# Patient Record
Sex: Female | Born: 1965
Health system: Southern US, Community
[De-identification: ages and names within clinical notes are randomized; demographics above are authoritative.]

## PROBLEM LIST (undated history)

## (undated) DIAGNOSIS — M797 Fibromyalgia: Secondary | ICD-10-CM

## (undated) DIAGNOSIS — Z8639 Personal history of other endocrine, nutritional and metabolic disease: Secondary | ICD-10-CM

## (undated) DIAGNOSIS — L309 Dermatitis, unspecified: Secondary | ICD-10-CM

## (undated) DIAGNOSIS — R0600 Dyspnea, unspecified: Secondary | ICD-10-CM

## (undated) DIAGNOSIS — R739 Hyperglycemia, unspecified: Secondary | ICD-10-CM

## (undated) DIAGNOSIS — N979 Female infertility, unspecified: Secondary | ICD-10-CM

## (undated) DIAGNOSIS — E079 Disorder of thyroid, unspecified: Secondary | ICD-10-CM

## (undated) DIAGNOSIS — F419 Anxiety disorder, unspecified: Secondary | ICD-10-CM

## (undated) DIAGNOSIS — D649 Anemia, unspecified: Secondary | ICD-10-CM

## (undated) DIAGNOSIS — K219 Gastro-esophageal reflux disease without esophagitis: Secondary | ICD-10-CM

## (undated) HISTORY — DX: Disorder of thyroid, unspecified: E07.9

## (undated) HISTORY — DX: Anxiety disorder, unspecified: F41.9

## (undated) HISTORY — DX: Hyperglycemia, unspecified: R73.9

## (undated) HISTORY — DX: Dyspnea, unspecified: R06.00

## (undated) HISTORY — DX: Female infertility, unspecified: N97.9

## (undated) HISTORY — DX: Dermatitis, unspecified: L30.9

## (undated) HISTORY — DX: Personal history of other endocrine, nutritional and metabolic disease: Z86.39

## (undated) HISTORY — DX: Gastro-esophageal reflux disease without esophagitis: K21.9

## (undated) HISTORY — DX: Fibromyalgia: M79.7

## (undated) HISTORY — DX: Anemia, unspecified: D64.9

---

## 1997-01-05 HISTORY — PX: PELVIC LAPAROSCOPY: SHX162

## 1997-10-28 ENCOUNTER — Emergency Department (HOSPITAL_COMMUNITY): Admission: EM | Admit: 1997-10-28 | Discharge: 1997-10-28 | Payer: Self-pay | Admitting: Family Medicine

## 1997-11-27 ENCOUNTER — Emergency Department (HOSPITAL_COMMUNITY): Admission: EM | Admit: 1997-11-27 | Discharge: 1997-11-27 | Payer: Self-pay | Admitting: Emergency Medicine

## 1997-11-27 ENCOUNTER — Encounter: Payer: Self-pay | Admitting: Emergency Medicine

## 1998-01-31 ENCOUNTER — Emergency Department (HOSPITAL_COMMUNITY): Admission: EM | Admit: 1998-01-31 | Discharge: 1998-01-31 | Payer: Self-pay | Admitting: Emergency Medicine

## 1998-03-18 ENCOUNTER — Ambulatory Visit (HOSPITAL_COMMUNITY): Admission: RE | Admit: 1998-03-18 | Discharge: 1998-03-18 | Payer: Self-pay | Admitting: Family Medicine

## 2000-02-15 ENCOUNTER — Emergency Department (HOSPITAL_COMMUNITY): Admission: EM | Admit: 2000-02-15 | Discharge: 2000-02-15 | Payer: Self-pay | Admitting: Emergency Medicine

## 2000-02-16 ENCOUNTER — Other Ambulatory Visit: Admission: RE | Admit: 2000-02-16 | Discharge: 2000-02-16 | Payer: Self-pay | Admitting: Obstetrics & Gynecology

## 2000-03-16 ENCOUNTER — Ambulatory Visit (HOSPITAL_COMMUNITY): Admission: RE | Admit: 2000-03-16 | Discharge: 2000-03-16 | Payer: Self-pay | Admitting: Interventional Radiology

## 2000-04-08 ENCOUNTER — Ambulatory Visit (HOSPITAL_COMMUNITY): Admission: RE | Admit: 2000-04-08 | Discharge: 2000-04-08 | Payer: Self-pay | Admitting: Obstetrics & Gynecology

## 2000-04-08 ENCOUNTER — Encounter: Payer: Self-pay | Admitting: Obstetrics & Gynecology

## 2001-01-05 ENCOUNTER — Emergency Department (HOSPITAL_COMMUNITY): Admission: EM | Admit: 2001-01-05 | Discharge: 2001-01-05 | Payer: Self-pay | Admitting: *Deleted

## 2001-01-05 ENCOUNTER — Encounter: Payer: Self-pay | Admitting: Emergency Medicine

## 2001-04-11 ENCOUNTER — Ambulatory Visit: Admission: RE | Admit: 2001-04-11 | Discharge: 2001-04-11 | Payer: Self-pay | Admitting: Pulmonary Disease

## 2001-07-20 ENCOUNTER — Encounter: Payer: Self-pay | Admitting: Emergency Medicine

## 2001-07-20 ENCOUNTER — Emergency Department (HOSPITAL_COMMUNITY): Admission: EM | Admit: 2001-07-20 | Discharge: 2001-07-21 | Payer: Self-pay | Admitting: Emergency Medicine

## 2002-01-05 HISTORY — PX: ABDOMINAL HYSTERECTOMY: SHX81

## 2002-10-02 ENCOUNTER — Other Ambulatory Visit: Admission: RE | Admit: 2002-10-02 | Discharge: 2002-10-02 | Payer: Self-pay | Admitting: Internal Medicine

## 2003-03-19 ENCOUNTER — Other Ambulatory Visit: Admission: RE | Admit: 2003-03-19 | Discharge: 2003-03-19 | Payer: Self-pay | Admitting: Obstetrics & Gynecology

## 2003-09-06 ENCOUNTER — Encounter: Admission: RE | Admit: 2003-09-06 | Discharge: 2003-09-06 | Payer: Self-pay | Admitting: Obstetrics & Gynecology

## 2004-01-17 ENCOUNTER — Ambulatory Visit: Payer: Self-pay | Admitting: Internal Medicine

## 2004-02-06 ENCOUNTER — Ambulatory Visit: Payer: Self-pay | Admitting: Critical Care Medicine

## 2004-02-18 ENCOUNTER — Ambulatory Visit: Payer: Self-pay | Admitting: Critical Care Medicine

## 2004-02-27 ENCOUNTER — Ambulatory Visit: Payer: Self-pay | Admitting: Internal Medicine

## 2004-03-10 ENCOUNTER — Ambulatory Visit: Payer: Self-pay | Admitting: Critical Care Medicine

## 2004-06-04 ENCOUNTER — Ambulatory Visit: Payer: Self-pay | Admitting: Internal Medicine

## 2004-08-18 ENCOUNTER — Ambulatory Visit (HOSPITAL_COMMUNITY): Admission: RE | Admit: 2004-08-18 | Discharge: 2004-08-18 | Payer: Self-pay | Admitting: Gastroenterology

## 2004-10-14 ENCOUNTER — Ambulatory Visit: Payer: Self-pay | Admitting: Internal Medicine

## 2004-10-23 ENCOUNTER — Ambulatory Visit: Payer: Self-pay | Admitting: Internal Medicine

## 2004-10-23 ENCOUNTER — Encounter: Admission: RE | Admit: 2004-10-23 | Discharge: 2004-10-23 | Payer: Self-pay | Admitting: Internal Medicine

## 2004-10-27 ENCOUNTER — Ambulatory Visit: Payer: Self-pay | Admitting: Internal Medicine

## 2004-11-03 ENCOUNTER — Ambulatory Visit: Payer: Self-pay | Admitting: Internal Medicine

## 2004-11-04 ENCOUNTER — Encounter: Admission: RE | Admit: 2004-11-04 | Discharge: 2004-11-04 | Payer: Self-pay | Admitting: Internal Medicine

## 2004-11-10 ENCOUNTER — Ambulatory Visit: Payer: Self-pay | Admitting: Internal Medicine

## 2004-12-01 ENCOUNTER — Ambulatory Visit: Payer: Self-pay | Admitting: Critical Care Medicine

## 2005-01-12 ENCOUNTER — Ambulatory Visit: Payer: Self-pay | Admitting: Internal Medicine

## 2005-01-13 ENCOUNTER — Ambulatory Visit: Payer: Self-pay | Admitting: Internal Medicine

## 2005-05-15 ENCOUNTER — Emergency Department (HOSPITAL_COMMUNITY): Admission: EM | Admit: 2005-05-15 | Discharge: 2005-05-15 | Payer: Self-pay | Admitting: Emergency Medicine

## 2005-05-15 ENCOUNTER — Ambulatory Visit: Payer: Self-pay | Admitting: Critical Care Medicine

## 2005-05-16 ENCOUNTER — Ambulatory Visit: Payer: Self-pay | Admitting: Internal Medicine

## 2005-07-25 ENCOUNTER — Ambulatory Visit: Payer: Self-pay | Admitting: Family Medicine

## 2005-08-11 ENCOUNTER — Ambulatory Visit: Payer: Self-pay | Admitting: Internal Medicine

## 2005-09-21 ENCOUNTER — Ambulatory Visit: Payer: Self-pay | Admitting: Internal Medicine

## 2005-10-16 ENCOUNTER — Ambulatory Visit: Payer: Self-pay | Admitting: Internal Medicine

## 2005-10-19 ENCOUNTER — Ambulatory Visit: Payer: Self-pay | Admitting: Licensed Clinical Social Worker

## 2005-10-28 ENCOUNTER — Ambulatory Visit: Payer: Self-pay | Admitting: Licensed Clinical Social Worker

## 2005-11-02 ENCOUNTER — Ambulatory Visit: Payer: Self-pay | Admitting: Licensed Clinical Social Worker

## 2005-11-16 ENCOUNTER — Ambulatory Visit: Payer: Self-pay | Admitting: Licensed Clinical Social Worker

## 2005-11-23 ENCOUNTER — Ambulatory Visit: Payer: Self-pay | Admitting: Licensed Clinical Social Worker

## 2005-11-25 ENCOUNTER — Ambulatory Visit: Payer: Self-pay | Admitting: Family Medicine

## 2005-12-01 ENCOUNTER — Ambulatory Visit: Payer: Self-pay | Admitting: Licensed Clinical Social Worker

## 2005-12-09 ENCOUNTER — Ambulatory Visit: Payer: Self-pay | Admitting: Licensed Clinical Social Worker

## 2005-12-16 ENCOUNTER — Ambulatory Visit: Payer: Self-pay | Admitting: Licensed Clinical Social Worker

## 2005-12-23 ENCOUNTER — Ambulatory Visit: Payer: Self-pay | Admitting: Licensed Clinical Social Worker

## 2005-12-31 ENCOUNTER — Ambulatory Visit: Payer: Self-pay | Admitting: Licensed Clinical Social Worker

## 2006-01-11 ENCOUNTER — Ambulatory Visit: Payer: Self-pay | Admitting: Licensed Clinical Social Worker

## 2006-01-25 ENCOUNTER — Ambulatory Visit: Payer: Self-pay | Admitting: Licensed Clinical Social Worker

## 2006-01-28 ENCOUNTER — Ambulatory Visit: Payer: Self-pay | Admitting: Critical Care Medicine

## 2006-02-02 ENCOUNTER — Ambulatory Visit: Payer: Self-pay | Admitting: Internal Medicine

## 2006-02-03 ENCOUNTER — Encounter: Payer: Self-pay | Admitting: Internal Medicine

## 2006-02-04 ENCOUNTER — Encounter: Payer: Self-pay | Admitting: Critical Care Medicine

## 2006-02-04 ENCOUNTER — Ambulatory Visit: Payer: Self-pay | Admitting: Cardiology

## 2006-02-05 ENCOUNTER — Ambulatory Visit: Payer: Self-pay | Admitting: Internal Medicine

## 2006-02-17 ENCOUNTER — Encounter: Payer: Self-pay | Admitting: Internal Medicine

## 2006-03-02 ENCOUNTER — Encounter: Payer: Self-pay | Admitting: Internal Medicine

## 2006-05-05 ENCOUNTER — Ambulatory Visit: Payer: Self-pay | Admitting: Internal Medicine

## 2006-10-21 ENCOUNTER — Ambulatory Visit: Payer: Self-pay | Admitting: Internal Medicine

## 2006-10-21 DIAGNOSIS — Z860100 Personal history of colon polyps, unspecified: Secondary | ICD-10-CM | POA: Insufficient documentation

## 2006-10-21 DIAGNOSIS — R0989 Other specified symptoms and signs involving the circulatory and respiratory systems: Secondary | ICD-10-CM | POA: Insufficient documentation

## 2006-10-21 DIAGNOSIS — R0609 Other forms of dyspnea: Secondary | ICD-10-CM | POA: Insufficient documentation

## 2006-11-20 ENCOUNTER — Ambulatory Visit: Payer: Self-pay | Admitting: Family Medicine

## 2006-11-20 LAB — CONVERTED CEMR LAB
Glucose, Urine, Semiquant: NEGATIVE
Nitrite: NEGATIVE
Urobilinogen, UA: 1
pH: 6.5

## 2007-02-25 ENCOUNTER — Telehealth: Payer: Self-pay | Admitting: Internal Medicine

## 2007-03-03 ENCOUNTER — Encounter: Payer: Self-pay | Admitting: Internal Medicine

## 2007-04-06 ENCOUNTER — Encounter: Payer: Self-pay | Admitting: Internal Medicine

## 2007-06-09 ENCOUNTER — Ambulatory Visit: Payer: Self-pay | Admitting: Internal Medicine

## 2007-06-09 LAB — CONVERTED CEMR LAB
Glucose, Urine, Semiquant: NEGATIVE
Protein, U semiquant: NEGATIVE
WBC Urine, dipstick: NEGATIVE

## 2007-09-20 ENCOUNTER — Ambulatory Visit: Payer: Self-pay | Admitting: Critical Care Medicine

## 2007-09-20 DIAGNOSIS — J8410 Pulmonary fibrosis, unspecified: Secondary | ICD-10-CM | POA: Insufficient documentation

## 2007-09-22 ENCOUNTER — Encounter: Payer: Self-pay | Admitting: Critical Care Medicine

## 2007-09-22 ENCOUNTER — Ambulatory Visit: Payer: Self-pay | Admitting: Cardiology

## 2007-09-23 ENCOUNTER — Encounter: Payer: Self-pay | Admitting: Critical Care Medicine

## 2007-09-27 ENCOUNTER — Ambulatory Visit: Payer: Self-pay | Admitting: Critical Care Medicine

## 2007-09-28 ENCOUNTER — Encounter: Payer: Self-pay | Admitting: Critical Care Medicine

## 2007-09-28 ENCOUNTER — Ambulatory Visit: Payer: Self-pay | Admitting: Internal Medicine

## 2007-09-29 ENCOUNTER — Telehealth: Payer: Self-pay | Admitting: Internal Medicine

## 2007-09-29 ENCOUNTER — Telehealth (INDEPENDENT_AMBULATORY_CARE_PROVIDER_SITE_OTHER): Payer: Self-pay | Admitting: *Deleted

## 2007-09-29 ENCOUNTER — Encounter: Admission: RE | Admit: 2007-09-29 | Discharge: 2007-09-29 | Payer: Self-pay | Admitting: Internal Medicine

## 2007-09-30 ENCOUNTER — Encounter: Payer: Self-pay | Admitting: Internal Medicine

## 2007-10-04 LAB — CONVERTED CEMR LAB
Basophils Absolute: 0 10*3/uL (ref 0.0–0.1)
Basophils Relative: 0.7 % (ref 0.0–3.0)
Eosinophils Absolute: 0.5 10*3/uL (ref 0.0–0.7)
MCHC: 34.4 g/dL (ref 30.0–36.0)
Monocytes Absolute: 0.5 10*3/uL (ref 0.1–1.0)
Monocytes Relative: 9 % (ref 3.0–12.0)
RDW: 12.3 % (ref 11.5–14.6)
TSH: 0.64 microintl units/mL (ref 0.35–5.50)
WBC: 5.9 10*3/uL (ref 4.5–10.5)

## 2007-10-18 ENCOUNTER — Ambulatory Visit: Payer: Self-pay | Admitting: Family Medicine

## 2007-10-18 LAB — CONVERTED CEMR LAB
Bilirubin Urine: NEGATIVE
Glucose, Urine, Semiquant: NEGATIVE
Ketones, urine, test strip: NEGATIVE
Nitrite: NEGATIVE
Urobilinogen, UA: 0.2
WBC Urine, dipstick: NEGATIVE
pH: 7

## 2007-10-19 ENCOUNTER — Telehealth: Payer: Self-pay | Admitting: Family Medicine

## 2007-10-19 ENCOUNTER — Ambulatory Visit: Payer: Self-pay | Admitting: Family Medicine

## 2007-10-20 ENCOUNTER — Encounter: Admission: RE | Admit: 2007-10-20 | Discharge: 2007-12-05 | Payer: Self-pay | Admitting: Internal Medicine

## 2007-10-20 ENCOUNTER — Encounter: Payer: Self-pay | Admitting: Internal Medicine

## 2007-10-20 LAB — CONVERTED CEMR LAB
Glucose, 1 Hour GTT: 149 mg/dL (ref 120–170)
Glucose, 2 hour: 98 mg/dL (ref 70–139)
Glucose, Fasting: 100 mg/dL — ABNORMAL HIGH (ref 70–99)
Glucose, GTT - 3 Hour: 108 mg/dL

## 2007-11-09 ENCOUNTER — Ambulatory Visit: Payer: Self-pay | Admitting: Internal Medicine

## 2007-11-09 LAB — CONVERTED CEMR LAB
Glucose, Urine, Semiquant: NEGATIVE
Nitrite: NEGATIVE
Specific Gravity, Urine: <1.005
Urobilinogen, UA: 0.2
pH: 5

## 2007-11-22 ENCOUNTER — Encounter: Payer: Self-pay | Admitting: Internal Medicine

## 2007-11-25 ENCOUNTER — Ambulatory Visit: Payer: Self-pay | Admitting: Family Medicine

## 2007-11-25 ENCOUNTER — Telehealth: Payer: Self-pay | Admitting: Family Medicine

## 2007-11-25 LAB — CONVERTED CEMR LAB
Bilirubin Urine: NEGATIVE
Ketones, urine, test strip: NEGATIVE
Specific Gravity, Urine: 1.01

## 2008-01-02 ENCOUNTER — Telehealth: Payer: Self-pay | Admitting: Internal Medicine

## 2008-01-07 ENCOUNTER — Ambulatory Visit: Payer: Self-pay | Admitting: Internal Medicine

## 2008-01-07 LAB — CONVERTED CEMR LAB
Nitrite: NEGATIVE
Protein, U semiquant: NEGATIVE
pH: 5

## 2008-01-08 ENCOUNTER — Emergency Department (HOSPITAL_COMMUNITY): Admission: EM | Admit: 2008-01-08 | Discharge: 2008-01-08 | Payer: Self-pay | Admitting: Emergency Medicine

## 2008-01-11 ENCOUNTER — Ambulatory Visit: Payer: Self-pay | Admitting: Internal Medicine

## 2008-01-11 DIAGNOSIS — K219 Gastro-esophageal reflux disease without esophagitis: Secondary | ICD-10-CM

## 2008-01-11 HISTORY — DX: Gastro-esophageal reflux disease without esophagitis: K21.9

## 2008-01-12 ENCOUNTER — Telehealth: Payer: Self-pay | Admitting: Internal Medicine

## 2008-01-18 ENCOUNTER — Ambulatory Visit: Payer: Self-pay | Admitting: Internal Medicine

## 2008-01-18 DIAGNOSIS — M797 Fibromyalgia: Secondary | ICD-10-CM

## 2008-01-18 HISTORY — DX: Fibromyalgia: M79.7

## 2008-01-18 LAB — CONVERTED CEMR LAB
Albumin: 4.4 g/dL (ref 3.5–5.2)
Alkaline Phosphatase: 49 units/L (ref 39–117)
BUN: 7 mg/dL (ref 6–23)
CO2: 31 meq/L (ref 19–32)
Eosinophils Absolute: 0.4 10*3/uL (ref 0.0–0.7)
Eosinophils Relative: 7.2 % — ABNORMAL HIGH (ref 0.0–5.0)
GFR calc Af Amer: 102 mL/min
Glucose, Bld: 94 mg/dL (ref 70–99)
HCT: 38.6 % (ref 36.0–46.0)
Hemoglobin: 13 g/dL (ref 12.0–15.0)
Lymphocytes Relative: 25.4 % (ref 12.0–46.0)
MCHC: 33.7 g/dL (ref 30.0–36.0)
Monocytes Relative: 9.6 % (ref 3.0–12.0)
Neutro Abs: 3 10*3/uL (ref 1.4–7.7)
Platelets: 177 10*3/uL (ref 150–400)
Potassium: 4 meq/L (ref 3.5–5.1)
RBC: 4.4 M/uL (ref 3.87–5.11)
Rhuematoid fact SerPl-aCnc: <20 intl units/mL — ABNORMAL LOW (ref 0.0–20.0)
Sodium: 139 meq/L (ref 135–145)
Total Protein: 7.9 g/dL (ref 6.0–8.3)
WBC: 5.2 10*3/uL (ref 4.5–10.5)

## 2008-01-20 ENCOUNTER — Encounter: Payer: Self-pay | Admitting: Internal Medicine

## 2008-01-23 ENCOUNTER — Telehealth: Payer: Self-pay | Admitting: Internal Medicine

## 2008-01-24 ENCOUNTER — Ambulatory Visit: Payer: Self-pay | Admitting: Internal Medicine

## 2008-01-30 ENCOUNTER — Telehealth: Payer: Self-pay | Admitting: Internal Medicine

## 2008-04-20 ENCOUNTER — Ambulatory Visit: Payer: Self-pay | Admitting: Internal Medicine

## 2008-04-20 LAB — CONVERTED CEMR LAB
BUN: 8 mg/dL (ref 6–23)
Calcium: 9.9 mg/dL (ref 8.4–10.5)
Folate: 11.3 ng/mL
GFR calc non Af Amer: 70.44 mL/min (ref >60)
Glucose, Bld: 112 mg/dL — ABNORMAL HIGH (ref 70–99)
Vitamin B-12: 1324 pg/mL — ABNORMAL HIGH (ref 211–911)

## 2008-04-24 ENCOUNTER — Ambulatory Visit: Payer: Self-pay | Admitting: Internal Medicine

## 2008-04-26 ENCOUNTER — Telehealth: Payer: Self-pay | Admitting: Internal Medicine

## 2008-06-01 ENCOUNTER — Telehealth: Payer: Self-pay | Admitting: Internal Medicine

## 2008-06-06 ENCOUNTER — Telehealth: Payer: Self-pay | Admitting: Internal Medicine

## 2010-01-26 ENCOUNTER — Encounter: Payer: Self-pay | Admitting: Rheumatology

## 2010-03-06 LAB — HM MAMMOGRAPHY: HM Mammogram: NEGATIVE

## 2010-04-21 LAB — DIFFERENTIAL
Basophils Relative: 1 % (ref 0–1)
Monocytes Absolute: 0.5 10*3/uL (ref 0.1–1.0)
Monocytes Relative: 6 % (ref 3–12)
Neutro Abs: 5.2 10*3/uL (ref 1.7–7.7)

## 2010-04-21 LAB — CBC
HCT: 40.1 % (ref 36.0–46.0)
Hemoglobin: 13.6 g/dL (ref 12.0–15.0)
MCHC: 33.8 g/dL (ref 30.0–36.0)
MCV: 86.5 fL (ref 78.0–100.0)
Platelets: 172 10*3/uL (ref 150–400)
RDW: 13.5 % (ref 11.5–15.5)

## 2010-04-21 LAB — POCT CARDIAC MARKERS
CKMB, poc: <1 ng/mL — ABNORMAL LOW (ref 1.0–8.0)
Myoglobin, poc: 47.5 ng/mL (ref 12–200)
Troponin i, poc: <0.05 ng/mL (ref 0.00–0.09)

## 2010-04-21 LAB — BASIC METABOLIC PANEL
BUN: 12 mg/dL (ref 6–23)
CO2: 25 mEq/L (ref 19–32)
GFR calc Af Amer: >60 mL/min (ref >60)
Glucose, Bld: 99 mg/dL (ref 70–99)
Sodium: 138 mEq/L (ref 135–145)

## 2010-04-21 LAB — D-DIMER, QUANTITATIVE: D-Dimer, Quant: <0.22 ug/mL-FEU (ref 0.00–0.48)

## 2010-05-23 NOTE — Letter (Signed)
February 05, 2006    [facility]  [address]  [place], [place] [address]   RE:  Donna Blankenship, Donna Blankenship  MRN:  [id number]  /  DOB:  [birth year]   Dear [name]:   I was unable to reach you by phone, therefore, this letter is being sent  to notify of your CT scan chest result.  This revealed minimal changes  with scarring in the lower part of your lungs, but has not changed since  2006.  At this point, this information coupled with the pulmonary  function study suggest that there is not significant progression in your  condition, and no medications are recommended or changes in medicines  are recommended.  If you have further questions, please do not hesitate  to contact me.    Sincerely,      Charlcie Cradle. Delford Field, MD, [facility]  Electronically Signed    PEW/MedQ  DD: 02/05/2006  DT: 02/05/2006  Job #: [id number]

## 2010-05-23 NOTE — Op Note (Signed)
Donna Blankenship, Donna Blankenship                ACCOUNT NO.:  [id number]   MEDICAL RECORD NO.:  [id number]          PATIENT TYPE:  AMB   LOCATION:  ENDO                         FACILITY:  [facility]   PHYSICIAN:  Anselmo Rod, M.D.  DATE OF BIRTH:  Feb 27, 1965   DATE OF PROCEDURE:  08/18/2004  DATE OF DISCHARGE:                                 OPERATIVE REPORT   PROCEDURE PERFORMED:  Screening colonoscopy.   ENDOSCOPIST:  Anselmo Rod, M.D.   INSTRUMENT USED:  Olympus video colonoscope.   INDICATIONS FOR PROCEDURE:  A 45 year old African-American female with  history of chronic constipation undergoing colonoscopy.  The patient has a  history adenomatous polyps removed in the past.  Rule out recurrent polyps.   PREPROCEDURE PREPARATION:  Informed consent was procured from the patient.  The patient was fasted for eight hours prior to procedure and prepped with a  bottle of magnesium citrate and gallon of GoLYTELY the night prior to  procedure. Risks and benefits of the procedure including a 10% risk rate of  cancer were discussed with the patient as well.   PREPROCEDURE PHYSICAL:  VITAL SIGNS:  The patient has stable vital signs.  NECK:  Supple.  CHEST:  Clear to auscultation.  CARDIAC:  S1, S2 regular.  ABDOMEN:  Soft with normal bowel sounds.   DESCRIPTION OF PROCEDURE:  The patient was placed in the left lateral  decubitus position and sedated with 80 mg of Demerol and 8 mg of Versed in  slow incremental doses.  Once the patient was adequately sedated and  maintained on low-flow oxygen and continuous cardiac monitoring, the Olympus  video colonoscope was advanced from the rectum to the cecum.  The  appendicular orifice and ileocecal valve were visualized and photographed.  There was a large amount of residual stool in the colon.  Multiple washes  were done.  No masses, polyps, ulcerations, or diverticula were seen.  Small  internal hemorrhoids were appreciated on  retroflexion of the  rectum.  The  patient tolerated the procedure without complication.   IMPRESSION:  1.  Normal colonoscopy of the cecum except for diffuse melanosis coli      throughout the colon.  2.  Small nonbleeding internal hemorrhoids.  3.  No masses, polyps, or diverticula seen.   RECOMMENDATIONS:  1.  Repeat colonoscopy recommended in the next years unless the patient      develops any abnormal symptoms in      the interim.  2.  Outpatient followup as need arises in the future.  3.  Continue high fiber diet with liberal fluid intake.      Anselmo Rod, M.D.  Electronically Signed     JNM/MEDQ  D:  08/18/2004  T:  08/18/2004  Job:  [id number]   cc:   Neta Mends. Fabian Sharp, M.D. [facility]  [address] [place]  [place] [address]

## 2010-05-23 NOTE — Assessment & Plan Note (Signed)
[facility]                             PULMONARY OFFICE NOTE   NAME:Donna Blankenship, Donna Blankenship                       MRN:          [id number]  DATE:01/28/2006                            DOB:          1965-08-25    HISTORY OF PRESENT ILLNESS:  Ms. Hunnell returns today in followup. She is  a 45 year old African-American female with history of systemic lupus  erythematosus. She has not had systemic pulmonary involvement in the  past. She currently has no complaints of wheezing, cough, or shortness  of breath at this time. She is getting over an upper respiratory tract  infection at this time. She maintains Lexapro 10 mg daily, thyroid  supplementation 5 mg daily, and multivitamin daily.   PHYSICAL EXAMINATION:  VITAL SIGNS:  Temperature 98, blood pressure  118/80, pulse 75, saturation was 985 on room air.  CHEST:  Clear without evidence of wheeze, rale, or rhonchi.  CARDIAC:  Regular rate and rhythm without S3. Normal S1 and S2.  ABDOMEN:  Soft, nontender.   DIAGNOSTIC STUDIES:  Pulmonary functions were obtained and reviewed and  showed normal spirometry. Diffusion capacity, though, was reduced at 63%  predicted. Total lung capacity was normal at 84% predicted. When  compared to values obtained in 2006, there is a reduction in diffusion  capacity from 18.2 down to 14.6 ml per mm of mmHg per minute.   IMPRESSION:  Mild reduction in diffusion capacity but essentially normal  examination. Evaluate for interstitial change secondary to systemic  lupus erythematosus.   PLAN:  Obtain CT scan of the chest, high resolution on February 01, 2006.  Once the results of this are available, further recommendations will  follow.     Charlcie Cradle Delford Field, MD, [facility]  Electronically Signed    PEW/MedQ  DD: 01/28/2006  DT: 01/28/2006  Job #: [id number]   cc:   Neta Mends. Fabian Sharp, MD

## 2011-07-07 ENCOUNTER — Ambulatory Visit (INDEPENDENT_AMBULATORY_CARE_PROVIDER_SITE_OTHER): Payer: BC Managed Care – PPO | Admitting: Family

## 2011-07-07 ENCOUNTER — Encounter: Payer: Self-pay | Admitting: Family

## 2011-07-07 VITALS — BP 120/80 | Ht 66.0 in | Wt 159.9 lb

## 2011-07-07 DIAGNOSIS — G4700 Insomnia, unspecified: Secondary | ICD-10-CM

## 2011-07-07 DIAGNOSIS — Z0000 Encounter for general adult medical examination without abnormal findings: Secondary | ICD-10-CM

## 2011-07-07 DIAGNOSIS — Z8639 Personal history of other endocrine, nutritional and metabolic disease: Secondary | ICD-10-CM

## 2011-07-07 DIAGNOSIS — E559 Vitamin D deficiency, unspecified: Secondary | ICD-10-CM

## 2011-07-07 DIAGNOSIS — Z862 Personal history of diseases of the blood and blood-forming organs and certain disorders involving the immune mechanism: Secondary | ICD-10-CM

## 2011-07-07 NOTE — Progress Notes (Signed)
Subjective:    Patient ID: Donna Blankenship, female    DOB: 09/18/1965, 46 y.o.   MRN: [id number]  HPI  This is a routine physical examination for this healthy  female. Reviewed all health maintenance protocols including mammography colonoscopy bone density and reviewed appropriate screening labs. Her immunization history was reviewed as well as her current medications and allergies refills of her chronic medications were given and the plan for yearly health maintenance was discussed all orders and referrals were made as appropriate.  Review of Systems  Constitutional: Positive for unexpected weight change (Reports 20 lbs weight gain with menopause).  HENT: Negative.   Eyes: Negative.   Respiratory: Negative for cough, shortness of breath and wheezing.   Cardiovascular: Negative for chest pain, palpitations and leg swelling.  Gastrointestinal: Negative.   Genitourinary: Positive for frequency (For the past few years, worked up by urologist in [place] who recommended Vesicare, pt decided she did not want it managed with medication at this time). Negative for hematuria, flank pain, decreased urine volume, difficulty urinating and pelvic pain.  Musculoskeletal: Negative.   Skin: Negative.   Neurological: Negative.   Hematological: Negative.   Psychiatric/Behavioral: Positive for disturbed wake/sleep cycle (Difficulty falling asleep, many life changes right now including losing her job and having to care for her aging parents).   No past medical history on file.  History   Social History  . Marital Status: Married    Spouse Name: N/A    Number of Children: N/A  . Years of Education: N/A   Occupational History  . Not on file.   Social History Main Topics  . Smoking status: Never Smoker   . Smokeless tobacco: Not on file  . Alcohol Use: Yes     rarely  . Drug Use: No  . Sexually Active: Not on file   Other Topics Concern  . Not on file   Social History Narrative  . No  narrative on file    No past surgical history on file.  Family History  Problem Relation Age of Onset  . Diabetes Mother   . Diabetes Father   . Stroke Father   . Anuerysm Father   . Sudden death Brother   . Heart attack Brother   . Diabetes Paternal Grandmother     Allergies  Allergen Reactions  . Sulfamethoxazole     REACTION: unspecified    Current Outpatient Prescriptions on File Prior to Visit  Medication Sig Dispense Refill  . ESZOPICLONE 3 MG tablet Take 3 mg by mouth at bedtime.         BP 120/80  Ht 5\' 6"  (1.676 m)  Wt 159 lb 14.4 oz (72.53 kg)  BMI 25.81 kg/m2chart    Objective:   Physical Exam  Constitutional: She is oriented to person, place, and time. She appears well-developed and well-nourished.  HENT:  Head: Normocephalic.  Right Ear: External ear normal.  Left Ear: External ear normal.  Nose: Nose normal.  Mouth/Throat: Oropharynx is clear and moist. No oropharyngeal exudate.  Eyes: Conjunctivae and EOM are normal. Pupils are equal, round, and reactive to light. Right eye exhibits no discharge. Left eye exhibits no discharge. No scleral icterus.  Neck: Normal range of motion. Neck supple. No JVD present. No thyromegaly present.  Cardiovascular: Normal rate, regular rhythm, normal heart sounds and intact distal pulses.  Exam reveals no gallop and no friction rub.   No murmur heard. Pulmonary/Chest: Effort normal and breath sounds normal. No respiratory distress. She  has no wheezes. She has no rales. She exhibits no tenderness.  Abdominal: Soft. Bowel sounds are normal. She exhibits no distension and no mass. There is no tenderness. There is no rebound and no guarding.  Genitourinary:       Deferred at this time, vaginal exam performed by gynecologist in March 2013  Musculoskeletal: Normal range of motion. She exhibits no edema and no tenderness.  Lymphadenopathy:    She has no cervical adenopathy.  Neurological: She is alert and oriented to person,  place, and time. She has normal reflexes. No cranial nerve deficit. Coordination normal.  Skin: Skin is warm and dry. No rash noted. No erythema. No pallor.  Psychiatric: She has a normal mood and affect. Her behavior is normal. Thought content normal.    EKG: EKG WNL except non-specific ST waves       Assessment & Plan:  Assessment: Completed Physical Exam, History of hyperglycemia, Vitamin D deficiency, Insomnia  Plan: Labs sent to include CBC, BMP, lipid panel, TSH, Vitamin D, Calcium, and HbA1c. Will notify patient pending results. Given her extensive cardiac family history will refer to cardiology for further work up of inverted T waves noted on EKG. Continue Lunesta prn for insomnia. Will follow up as needed.

## 2011-07-07 NOTE — Patient Instructions (Signed)
Hyperglycemia  Hyperglycemia occurs when the glucose (sugar) in your blood is too high. Hyperglycemia can happen for many reasons, but it most often happens to people who do not know they have diabetes or are not managing their diabetes properly.   CAUSES   Whether you have diabetes or not, there are other causes of hyperglycemia. Hyperglycemia can occur when you have diabetes, but it can also occur in other situations that you might not be as aware of, such as:  Diabetes  · If you have diabetes and are having problems controlling your blood glucose, hyperglycemia could occur because of some of the following reasons:  · Not following your meal plan.  · Not taking your diabetes medications or not taking it properly.  · Exercising less or doing less activity than you normally do.  · Being sick.  Pre-diabetes  · This cannot be ignored. Before people develop Type 2 diabetes, they almost always have "pre-diabetes." This is when your blood glucose levels are higher than normal, but not yet high enough to be diagnosed as diabetes. Research has shown that some long-term damage to the body, especially the heart and circulatory system, may already be occurring during pre-diabetes. If you take action to manage your blood glucose when you have pre-diabetes, you may delay or prevent Type 2 diabetes from developing.  Stress  · If you have diabetes, you may be "diet" controlled or on oral medications or insulin to control your diabetes. However, you may find that your blood glucose is higher than usual in the hospital whether you have diabetes or not. This is often referred to as "stress hyperglycemia." Stress can elevate your blood glucose. This happens because of hormones put out by the body during times of stress. If stress has been the cause of your high blood glucose, it can be followed regularly by your caregiver. That way he/she can make sure your hyperglycemia does not continue to get worse or progress to  diabetes.  Steroids  · Steroids are medications that act on the infection fighting system (immune system) to block inflammation or infection. One side effect can be a rise in blood glucose. Most people can produce enough extra insulin to allow for this rise, but for those who cannot, steroids make blood glucose levels go even higher. It is not unusual for steroid treatments to "uncover" diabetes that is developing. It is not always possible to determine if the hyperglycemia will go away after the steroids are stopped. A special blood test called an A1c is sometimes done to determine if your blood glucose was elevated before the steroids were started.  SYMPTOMS  · Thirsty.  · Frequent urination.  · Dry mouth.  · Blurred vision.  · Tired or fatigue.  · Weakness.  · Sleepy.  · Tingling in feet or leg.  DIAGNOSIS   Diagnosis is made by monitoring blood glucose in one or all of the following ways:  · A1c test. This is a chemical found in your blood.  · Fingerstick blood glucose monitoring.  · Laboratory results.  TREATMENT   First, knowing the cause of the hyperglycemia is important before the hyperglycemia can be treated. Treatment may include, but is not be limited to:  · Education.  · Change or adjustment in medications.  · Change or adjustment in meal plan.  · Treatment for an illness, infection, etc.  · More frequent blood glucose monitoring.  · Change in exercise plan.  · Decreasing or stopping steroids.  ·   Lifestyle changes.  HOME CARE INSTRUCTIONS   · Test your blood glucose as directed.  · Exercise regularly. Your caregiver will give you instructions about exercise. Pre-diabetes or diabetes which comes on with stress is helped by exercising.  · Eat wholesome, balanced meals. Eat often and at regular, fixed times. Your caregiver or nutritionist will give you a meal plan to guide your sugar intake.  · Being at an ideal weight is important. If needed, losing as little as 10 to 15 pounds may help improve blood  glucose levels.  SEEK MEDICAL CARE IF:   · You have questions about medicine, activity, or diet.  · You continue to have symptoms (problems such as increased thirst, urination, or weight gain).  SEEK IMMEDIATE MEDICAL CARE IF:   · You are vomiting or have diarrhea.  · Your breath smells fruity.  · You are breathing faster or slower.  · You are very sleepy or incoherent.  · You have numbness, tingling, or pain in your feet or hands.  · You have chest pain.  · Your symptoms get worse even though you have been following your caregiver's orders.  · If you have any other questions or concerns.  Document Released: 06/17/2000 Document Revised: 12/11/2010 Document Reviewed: 08/13/2008  [employer]® Patient Information ©2012 [employer].

## 2011-07-13 ENCOUNTER — Telehealth: Payer: Self-pay | Admitting: Family

## 2011-07-13 NOTE — Telephone Encounter (Signed)
Left message for pt to call back  °

## 2011-07-13 NOTE — Telephone Encounter (Signed)
Pt requested that labs be sent to [facility]. Pryor Curia is calling to see if they are available to be faxed to office

## 2011-07-13 NOTE — Telephone Encounter (Signed)
Patient did not have labs drawn with [facility]. Please schedule lab only appointment

## 2011-07-15 NOTE — Telephone Encounter (Signed)
Letter mailed to notify pt of attempts to contact her

## 2011-07-15 NOTE — Telephone Encounter (Signed)
Per Donna Blankenship, spoke with [facility] and was notified that their courier never picked blood work up. Donna Blankenship found the samples still in the lab box, therefore, it was concluded that Donna Blankenship should go to a [facility] location to have labs drawn because this is not the first time that [facility] couriers have not come to pick up labs.    Left another message for Donna Blankenship to call back

## 2011-07-16 ENCOUNTER — Ambulatory Visit: Payer: BC Managed Care – PPO | Admitting: Family

## 2011-08-12 ENCOUNTER — Ambulatory Visit: Payer: Self-pay | Admitting: Internal Medicine

## 2011-09-15 ENCOUNTER — Other Ambulatory Visit: Payer: Self-pay | Admitting: Family

## 2011-09-16 LAB — CBC WITH DIFFERENTIAL/PLATELET
Basophils Absolute: 0.1 10*3/uL (ref 0.0–0.1)
Basophils Relative: 1 % (ref 0–1)
MCHC: 32.7 g/dL (ref 30.0–36.0)
Monocytes Absolute: 0.4 10*3/uL (ref 0.1–1.0)
Neutro Abs: 2.7 10*3/uL (ref 1.7–7.7)
Neutrophils Relative %: 52 % (ref 43–77)
Platelets: 188 10*3/uL (ref 150–400)
RDW: 14.6 % (ref 11.5–15.5)

## 2011-09-16 LAB — BASIC METABOLIC PANEL
BUN: 14 mg/dL (ref 6–23)
Potassium: 4.4 mEq/L (ref 3.5–5.3)
Sodium: 139 mEq/L (ref 135–145)

## 2011-09-16 LAB — LIPID PANEL
Cholesterol: 193 mg/dL (ref 0–200)
HDL: 68 mg/dL (ref >39)
Triglycerides: 39 mg/dL (ref <150)
VLDL: 8 mg/dL (ref 0–40)

## 2011-09-25 ENCOUNTER — Encounter: Payer: Self-pay | Admitting: Family

## 2011-09-25 ENCOUNTER — Ambulatory Visit (INDEPENDENT_AMBULATORY_CARE_PROVIDER_SITE_OTHER): Payer: BC Managed Care – PPO | Admitting: Family

## 2011-09-25 VITALS — BP 118/74 | HR 76 | Temp 98.0°F | Wt 156.0 lb

## 2011-09-25 DIAGNOSIS — R7309 Other abnormal glucose: Secondary | ICD-10-CM

## 2011-09-25 DIAGNOSIS — R739 Hyperglycemia, unspecified: Secondary | ICD-10-CM

## 2011-09-25 LAB — HEMOGLOBIN A1C: Hgb A1c MFr Bld: 5.4 % (ref 4.6–6.5)

## 2011-09-25 MED ORDER — GLUCOSE BLOOD VI STRP: ORAL_STRIP | Status: DC

## 2011-09-25 NOTE — Progress Notes (Signed)
  Subjective:    Patient ID: Donna Blankenship, female    DOB: 1965/07/22, 46 y.o.   MRN: [id number]  HPI 46 year old [place] American female is in to discuss labs. She has a question about her eosinophils being elevated on her CBC. She is also requesting to have a hemoglobin A1c drawn. She checks her blood sugars at home and they're typically between 70-100. She's never been diagnosed with diabetes and is not currently taken any medications.   Review of Systems  Constitutional: Negative.   Respiratory: Negative.   Cardiovascular: Negative.   Musculoskeletal: Negative.   Skin: Negative.   Psychiatric/Behavioral: Negative.    No past medical history on file.  History   Social History  . Marital Status: Married    Spouse Name: N/A    Number of Children: N/A  . Years of Education: N/A   Occupational History  . Not on file.   Social History Main Topics  . Smoking status: Never Smoker   . Smokeless tobacco: Not on file  . Alcohol Use: Yes     rarely  . Drug Use: No  . Sexually Active: Not on file   Other Topics Concern  . Not on file   Social History Narrative  . No narrative on file    No past surgical history on file.  Family History  Problem Relation Age of Onset  . Diabetes Mother   . Diabetes Father   . Stroke Father   . Anuerysm Father   . Sudden death Brother   . Heart attack Brother   . Diabetes Paternal Grandmother     Allergies  Allergen Reactions  . Sulfamethoxazole     REACTION: unspecified    Current Outpatient Prescriptions on File Prior to Visit  Medication Sig Dispense Refill  . ESZOPICLONE 3 MG tablet Take 3 mg by mouth at bedtime.       Letta Pate DELICA LANCETS MISC       . terconazole (TERAZOL 3) 0.8 % vaginal cream         BP 118/74  Pulse 76  Temp 98 F (36.7 C) (Oral)  Wt 156 lb (70.761 kg)  SpO2 97%chart    Objective:   Physical Exam  Constitutional: She is oriented to person, place, and time. She appears well-developed and  well-nourished.  Neck: Normal range of motion. Neck supple.  Cardiovascular: Normal rate, regular rhythm and normal heart sounds.   Pulmonary/Chest: Effort normal and breath sounds normal.  Neurological: She is alert and oriented to person, place, and time.  Skin: Skin is warm and dry.  Psychiatric: She has a normal mood and affect.          Assessment & Plan:  Assessment: Hyperglycemia  Plan: At patient request, A1c sent. Will notify patient pending results. Recheck as needed and as scheduled.

## 2011-09-25 NOTE — Patient Instructions (Addendum)
Menopause Menopause is the normal time of life when menstrual periods stop completely. Menopause is complete when you have missed 12 consecutive menstrual periods. It usually occurs between the ages of 48 to 55, with an average age of 51. Very rarely does a woman develop menopause before 44 years old. At menopause, your ovaries stop producing the female hormones, estrogen and progesterone. This can cause undesirable symptoms and also affect your health. Sometimes the symptoms may occur 4 to 5 years before the menopause begins. There is no relationship between menopause and:  Oral contraceptives.   Number of children you had.   Race.   The age your menstrual periods started (menarche).  Heavy smokers and very thin women may develop menopause earlier in life. CAUSES  The ovaries stop producing the female hormones estrogen and progesterone.   Other causes include:   Surgery to remove both ovaries.   The ovaries stop functioning for no known reason.   Tumors of the pituitary gland in the brain.   Medical disease that affects the ovaries and hormone production.   Radiation treatment to the abdomen or pelvis.   Chemotherapy that affects the ovaries.  SYMPTOMS   Hot flashes.   Night sweats.   Decrease in sex drive.   Vaginal dryness and thinning of the vagina causing painful intercourse.   Dryness of the skin and developing wrinkles.   Headaches.   Tiredness.   Irritability.   Memory problems.   Weight gain.   Bladder infections.   Hair growth of the face and chest.   Infertility.  More serious symptoms include:  Loss of bone (osteoporosis) causing breaks (fractures).   Depression.   Hardening and narrowing of the arteries (atherosclerosis) causing heart attacks and strokes.  DIAGNOSIS   When the menstrual periods have stopped for 12 straight months.   Physical exam.   Hormone studies of the blood.  TREATMENT  There are many treatment choices and nearly  as many questions about them. The decisions to treat or not to treat menopausal changes is an individual choice made with your caregiver. Your caregiver can discuss the treatments with you. Together, you can decide which treatment will work best for you. Your treatment choices may include:   Hormone therapy (estorgen and progesterone).   Non-hormonal medications.   Treating the individual symptoms with medication (for example antidepressants for depression).   Herbal medications that may help specific symptoms.   Counseling by a psychiatrist or psychologist.   Group therapy.   Lifestyle changes including:   Eating healthy.   Regular exercise.   Limiting caffeine and alcohol.   Stress management and meditation.   No treatment.  HOME CARE INSTRUCTIONS   Take the medication your caregiver gives you as directed.   Get plenty of sleep and rest.   Exercise regularly.   Eat a diet that contains calcium (good for the bones) and soy products (acts like estrogen hormone).   Avoid alcoholic beverages.   Do not smoke.   If you have hot flashes, dress in layers.   Take supplements, calcium and vitamin D to strengthen bones.   You can use over-the-counter lubricants or moisturizers for vaginal dryness.   Group therapy is sometimes very helpful.   Acupuncture may be helpful in some cases.  SEEK MEDICAL CARE IF:   You are not sure you are in menopause.   You are having menopausal symptoms and need advice and treatment.   You are still having menstrual periods after age 55.     You have pain with intercourse.   Menopause is complete (no menstrual period for 12 months) and you develop vaginal bleeding.   You need a referral to a specialist (gynecologist, psychiatrist or psychologist) for treatment.  SEEK IMMEDIATE MEDICAL CARE IF:   You have severe depression.   You have excessive vaginal bleeding.   You fell and think you have a broken bone.   You have pain when you  urinate.   You develop leg or chest pain.   You have a fast pounding heart beat (palpitations).   You have severe headaches.   You develop vision problems.   You feel a lump in your breast.   You have abdominal pain or severe indigestion.  Document Released: 03/14/2003 Document Revised: 12/11/2010 Document Reviewed: 10/20/2007 [facility] Patient Information 2012 [place], [place].  Diabetes Meal Planning Guide The diabetes meal planning guide is a tool to help you plan your meals and snacks. It is important for people with diabetes to manage their blood glucose (sugar) levels. Choosing the right foods and the right amounts throughout your day will help control your blood glucose. Eating right can even help you improve your blood pressure and reach or maintain a healthy weight. CARBOHYDRATE COUNTING MADE EASY When you eat carbohydrates, they turn to sugar. This raises your blood glucose level. Counting carbohydrates can help you control this level so you feel better. When you plan your meals by counting carbohydrates, you can have more flexibility in what you eat and balance your medicine with your food intake. Carbohydrate counting simply means adding up the total amount of carbohydrate grams in your meals and snacks. Try to eat about the same amount at each meal. Foods with carbohydrates are listed below. Each portion below is 1 carbohydrate serving or 15 grams of carbohydrates. Ask your dietician how many grams of carbohydrates you should eat at each meal or snack. Grains and Starches  1 slice bread.    English muffin or hotdog/hamburger bun.    cup cold cereal (unsweetened).   ? cup cooked pasta or rice.    cup starchy vegetables (corn, potatoes, peas, beans, winter squash).   1 tortilla (6 inches).    bagel.   1 waffle or pancake (size of a CD).    cup cooked cereal.   4 to 6 small crackers.  *Whole grain is recommended. Fruit  1 cup fresh unsweetened berries, melon,  papaya, pineapple.   1 small fresh fruit.    banana or mango.    cup fruit juice (4 oz unsweetened).    cup canned fruit in natural juice or water.   2 tbs dried fruit.   12 to 15 grapes or cherries.  Milk and Yogurt  1 cup fat-free or 1% milk.   1 cup soy milk.   6 oz light yogurt with sugar-free sweetener.   6 oz low-fat soy yogurt.   6 oz plain yogurt.  Vegetables  1 cup raw or  cup cooked is counted as 0 carbohydrates or a "free" food.   If you eat 3 or more servings at 1 meal, count them as 1 carbohydrate serving.  Other Carbohydrates   oz chips or pretzels.    cup ice cream or frozen yogurt.    cup sherbet or sorbet.   2 inch square cake, no frosting.   1 tbs honey, sugar, jam, jelly, or syrup.   2 small cookies.   3 squares of graham crackers.   3 cups popcorn.   6  crackers.   1 cup broth-based soup.   Count 1 cup casserole or other mixed foods as 2 carbohydrate servings.   Foods with less than 20 calories in a serving may be counted as 0 carbohydrates or a "free" food.  You may want to purchase a book or computer software that lists the carbohydrate gram counts of different foods. In addition, the nutrition facts panel on the labels of the foods you eat are a good source of this information. The label will tell you how big the serving size is and the total number of carbohydrate grams you will be eating per serving. Divide this number by 15 to obtain the number of carbohydrate servings in a portion. Remember, 1 carbohydrate serving equals 15 grams of carbohydrate. SERVING SIZES Measuring foods and serving sizes helps you make sure you are getting the right amount of food. The list below tells how big or small some common serving sizes are.  1 oz.........4 stacked dice.   3 oz........Marland KitchenDeck of cards.   1 tsp.......Marland KitchenTip of little finger.   1 tbs......Marland KitchenMarland KitchenThumb.   2 tbs.......Marland KitchenGolf ball.    cup......Marland KitchenHalf of a fist.   1 cup.......Marland KitchenA  fist.  SAMPLE DIABETES MEAL PLAN Below is a sample meal plan that includes foods from the grain and starches, dairy, vegetable, fruit, and meat groups. A dietician can individualize a meal plan to fit your calorie needs and tell you the number of servings needed from each food group. However, controlling the total amount of carbohydrates in your meal or snack is more important than making sure you include all of the food groups at every meal. You may interchange carbohydrate containing foods (dairy, starches, and fruits). The meal plan below is an example of a 2000 calorie diet using carbohydrate counting. This meal plan has 17 carbohydrate servings. Breakfast  1 cup oatmeal (2 carb servings).    cup light yogurt (1 carb serving).   1 cup blueberries (1 carb serving).    cup almonds.  Snack  1 large apple (2 carb servings).   1 low-fat string cheese stick.  Lunch  Chicken breast salad.   1 cup spinach.    cup chopped tomatoes.   2 oz chicken breast, sliced.   2 tbs low-fat [place] dressing.   12 whole-Char crackers (2 carb servings).   12 to 15 grapes (1 carb serving).   1 cup low-fat milk (1 carb serving).  Snack  1 cup carrots.    cup hummus (1 carb serving).  Dinner  3 oz broiled salmon.   1 cup brown rice (3 carb servings).  Snack  1  cups steamed broccoli (1 carb serving) drizzled with 1 tsp olive oil and lemon juice.   1 cup light pudding (2 carb servings).  DIABETES MEAL PLANNING WORKSHEET Your dietician can use this worksheet to help you decide how many servings of foods and what types of foods are right for you.  BREAKFAST Food Group and Servings / Carb Servings Grain/Starches __________________________________ Dairy __________________________________________ Vegetable ______________________________________ Fruit ___________________________________________ Meat __________________________________________ Fat  ____________________________________________ LUNCH Food Group and Servings / Carb Servings Grain/Starches ___________________________________ Dairy ___________________________________________ Fruit ____________________________________________ Meat ___________________________________________ Fat _____________________________________________ Laural Golden Food Group and Servings / Carb Servings Grain/Starches ___________________________________ Dairy ___________________________________________ Fruit ____________________________________________ Meat ___________________________________________ Fat _____________________________________________ SNACKS Food Group and Servings / Carb Servings Grain/Starches ___________________________________ Dairy ___________________________________________ Vegetable _______________________________________ Fruit ____________________________________________ Meat ___________________________________________ Fat _____________________________________________ DAILY TOTALS Starches _________________________ Vegetable ________________________ Fruit ____________________________ Dairy ____________________________ Meat ____________________________ Fat ______________________________ Document Released: 09/18/2004 Document Revised:  12/11/2010 Document Reviewed: 07/30/2008 [facility] Patient Information 2012 [place], [place].

## 2011-10-09 ENCOUNTER — Encounter: Payer: Self-pay | Admitting: Family

## 2011-10-09 ENCOUNTER — Ambulatory Visit (INDEPENDENT_AMBULATORY_CARE_PROVIDER_SITE_OTHER): Payer: BC Managed Care – PPO | Admitting: Family

## 2011-10-09 VITALS — BP 118/80 | HR 91 | Temp 98.3°F | Wt 156.0 lb

## 2011-10-09 DIAGNOSIS — M25539 Pain in unspecified wrist: Secondary | ICD-10-CM

## 2011-10-09 DIAGNOSIS — M779 Enthesopathy, unspecified: Secondary | ICD-10-CM

## 2011-10-09 MED ORDER — MELOXICAM 15 MG PO TABS: 15.0000 mg | ORAL_TABLET | Freq: Every day | ORAL | Status: DC

## 2011-10-09 NOTE — Patient Instructions (Signed)
Tendinitis Tendinitis is swelling and inflammation of the tendons. Tendons are band-like tissues that connect muscle to bone. Tendinitis commonly occurs in the:   Shoulders (rotator cuff).  Heels (Achilles tendon).  Elbows (triceps tendon). CAUSES Tendinitis is usually caused by overusing the tendon, muscles, and joints involved. When the tissue surrounding a tendon (synovium) becomes inflamed, it is called tenosynovitis. Tendinitis commonly develops in people whose jobs require repetitive motions. SYMPTOMS  Pain.  Tenderness.  Mild swelling. DIAGNOSIS Tendinitis is usually diagnosed by physical exam. Your caregiver may also order X-rays or other imaging tests. TREATMENT Your caregiver may recommend certain medicines or exercises for your treatment. HOME CARE INSTRUCTIONS   Use a sling or splint for as long as directed by your caregiver until the pain decreases.  Put ice on the injured area.  Put ice in a plastic bag.  Place a towel between your skin and the bag.  Leave the ice on for 15 to 20 minutes, 3 to 4 times a day.  Avoid using the limb while the tendon is painful. Perform gentle range of motion exercises only as directed by your caregiver. Stop exercises if pain or discomfort increase, unless directed otherwise by your caregiver.  Only take over-the-counter or prescription medicines for pain, discomfort, or fever as directed by your caregiver. SEEK MEDICAL CARE IF:   Your pain and swelling increase.  You develop new, unexplained symptoms, especially increased numbness in the hands. MAKE SURE YOU:   Understand these instructions.  Will watch your condition.  Will get help right away if you are not doing well or get worse. Document Released: 12/20/1999 Document Revised: 03/16/2011 Document Reviewed: 03/10/2010 [employer] Patient Information 2013 [employer].  

## 2011-10-12 ENCOUNTER — Encounter: Payer: Self-pay | Admitting: Family

## 2011-10-12 ENCOUNTER — Ambulatory Visit (INDEPENDENT_AMBULATORY_CARE_PROVIDER_SITE_OTHER)
Admission: RE | Admit: 2011-10-12 | Discharge: 2011-10-12 | Disposition: A | Discharge status: Home / Self Care | Payer: BC Managed Care – PPO | Source: Ambulatory Visit | Attending: Family | Admitting: Family

## 2011-10-12 DIAGNOSIS — M25539 Pain in unspecified wrist: Secondary | ICD-10-CM

## 2011-10-12 NOTE — Progress Notes (Signed)
Subjective:    Patient ID: Donna Blankenship, female    DOB: Mar 14, 1965, 46 y.o.   MRN: [id number]  HPI 46 year old African American female, nonsmoker is in with complaints of left wrist pain off and on x6 months. She has been using a wrist splint and exercises chart helped reduce the pain in her wrist. Reports seeing the cyst in the area of discomfort. Denies any injury past or present. Rates her pain a 4/10, that's worse with movement. Denies any numbness or tingling. Denies any redness, rash or want to touch. She is not interested in any pharmacological methods of treating this issue. She has a history of tendinitis.   Review of Systems  Constitutional: Negative.   Respiratory: Negative.   Cardiovascular: Negative.   Gastrointestinal: Negative.   Musculoskeletal: Positive for arthralgias.       Pain to the left wrist.  Skin: Negative.   Neurological: Negative.   Hematological: Negative.   Psychiatric/Behavioral: Negative.    No past medical history on file.  History   Social History  . Marital Status: Married    Spouse Name: N/A    Number of Children: N/A  . Years of Education: N/A   Occupational History  . Not on file.   Social History Main Topics  . Smoking status: Never Smoker   . Smokeless tobacco: Not on file  . Alcohol Use: Yes     rarely  . Drug Use: No  . Sexually Active: Not on file   Other Topics Concern  . Not on file   Social History Narrative  . No narrative on file    No past surgical history on file.  Family History  Problem Relation Age of Onset  . Diabetes Mother   . Diabetes Father   . Stroke Father   . Anuerysm Father   . Sudden death Brother   . Heart attack Brother   . Diabetes Paternal Grandmother     Allergies  Allergen Reactions  . Sulfamethoxazole     REACTION: unspecified    Current Outpatient Prescriptions on File Prior to Visit  Medication Sig Dispense Refill  . ESZOPICLONE 3 MG tablet Take 3 mg by mouth at bedtime.        Marland Kitchen glucose blood (ONE TOUCH ULTRA TEST) test strip CHECK BLOOD GLUCOSE ONCE A DAY AS NEEDED  50 each  6  . ONETOUCH DELICA LANCETS MISC       . terconazole (TERAZOL 3) 0.8 % vaginal cream         BP 118/80  Pulse 91  Temp 98.3 F (36.8 C) (Oral)  Wt 156 lb (70.761 kg)  SpO2 98%chart    Objective:   Physical Exam  Constitutional: She is oriented to person, place, and time. She appears well-developed and well-nourished.  Cardiovascular: Normal rate, regular rhythm and normal heart sounds.   Pulmonary/Chest: Breath sounds normal.  Musculoskeletal: Normal range of motion. She exhibits tenderness. She exhibits no edema.       Left wrist tenderness to the medial aspect. Small cyst noted.  Neurological: She is alert and oriented to person, place, and time. She displays normal reflexes. She exhibits normal muscle tone. Coordination normal.  Skin: Skin is warm and dry. No rash noted.  Psychiatric: She has a normal mood and affect.          Assessment & Plan:  Assessment: Left wrist pain, tendinitis  Plan: We'll x-ray her wrist to be sure she doesn't have any underlying etiology. I suggested  anti-inflammatory medications ice, and wrist splint. Patient declines anti-inflammatory. Printed a prescription for Mobic 15 mg once daily just if she changes her mind. We'll follow the patient of the results of her x-ray, consider physical therapy. Recheck a schedule, appearing.

## 2011-10-16 ENCOUNTER — Telehealth: Payer: Self-pay | Admitting: Family

## 2011-10-16 DIAGNOSIS — M25539 Pain in unspecified wrist: Secondary | ICD-10-CM

## 2011-10-16 NOTE — Telephone Encounter (Signed)
Left message for pt to call back on Monday

## 2011-10-16 NOTE — Telephone Encounter (Signed)
Patient called stating that she would like to speak with the nurse concerning xray results. Please assist.

## 2011-10-20 NOTE — Telephone Encounter (Signed)
Caller: [name]/Patient; Patient Name: Donna Blankenship; PCP: [facility]); Best Callback Phone Number: [phone].  Pt calling for referral for Physical Therapy for her left wrist.  No triage.  PLEASE FOLLOW UP WITH PT IN REGARD OR REFERRAL FOR PHYICAL THERAPY.  Thank you!

## 2011-10-20 NOTE — Telephone Encounter (Signed)
Left message on # provided in note for pt to call back

## 2011-10-20 NOTE — Telephone Encounter (Signed)
Pt requesting referral for physical therapy. Ok to refer??

## 2011-11-02 ENCOUNTER — Ambulatory Visit: Payer: BC Managed Care – PPO | Attending: Family

## 2011-11-02 DIAGNOSIS — IMO0001 Reserved for inherently not codable concepts without codable children: Secondary | ICD-10-CM | POA: Insufficient documentation

## 2011-11-02 DIAGNOSIS — R5381 Other malaise: Secondary | ICD-10-CM | POA: Insufficient documentation

## 2011-11-09 ENCOUNTER — Ambulatory Visit: Payer: BC Managed Care – PPO | Attending: Family

## 2011-11-09 DIAGNOSIS — M25539 Pain in unspecified wrist: Secondary | ICD-10-CM | POA: Insufficient documentation

## 2011-11-09 DIAGNOSIS — R5381 Other malaise: Secondary | ICD-10-CM | POA: Insufficient documentation

## 2011-11-09 DIAGNOSIS — IMO0001 Reserved for inherently not codable concepts without codable children: Secondary | ICD-10-CM | POA: Insufficient documentation

## 2011-11-11 ENCOUNTER — Ambulatory Visit: Payer: BC Managed Care – PPO

## 2011-11-23 ENCOUNTER — Encounter: Payer: BC Managed Care – PPO | Admitting: Physical Therapy

## 2011-11-25 ENCOUNTER — Encounter: Payer: BC Managed Care – PPO | Admitting: Physical Therapy

## 2011-12-22 ENCOUNTER — Telehealth: Payer: Self-pay | Admitting: Family

## 2011-12-22 NOTE — Telephone Encounter (Signed)
Needs office visit to discuss. She has not been seen for chronic sinusitis or nasal blockage

## 2011-12-22 NOTE — Telephone Encounter (Signed)
Patient called stating that she would like to be referred to an ENT for chronic sinuses and blocked nasal cavities. Please assist..

## 2011-12-22 NOTE — Telephone Encounter (Signed)
Left message on patient vm.

## 2012-01-06 LAB — HM PAP SMEAR: HM Pap smear: NEGATIVE

## 2012-01-11 ENCOUNTER — Encounter: Payer: Self-pay | Admitting: Family

## 2012-01-11 ENCOUNTER — Ambulatory Visit (INDEPENDENT_AMBULATORY_CARE_PROVIDER_SITE_OTHER): Payer: BC Managed Care – PPO | Admitting: Family

## 2012-01-11 VITALS — BP 122/80 | HR 94 | Wt 153.0 lb

## 2012-01-11 DIAGNOSIS — R739 Hyperglycemia, unspecified: Secondary | ICD-10-CM

## 2012-01-11 DIAGNOSIS — E039 Hypothyroidism, unspecified: Secondary | ICD-10-CM

## 2012-01-11 DIAGNOSIS — R7309 Other abnormal glucose: Secondary | ICD-10-CM

## 2012-01-11 DIAGNOSIS — J309 Allergic rhinitis, unspecified: Secondary | ICD-10-CM

## 2012-01-11 NOTE — Progress Notes (Signed)
Subjective:    Patient ID: Donna Blankenship, female    DOB: 01/07/1965, 47 y.o.   MRN: [id number]  HPI  47 year old [place] American female, nonsmoker is in for recheck of hyperglycemia, history of hypothyroidism. She's currently doing well. I currently taken any medication for her thyroid and has an about 4 years. She has not routinely been checking her blood sugars at home. Last A1c was 5.4.  Has a history of allergic rhinitis currently has congestion. Has Xyzal home that helps when she takes it. Patient tries to stay away from other medication and is more into herbal supplements.  Review of Systems  Constitutional: Negative.   HENT: Positive for congestion, rhinorrhea and sneezing.   Respiratory: Negative.   Cardiovascular: Negative.   Gastrointestinal: Negative.   Genitourinary: Negative.   Musculoskeletal: Negative.   Skin: Negative.   Neurological: Negative.   Hematological: Negative.   Psychiatric/Behavioral: Negative.    No past medical history on file.  History   Social History  . Marital Status: Married    Spouse Name: N/A    Number of Children: N/A  . Years of Education: N/A   Occupational History  . Not on file.   Social History Main Topics  . Smoking status: Never Smoker   . Smokeless tobacco: Not on file  . Alcohol Use: Yes     Comment: rarely  . Drug Use: No  . Sexually Active: Not on file   Other Topics Concern  . Not on file   Social History Narrative  . No narrative on file    No past surgical history on file.  Family History  Problem Relation Age of Onset  . Diabetes Mother   . Diabetes Father   . Stroke Father   . Anuerysm Father   . Sudden death Brother   . Heart attack Brother   . Diabetes Paternal Grandmother     Allergies  Allergen Reactions  . Sulfamethoxazole     REACTION: unspecified    Current Outpatient Prescriptions on File Prior to Visit  Medication Sig Dispense Refill  . ESZOPICLONE 3 MG tablet Take 3 mg by mouth at  bedtime.       Marland Kitchen glucose blood (ONE TOUCH ULTRA TEST) test strip CHECK BLOOD GLUCOSE ONCE A DAY AS NEEDED  50 each  6  . meloxicam (MOBIC) 15 MG tablet Take 1 tablet (15 mg total) by mouth daily.  30 tablet  0  . ONETOUCH DELICA LANCETS MISC       . terconazole (TERAZOL 3) 0.8 % vaginal cream       . levocetirizine (XYZAL) 5 MG tablet         BP 122/80  Pulse 94  Wt 153 lb (69.4 kg)  SpO2 94%chart    Objective:   Physical Exam  Constitutional: She is oriented to person, place, and time. She appears well-developed and well-nourished.  HENT:  Right Ear: External ear normal.  Left Ear: External ear normal.  Nose: Nose normal.  Mouth/Throat: Oropharynx is clear and moist.       Nasal turbinates red and swollen.  Neck: Normal range of motion. Neck supple.  Cardiovascular: Normal rate, regular rhythm and normal heart sounds.   Pulmonary/Chest: Effort normal and breath sounds normal.  Neurological: She is alert and oriented to person, place, and time.  Skin: Skin is warm.  Psychiatric: She has a normal mood and affect.          Assessment & Plan:  Assessment: Hyperglycemia,  hypothyroidism, Allergic rhinits  Plan: Continue current medications. [facility] to include BMP, TSH, A1c will notify patient pending results. Encouraged healthy diet and exercise. Call the office if symptoms worsen or persist. Recheck as scheduled and as needed. I will

## 2012-01-18 ENCOUNTER — Telehealth: Payer: Self-pay | Admitting: Family

## 2012-01-18 NOTE — Telephone Encounter (Signed)
She did not get her labs done

## 2012-02-09 ENCOUNTER — Telehealth: Payer: Self-pay | Admitting: Family

## 2012-02-09 NOTE — Telephone Encounter (Signed)
Pt would like results of last labs done

## 2012-02-09 NOTE — Telephone Encounter (Signed)
Pt would prefer you call her back w/ results also. Pt will have phone w/ her.

## 2012-02-09 NOTE — Telephone Encounter (Signed)
Labs should be scanned in her chart because I believe she had them done at an outside agency. Please locate and send her a copy. Thank you!

## 2012-02-09 NOTE — Telephone Encounter (Signed)
Called patient regarding results and no answer;left message on cell phone vm

## 2012-02-09 NOTE — Telephone Encounter (Signed)
Called patient and no answer. Left msg;will mail copy and have scanned

## 2012-02-16 ENCOUNTER — Encounter: Payer: Self-pay | Admitting: Family

## 2012-02-16 ENCOUNTER — Ambulatory Visit (INDEPENDENT_AMBULATORY_CARE_PROVIDER_SITE_OTHER): Payer: BC Managed Care – PPO | Admitting: Family

## 2012-02-16 VITALS — BP 120/70 | HR 85 | Wt 156.0 lb

## 2012-02-16 DIAGNOSIS — S93409A Sprain of unspecified ligament of unspecified ankle, initial encounter: Secondary | ICD-10-CM

## 2012-02-16 DIAGNOSIS — S96912A Strain of unspecified muscle and tendon at ankle and foot level, left foot, initial encounter: Secondary | ICD-10-CM

## 2012-02-16 DIAGNOSIS — G5600 Carpal tunnel syndrome, unspecified upper limb: Secondary | ICD-10-CM

## 2012-02-16 MED ORDER — MELOXICAM 15 MG PO TABS: 15.0000 mg | ORAL_TABLET | Freq: Every day | ORAL | Status: DC

## 2012-02-16 NOTE — Progress Notes (Signed)
Subjective:    Patient ID: Donna Blankenship, female    DOB: [birth year], 47 y.o.   MRN: [id number]  HPI 47 year old African American female, nonsmoker is in today with complaints of numbness and tingling in her fingertips is particularly worse at night. The frequency of numbness to have an about 2-3 times per week. Previously, she would only have the sensation every few months. She denies any repetitive motion. She's able to shake the numbness and tingling down and returns to normal.  Patient has complaints of left ankle stiffness. Denies any pain. Denies any injury. Has not taken anything for relief. Continues to have concerns of diabetic neuropathy despite her A1c being 5.7 and blood sugar being normal.   Review of Systems  Constitutional: Negative.   HENT: Positive for hearing loss. Negative for congestion, sore throat, rhinorrhea, sneezing, postnasal drip and ear discharge.   Cardiovascular: Negative.   Gastrointestinal: Negative.   Genitourinary: Negative.   Musculoskeletal: Negative.        Left ankle stiffness  Neurological: Positive for numbness.       Numbness and tingling to the second third and fourth finger on both hands.  Hematological: Negative.   Psychiatric/Behavioral: Negative.    History reviewed. No pertinent past medical history.  History   Social History  . Marital Status: Married    Spouse Name: N/A    Number of Children: N/A  . Years of Education: N/A   Occupational History  . Not on file.   Social History Main Topics  . Smoking status: Never Smoker   . Smokeless tobacco: Not on file  . Alcohol Use: Yes     Comment: rarely  . Drug Use: No  . Sexually Active: Not on file   Other Topics Concern  . Not on file   Social History Narrative  . No narrative on file    History reviewed. No pertinent past surgical history.  Family History  Problem Relation Age of Onset  . Diabetes Mother   . Diabetes Father   . Stroke Father   . Anuerysm Father   .  Sudden death Brother   . Heart attack Brother   . Diabetes Paternal Grandmother     Allergies  Allergen Reactions  . Sulfamethoxazole     REACTION: unspecified    Current Outpatient Prescriptions on File Prior to Visit  Medication Sig Dispense Refill  . ESZOPICLONE 3 MG tablet Take 3 mg by mouth at bedtime.       Marland Kitchen glucose blood (ONE TOUCH ULTRA TEST) test strip CHECK BLOOD GLUCOSE ONCE A DAY AS NEEDED  50 each  6  . levocetirizine (XYZAL) 5 MG tablet       . ONETOUCH DELICA LANCETS MISC       . terconazole (TERAZOL 3) 0.8 % vaginal cream        No current facility-administered medications on file prior to visit.    BP 120/70  Pulse 85  Wt 156 lb (70.761 kg)  BMI 25.19 kg/m2  SpO2 96%chart    Objective:   Physical Exam  Constitutional: She is oriented to person, place, and time. She appears well-developed and well-nourished.  HENT:  Left ear impacted with cerumen  Neck: Normal range of motion. Neck supple. No thyromegaly present.  Cardiovascular: Normal rate, regular rhythm and normal heart sounds.   Pulmonary/Chest: Effort normal and breath sounds normal.  Abdominal: Soft. Bowel sounds are normal.  Musculoskeletal: Normal range of motion.  Neurological: She is alert and oriented  to person, place, and time. She has normal reflexes.  Negative Phalen. Negative Tinel  Skin: Skin is warm.  Psychiatric: She has a normal mood and affect.      Informed consent was obtained and peroxide gel was inserted into the ears bilaterally using the lavage kit the ears were lavaged until clean.Inspection with a cerumen spoon removed residual wax. Patient tolerated the procedure well.    Assessment & Plan:  Assessment: 1. Carpal tunnel syndrome 2. Left ankle stiffness 3. Cerumen impaction  Plan: Mobic 15 mg one tablet a day with food. Patient to call the office if symptoms worsen or persist. Recheck a schedule, and as needed.

## 2012-02-16 NOTE — Patient Instructions (Signed)
Carpal Tunnel Release Carpal tunnel release is done to relieve the pressure on the nerves and tendons on the bottom side of your wrist.  LET YOUR CAREGIVER KNOW ABOUT:   Allergies to food or medicine.  Medicines taken, including vitamins, herbs, eyedrops, over-the-counter medicines, and creams.  Use of steroids (by mouth or creams).  Previous problems with anesthetics or numbing medicines.  History of bleeding problems or blood clots.  Previous surgery.  Other health problems, including diabetes and kidney problems.  Possibility of pregnancy, if this applies. RISKS AND COMPLICATIONS  Some problems that may happen after this procedure include:  Infection.  Damage to the nerves, arteries or tendons could occur. This would be very uncommon.  Bleeding. BEFORE THE PROCEDURE   This surgery may be done while you are asleep (general anesthetic) or may be done under a block where only your forearm and the surgical area is numb.  If the surgery is done under a block, the numbness will gradually wear off within several hours after surgery. HOME CARE INSTRUCTIONS   Have a responsible person with you for 24 hours.  Do not drive a car or use public transportation for 24 hours.  Only take over-the-counter or prescription medicines for pain, discomfort, or fever as directed by your caregiver. Take them as directed.  You may put ice on the palm side of the affected wrist.  Put ice in a plastic bag.  Place a towel between your skin and the bag.  Leave the ice on for 20 to 30 minutes, 4 times per day.  If you were given a splint to keep your wrist from bending, use it as directed. It is important to wear the splint at night or as directed. Use the splint for as long as you have pain or numbness in your hand, arm, or wrist. This may take 1 to 2 months.  Keep your hand raised (elevated) above the level of your heart as much as possible. This keeps swelling down and helps with  discomfort.  Change bandages (dressings) as directed.  Keep the wound clean and dry. SEEK MEDICAL CARE IF:   You develop pain not relieved with medications.  You develop numbness of your hand.  You develop bleeding from your surgical site.  You have an oral temperature above 102 F (38.9 C).  You develop redness or swelling of the surgical site.  You develop new, unexplained problems. SEEK IMMEDIATE MEDICAL CARE IF:   You develop a rash.  You have difficulty breathing.  You develop any reaction or side effects to medications given. Document Released: 03/14/2003 Document Revised: 03/16/2011 Document Reviewed: 10/28/2006 [employer] Patient Information 2013 [employer].  

## 2012-05-05 ENCOUNTER — Encounter: Payer: Self-pay | Admitting: Gynecology

## 2012-05-09 ENCOUNTER — Encounter: Payer: Self-pay | Admitting: Obstetrics and Gynecology

## 2012-05-09 ENCOUNTER — Ambulatory Visit (INDEPENDENT_AMBULATORY_CARE_PROVIDER_SITE_OTHER): Payer: BC Managed Care – PPO | Admitting: Obstetrics and Gynecology

## 2012-05-09 VITALS — BP 102/60 | Ht 66.0 in | Wt 159.0 lb

## 2012-05-09 DIAGNOSIS — N9089 Other specified noninflammatory disorders of vulva and perineum: Secondary | ICD-10-CM

## 2012-05-09 DIAGNOSIS — Z01419 Encounter for gynecological examination (general) (routine) without abnormal findings: Secondary | ICD-10-CM

## 2012-05-09 DIAGNOSIS — Z0000 Encounter for general adult medical examination without abnormal findings: Secondary | ICD-10-CM

## 2012-05-09 LAB — POCT URINALYSIS DIPSTICK
Glucose, UA: NEGATIVE
Nitrite, UA: NEGATIVE
Urobilinogen, UA: NEGATIVE

## 2012-05-09 NOTE — Progress Notes (Signed)
47 y.o.  Married  [place] female   G0P0 here for annual exam.  Made appt when she was having urinary frequency and burning, but those sx/s have resolved and she just wants an AnEx.  She does have a h/o recurrent UTI's, but she's not sure if what she had recently was one or not.    Patient's last menstrual period was 01/05/2002.          Sexually active: yes  The current method of family planning is status post hysterectomy and post menopausal status.    Exercising: fitness center 5 days a week, cardio, weight training, zumba, kick boxing Last mammogram 03/06/2010 neg  Encourage to go annually Smoking:never Alcohol: occ glass of red wine Last colonoscopy:04/2011 normal repeat in 5 years Last Bone Density:  never Last tetanus shot: declined "I don't believe in that" Last cholesterol check: 12/2011 slightly elevated total 214 Last Pap 2013 neg, has never had a abnormal pap Hgb:  pcp            Urine: neg    Health Maintenance  Topic Date Due  . Pap Smear  06/04/1985  . Tetanus/tdap  06/05/1986  . Influenza Vaccine  09/05/2012    Family History  Problem Relation Age of Onset  . Diabetes Mother   . Diabetes Father   . Stroke Father   . Anuerysm Father   . Sudden death Brother   . Heart attack Brother   . Diabetes Paternal Grandmother   . Breast cancer Maternal Grandmother     Patient Active Problem List   Diagnosis Date Noted  . ACUTE LYMPHADENITIS 04/24/2008  . HYPERCALCEMIA 01/24/2008  . UNSPECIFIED ARTHROPATHY MULTIPLE SITES 01/18/2008  . FIBROMYALGIA 01/18/2008  . GERD 01/11/2008  . ACUTE CYSTITIS 11/25/2007  . UNSPECIFIED VAGINITIS AND VULVOVAGINITIS 11/09/2007  . Other Malaise and Fatigue 10/18/2007  . UNSPECIFIED VITAMIN D DEFICIENCY 09/28/2007  . HOT FLASHES 09/28/2007  . WEIGHT GAIN 09/28/2007  . PULMONARY FIBROSIS, POSTINFLAMMATORY 09/20/2007  . UTI 11/20/2006  . ALLERGIC RHINITIS 10/21/2006  . RECTAL BLEEDING 10/21/2006  . DYSPNEA/SHORTNESS OF BREATH  10/21/2006  . COLONIC POLYPS, HX OF 10/21/2006  . HYPOTHYROIDISM 07/29/2006  . DEPRESSION 07/29/2006    Past Medical History  Diagnosis Date  . Anemia     past H/o anemia  . Anxiety   . Diabetes mellitus without complication     borderline  . Fibroid 2004    abd hyst  . Thyroid disease     pasdt h/o hypothyroidism  . Urinary incontinence   . Chronic UTI (urinary tract infection)   . Infertility, female     Past Surgical History  Procedure Laterality Date  . Abdominal hysterectomy  2004    retained ovaries  . Pelvic laparoscopy  1999    Allergies: Latex and Sulfamethoxazole  Current Outpatient Prescriptions  Medication Sig Dispense Refill  . glucose blood (ONE TOUCH ULTRA TEST) test strip CHECK BLOOD GLUCOSE ONCE A DAY AS NEEDED  50 each  6  . ONETOUCH DELICA LANCETS MISC       . Pseudoephedrine HCl (SUDAFED PO) Take by mouth daily.       No current facility-administered medications for this visit.    ROS: Pertinent items are noted in HPI.  Social Hx:  Married, no children. She and husband had infertility issues;  His sperm count was low, and she had fibroids.  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  She recently moved her  36 yo mother and brother (who has mental illness) here to [facility] and is caring for them.    Exam:    BP 102/60  Ht 5\' 6"  (1.676 m)  Wt 159 lb (72.122 kg)  BMI 25.68 kg/m2  LMP 01/05/2002   Wt Readings from Last 3 Encounters:  05/09/12 159 lb (72.122 kg)  02/16/12 156 lb (70.761 kg)  01/11/12 153 lb (69.4 kg)     Ht Readings from Last 3 Encounters:  05/09/12 5\' 6"  (1.676 m)  07/07/11 5\' 6"  (1.676 m)  04/24/08 5\' 6"  (1.676 m)    General appearance: alert, cooperative and appears stated age Head: Normocephalic, without obvious abnormality, atraumatic Neck: no adenopathy, supple, symmetrical, trachea midline and thyroid not enlarged, symmetric, no tenderness/mass/nodules Lungs: clear to auscultation  bilaterally Breasts: Inspection negative, No nipple retraction or dimpling, No nipple discharge or bleeding, No axillary or supraclavicular adenopathy, Normal to palpation without dominant masses Heart: regular rate and rhythm Abdomen: soft, non-tender; bowel sounds normal; no masses,  no organomegaly Extremities: extremities normal, atraumatic, no cyanosis or edema Skin: Skin color, texture, turgor normal. No rashes or lesions Lymph nodes: Cervical, supraclavicular, and axillary nodes normal. No abnormal inguinal nodes palpated Neurologic: Grossly normal   Pelvic: External genitalia:  3 mm skin tag on left lab minus at mid section of labium, right on the fold, bothersome to patient and she requests removal                Urethra:  normal appearing urethra with no masses, tenderness or lesions              Bartholins and Skenes: normal                 Vagina: normal appearing vagina with normal color and discharge, no lesions              Cervix: normal appearance              Pap taken: no        Bimanual Exam:   No pelvic nodularity or masses                                      Adnexa: normal adnexa in size, nontender and no masses                                      Rectovaginal: Confirms                                      Anus:  normal sphincter tone, no lesions; hemorrhoidal skin tag at rectum  A: normal gyn exam     S/p supracervical hysterectomy for fibroids, ovaries retained     H/o "borderline diabetes" - A1C nl 4 mos ago, tx'd with diet and exercise     H/o hypothyroidism, off meds now for several years.  PCP checks thyroid q yr.       H/o recurrent UTI     P: mammogram counseled on breast self exam, mammography screening, adequate intake of calcium and vitamin D, diet and exercise return annually or prn  Procedure note:  Betadine to labium on left.  Local with 1% xylocaine.  Skin tag excised and discarded.  Bleeding controlled with silver nitrate.  Pt tolerated well.   Post procedure instructions given.     An After Visit Summary was printed and given to the patient.

## 2012-05-09 NOTE — Patient Instructions (Addendum)

## 2012-05-14 ENCOUNTER — Ambulatory Visit (INDEPENDENT_AMBULATORY_CARE_PROVIDER_SITE_OTHER): Payer: BC Managed Care – PPO | Admitting: Family Medicine

## 2012-05-14 VITALS — BP 116/70 | HR 62 | Temp 97.9°F | Ht 66.0 in | Wt 155.0 lb

## 2012-05-14 DIAGNOSIS — R300 Dysuria: Secondary | ICD-10-CM

## 2012-05-14 DIAGNOSIS — R109 Unspecified abdominal pain: Secondary | ICD-10-CM

## 2012-05-14 LAB — POCT URINALYSIS DIPSTICK
Glucose, UA: NEGATIVE
Spec Grav, UA: 1.01
Urobilinogen, UA: 0.2

## 2012-05-14 MED ORDER — NITROFURANTOIN MONOHYD MACRO 100 MG PO CAPS: 100.0000 mg | ORAL_CAPSULE | Freq: Two times a day (BID) | ORAL | Status: DC

## 2012-05-14 NOTE — Patient Instructions (Addendum)
Urinary Tract Infection Urinary tract infections (UTIs) can develop anywhere along your urinary tract. Your urinary tract is your body's drainage system for removing wastes and extra water. Your urinary tract includes two kidneys, two ureters, a bladder, and a urethra. Your kidneys are a pair of bean-shaped organs. Each kidney is about the size of your fist. They are located below your ribs, one on each side of your spine. CAUSES Infections are caused by microbes, which are microscopic organisms, including fungi, viruses, and bacteria. These organisms are so small that they can only be seen through a microscope. Bacteria are the microbes that most commonly cause UTIs. SYMPTOMS  Symptoms of UTIs may vary by age and gender of the patient and by the location of the infection. Symptoms in young women typically include a frequent and intense urge to urinate and a painful, burning feeling in the bladder or urethra during urination. Older women and men are more likely to be tired, shaky, and weak and have muscle aches and abdominal pain. A fever may mean the infection is in your kidneys. Other symptoms of a kidney infection include pain in your back or sides below the ribs, nausea, and vomiting. DIAGNOSIS To diagnose a UTI, your caregiver will ask you about your symptoms. Your caregiver also will ask to provide a urine sample. The urine sample will be tested for bacteria and white blood cells. White blood cells are made by your body to help fight infection. TREATMENT  Typically, UTIs can be treated with medication. Because most UTIs are caused by a bacterial infection, they usually can be treated with the use of antibiotics. The choice of antibiotic and length of treatment depend on your symptoms and the type of bacteria causing your infection. HOME CARE INSTRUCTIONS  If you were prescribed antibiotics, take them exactly as your caregiver instructs you. Finish the medication even if you feel better after you  have only taken some of the medication.  Drink enough water and fluids to keep your urine clear or pale yellow.  Avoid caffeine, tea, and carbonated beverages. They tend to irritate your bladder.  Empty your bladder often. Avoid holding urine for long periods of time.  Empty your bladder before and after sexual intercourse.  After a bowel movement, women should cleanse from front to back. Use each tissue only once. SEEK MEDICAL CARE IF:   You have back pain.  You develop a fever.  Your symptoms do not begin to resolve within 3 days. SEEK IMMEDIATE MEDICAL CARE IF:   You have severe back pain or lower abdominal pain.  You develop chills.  You have nausea or vomiting.  You have continued burning or discomfort with urination. MAKE SURE YOU:   Understand these instructions.  Will watch your condition.  Will get help right away if you are not doing well or get worse. Document Released: 10/01/2004 Document Revised: 06/23/2011 Document Reviewed: 01/30/2011 [facility] Patient Information 2013 [place], [place].  Schedule followup appointment in one to 2 weeks to repeat urinalysis

## 2012-05-14 NOTE — Progress Notes (Signed)
  Subjective:    Patient ID: Donna Blankenship, female    DOB: 01/16/1965, 47 y.o.   MRN: [id number]  HPI Patient seen with one-day history of frequent urination burning with urination Moderate suprapubic discomfort. Denies any fever or chills. No gross hematuria. History of previous hysterectomy. No vaginal spotting. Denies any back pain. No abdominal pain other than suprapubic pain as above. Has previously tolerated Macrobid without difficulty. Allergy to sulfa  Past Medical History  Diagnosis Date  . Anemia     past H/o anemia  . Anxiety   . Diabetes mellitus without complication     borderline  . Fibroid 2004    abd hyst  . Thyroid disease     pasdt h/o hypothyroidism  . Urinary incontinence   . Chronic UTI (urinary tract infection)   . Infertility, female    Past Surgical History  Procedure Laterality Date  . Abdominal hysterectomy  2004    retained ovaries  . Pelvic laparoscopy  1999    reports that she has never smoked. She does not have any smokeless tobacco history on file. She reports that she drinks about 1.0 ounces of alcohol per week. She reports that she does not use illicit drugs. family history includes Anuerysm in her father; Breast cancer in her maternal grandmother; Diabetes in her father, mother, and paternal grandmother; Heart attack in her brother; Stroke in her father; and Sudden death in her brother. Allergies  Allergen Reactions  . Latex     Band aids and gloves  . Sulfamethoxazole     REACTION: unspecified      Review of Systems  Constitutional: Negative for fever, chills and appetite change.  Gastrointestinal: Negative for nausea, vomiting, abdominal pain, diarrhea and constipation.  Genitourinary: Positive for dysuria and frequency.  Musculoskeletal: Negative for back pain.  Neurological: Negative for dizziness.       Objective:   Physical Exam  Constitutional: She appears well-developed and well-nourished.  HENT:  Head: Normocephalic  and atraumatic.  Neck: Neck supple. No thyromegaly present.  Cardiovascular: Normal rate, regular rhythm and normal heart sounds.   Pulmonary/Chest: Breath sounds normal.  Abdominal: Soft. Bowel sounds are normal. There is no tenderness.          Assessment & Plan:  Dysuria. She presents with classic symptoms of UTI. Urine however shows moderate blood but no leukocytes. Macrobid one twice a day for 3 days. I will have her followup with primary provider in one to 2 weeks to repeat urinalysis after treatment to make sure hematuria cleared

## 2012-05-16 ENCOUNTER — Telehealth: Payer: Self-pay | Admitting: Family

## 2012-05-16 MED ORDER — NITROFURANTOIN MONOHYD MACRO 100 MG PO CAPS: 100.0000 mg | ORAL_CAPSULE | Freq: Two times a day (BID) | ORAL | Status: DC

## 2012-05-16 NOTE — Telephone Encounter (Signed)
Caller: Falecia/Patient; Phone: [phone]; Reason for Call: Seen in office 05/14/12 and started on macrobid BID x 3 days.  States she is improving, but is skeptical that 3 days' of antibiotic is enough to treat her symptoms, and wonders if her course should be extended.  States Dr.  Caryl Never told her that they could not send urine for culture because it was Saturday.  States was told to follow up with Ms.  Campbell in 1-2 weeks for urinalysis.  States she is improving slightly; declines new triage.  Info to office for provider review/callback.  May reach patient at [phone].  Krs/can

## 2012-05-16 NOTE — Telephone Encounter (Signed)
Take an additional 4 more days.

## 2012-05-16 NOTE — Telephone Encounter (Signed)
Pt aware.

## 2012-05-18 ENCOUNTER — Other Ambulatory Visit: Payer: Self-pay

## 2012-05-18 ENCOUNTER — Other Ambulatory Visit: Payer: Self-pay | Admitting: Family

## 2012-05-18 NOTE — Progress Notes (Signed)
Error

## 2012-05-31 ENCOUNTER — Telehealth: Payer: Self-pay | Admitting: Family

## 2012-05-31 ENCOUNTER — Encounter: Payer: Self-pay | Admitting: Family

## 2012-05-31 NOTE — Telephone Encounter (Signed)
Pt will give urine when she comes in with her mom this afternoon

## 2012-05-31 NOTE — Telephone Encounter (Signed)
Ok to bring urine earlier if she desires.

## 2012-05-31 NOTE — Telephone Encounter (Signed)
Patient Information:  Caller Name: Silvio Pate  Phone: [phone]  Patient: Donna Blankenship  Gender: Female  DOB: Jan 14, 1965  Age: 47 Years  PCP: Adline Mango [facility])  Pregnant: No  Office Follow Up:  Does the office need to follow up with this patient?: Yes  Instructions For The Office: Caller requesting a lab appt to f/u on UTI.  Advised of an appt.  Caller wanted to ask about lab and will make an appt per Providers preference. Thank you.  RN Note:  Appt declined. Caller wanting to know if she can do a lab visit first before scheduling an appt.   Pt will be in office today approx 1500 to bring her mother.  Symptoms  Reason For Call & Symptoms: Pt recently treated for an UTI.  Pt finished tx 5 days ago. Dysuria and bladder fullness.  started 05/30/12. Caller requesting an lab vist to confirm UTI before scheduling an office visit.  Reviewed Health History In EMR: Yes  Reviewed Medications In EMR: Yes  Reviewed Allergies In EMR: Yes  Reviewed Surgeries / Procedures: Yes  Date of Onset of Symptoms: 05/30/2012  Treatments Tried: Drinking fluids.  Treatments Tried Worked: No OB / GYN:  LMP: Unknown  Guideline(s) Used:  Urination Pain - Female  Disposition Per Guideline:   See Today in Office  Reason For Disposition Reached:   Painful urination AND EITHER frequency or urgency  Advice Given:  Fluids:   Drink extra fluids. Drink 8-10 glasses of liquids a day (Reason: to produce a dilute, non-irritating urine).  Call Back If:  You become worse.  Patient Refused Recommendation:  Patient Refused Appt, Patient Requests Appt At Later Date  Caller requesting a lab appt to confirm an UTI.

## 2012-06-09 ENCOUNTER — Ambulatory Visit: Payer: BC Managed Care – PPO | Admitting: Family

## 2012-06-17 ENCOUNTER — Encounter: Payer: Self-pay | Admitting: Family

## 2012-06-17 ENCOUNTER — Ambulatory Visit (INDEPENDENT_AMBULATORY_CARE_PROVIDER_SITE_OTHER): Payer: BC Managed Care – PPO | Admitting: Family

## 2012-06-17 VITALS — BP 132/80 | HR 78 | Wt 159.5 lb

## 2012-06-17 DIAGNOSIS — L658 Other specified nonscarring hair loss: Secondary | ICD-10-CM

## 2012-06-17 DIAGNOSIS — M25551 Pain in right hip: Secondary | ICD-10-CM

## 2012-06-17 DIAGNOSIS — M25552 Pain in left hip: Secondary | ICD-10-CM

## 2012-06-17 DIAGNOSIS — L659 Nonscarring hair loss, unspecified: Secondary | ICD-10-CM

## 2012-06-17 DIAGNOSIS — R5381 Other malaise: Secondary | ICD-10-CM

## 2012-06-17 DIAGNOSIS — M25559 Pain in unspecified hip: Secondary | ICD-10-CM

## 2012-06-17 DIAGNOSIS — R5383 Other fatigue: Secondary | ICD-10-CM

## 2012-06-17 LAB — TSH: TSH: 1.3 u[IU]/mL (ref 0.35–5.50)

## 2012-06-17 MED ORDER — ZOLPIDEM TARTRATE 10 MG PO TABS: 10.0000 mg | ORAL_TABLET | Freq: Every evening | ORAL | Status: DC | PRN

## 2012-06-17 MED ORDER — VENLAFAXINE HCL ER 37.5 MG PO CP24: 37.5000 mg | ORAL_CAPSULE | Freq: Every day | ORAL | Status: DC

## 2012-06-17 NOTE — Progress Notes (Signed)
Subjective:    Patient ID: Donna Blankenship, female    DOB: Sep 12, 1965, 47 y.o.   MRN: [id number]  HPI 47 year old [place] American female, nonsmoker is in today with complaints of feeling anxious and stressed. She has a history of anxiety but has never taken any medication for relief. Also has concerns of menopausal symptoms that appear to be worsening. More frequent hot flashes. Also reports an increase in hair loss particularly around the edges of her hairline. She typically wears her hair up in a bun daily.   Review of Systems  Constitutional: Positive for fatigue. Negative for diaphoresis and appetite change.  HENT: Negative.   Respiratory: Negative.   Cardiovascular: Negative.   Gastrointestinal: Negative.   Endocrine: Positive for heat intolerance. Negative for polydipsia, polyphagia and polyuria.       Hotflashes  Genitourinary: Negative.   Musculoskeletal: Positive for arthralgias. Negative for back pain and joint swelling.       Pressure in her hips bilaterally  Skin: Negative.  Negative for pallor, rash and wound.       Hair loss  Allergic/Immunologic: Negative.   Neurological: Negative.   Hematological: Negative.   Psychiatric/Behavioral: Positive for sleep disturbance. The patient is nervous/anxious.    Past Medical History  Diagnosis Date  . Anemia     past H/o anemia  . Anxiety   . Diabetes mellitus without complication     borderline  . Fibroid 2004    abd hyst  . Thyroid disease     pasdt h/o hypothyroidism  . Urinary incontinence   . Chronic UTI (urinary tract infection)   . Infertility, female     History   Social History  . Marital Status: Married    Spouse Name: N/A    Number of Children: N/A  . Years of Education: N/A   Occupational History  . Not on file.   Social History Main Topics  . Smoking status: Never Smoker   . Smokeless tobacco: Not on file  . Alcohol Use: 1.0 oz/week    2 drink(s) per week     Comment: occ glass of wine  . Drug  Use: No  . Sexually Active: Yes -- Female partner(s)   Other Topics Concern  . Not on file   Social History Narrative  . No narrative on file    Past Surgical History  Procedure Laterality Date  . Abdominal hysterectomy  2004    retained ovaries  . Pelvic laparoscopy  1999    Family History  Problem Relation Age of Onset  . Diabetes Mother   . Diabetes Father   . Stroke Father   . Anuerysm Father   . Sudden death Brother   . Heart attack Brother   . Diabetes Paternal Grandmother   . Breast cancer Maternal Grandmother     Allergies  Allergen Reactions  . Latex     Band aids and gloves  . Sulfamethoxazole     REACTION: unspecified    Current Outpatient Prescriptions on File Prior to Visit  Medication Sig Dispense Refill  . glucose blood (ONE TOUCH ULTRA TEST) test strip CHECK BLOOD GLUCOSE ONCE A DAY AS NEEDED  50 each  6  . nitrofurantoin, macrocrystal-monohydrate, (MACROBID) 100 MG capsule Take 1 capsule (100 mg total) by mouth 2 (two) times daily.  14 capsule  0  . ONETOUCH DELICA LANCETS MISC       . Pseudoephedrine HCl (SUDAFED PO) Take by mouth daily.      Marland Kitchen  ESZOPICLONE 3 MG tablet TAKE 1 TABLET BY MOUTH AT BEDTIME  30 tablet  0   No current facility-administered medications on file prior to visit.    BP 132/80  Pulse 78  Wt 159 lb 8 oz (72.349 kg)  BMI 25.76 kg/m2  SpO2 98%  LMP 01/01/2004chart    Objective:   Physical Exam  Constitutional: She is oriented to person, place, and time. She appears well-developed and well-nourished.  HENT:  Right Ear: External ear normal.  Left Ear: External ear normal.  Nose: Nose normal.  Mouth/Throat: Oropharynx is clear and moist.  Neck: Normal range of motion. Neck supple. No thyromegaly present.  Cardiovascular: Normal rate, regular rhythm and normal heart sounds.   Pulmonary/Chest: Effort normal and breath sounds normal.  Abdominal: Bowel sounds are normal.  Musculoskeletal: Normal range of motion. She  exhibits no edema and no tenderness.  Neurological: She is alert and oriented to person, place, and time. She has normal reflexes.  Skin: Skin is warm and dry.          Assessment & Plan:  Assessment: 1. Bilateral hip pain 2. Traction alopecia/hair loss 3. Fatigue 4. Acute stress reaction  Plan: X-ray of the hips bilaterally. Encourage patient not to pull her hair back in a pony tail. Use Kera Care and conditioning products. Labs sent. Will notify patient pending results. See Dr. Isaac Laud for hair loss. Start Effexor for mood and hot flashes.

## 2012-06-17 NOTE — Patient Instructions (Addendum)
1. Kera Care Shampoo and conditioner 2. Consider Effexor for menopausal symptoms 3. Dr. Isaac Laud

## 2012-06-20 ENCOUNTER — Telehealth: Payer: Self-pay | Admitting: Obstetrics and Gynecology

## 2012-06-20 NOTE — Telephone Encounter (Signed)
Appt. Scheduled for 06/23/12 w/ Dr. Edward Jolly

## 2012-06-20 NOTE — Telephone Encounter (Signed)
Pt is having menopausal problems.please call to schedule an appt.

## 2012-06-20 NOTE — Telephone Encounter (Signed)
Appt. Scheduled for 06/20/12 w/ Dr. Edward Jolly

## 2012-06-21 ENCOUNTER — Ambulatory Visit (INDEPENDENT_AMBULATORY_CARE_PROVIDER_SITE_OTHER)
Admission: RE | Admit: 2012-06-21 | Discharge: 2012-06-21 | Disposition: A | Discharge status: Home / Self Care | Payer: BC Managed Care – PPO | Source: Ambulatory Visit | Attending: Family | Admitting: Family

## 2012-06-21 DIAGNOSIS — M25552 Pain in left hip: Secondary | ICD-10-CM

## 2012-06-21 DIAGNOSIS — M25551 Pain in right hip: Secondary | ICD-10-CM

## 2012-06-21 DIAGNOSIS — M25559 Pain in unspecified hip: Secondary | ICD-10-CM

## 2012-06-23 ENCOUNTER — Other Ambulatory Visit: Payer: Self-pay | Admitting: Obstetrics and Gynecology

## 2012-06-23 ENCOUNTER — Encounter: Payer: Self-pay | Admitting: Obstetrics and Gynecology

## 2012-06-23 ENCOUNTER — Ambulatory Visit (INDEPENDENT_AMBULATORY_CARE_PROVIDER_SITE_OTHER): Payer: BC Managed Care – PPO | Admitting: Obstetrics and Gynecology

## 2012-06-23 VITALS — BP 108/72 | HR 68 | Resp 16 | Ht 66.0 in | Wt 158.0 lb

## 2012-06-23 DIAGNOSIS — Z1231 Encounter for screening mammogram for malignant neoplasm of breast: Secondary | ICD-10-CM

## 2012-06-23 DIAGNOSIS — Z1239 Encounter for other screening for malignant neoplasm of breast: Secondary | ICD-10-CM

## 2012-06-23 DIAGNOSIS — N952 Postmenopausal atrophic vaginitis: Secondary | ICD-10-CM

## 2012-06-23 DIAGNOSIS — Z8249 Family history of ischemic heart disease and other diseases of the circulatory system: Secondary | ICD-10-CM

## 2012-06-23 DIAGNOSIS — N951 Menopausal and female climacteric states: Secondary | ICD-10-CM

## 2012-06-23 MED ORDER — ESTRADIOL 0.1 MG/GM VA CREA: TOPICAL_CREAM | VAGINAL | Status: DC

## 2012-06-23 NOTE — Patient Instructions (Addendum)
Menopause Menopause is the normal time of life when menstrual periods stop completely. Menopause is complete when you have missed 12 consecutive menstrual periods. It usually occurs between the ages of 48 to 55, with an average age of 51. Very rarely does a woman develop menopause before 45 years old. At menopause, your ovaries stop producing the female hormones, estrogen and progesterone. This can cause undesirable symptoms and also affect your health. Sometimes the symptoms may occur 4 to 5 years before the menopause begins. There is no relationship between menopause and:  Oral contraceptives.  Number of children you had.  Race.  The age your menstrual periods started (menarche). Heavy smokers and very thin women may develop menopause earlier in life. CAUSES  The ovaries stop producing the female hormones estrogen and progesterone.  Other causes include:  Surgery to remove both ovaries.  The ovaries stop functioning for no known reason.  Tumors of the pituitary gland in the brain.  Medical disease that affects the ovaries and hormone production.  Radiation treatment to the abdomen or pelvis.  Chemotherapy that affects the ovaries. SYMPTOMS   Hot flashes.  Night sweats.  Decrease in sex drive.  Vaginal dryness and thinning of the vagina causing painful intercourse.  Dryness of the skin and developing wrinkles.  Headaches.  Tiredness.  Irritability.  Memory problems.  Weight gain.  Bladder infections.  Hair growth of the face and chest.  Infertility. More serious symptoms include:  Loss of bone (osteoporosis) causing breaks (fractures).  Depression.  Hardening and narrowing of the arteries (atherosclerosis) causing heart attacks and strokes. DIAGNOSIS   When the menstrual periods have stopped for 12 straight months.  Physical exam.  Hormone studies of the blood. TREATMENT  There are many treatment choices and nearly as many questions about them.  The decisions to treat or not to treat menopausal changes is an individual choice made with your caregiver. Your caregiver can discuss the treatments with you. Together, you can decide which treatment will work best for you. Your treatment choices may include:   Hormone therapy (estorgen and progesterone).  Non-hormonal medications.  Treating the individual symptoms with medication (for example antidepressants for depression).  Herbal medications that may help specific symptoms.  Counseling by a psychiatrist or psychologist.  Group therapy.  Lifestyle changes including:  Eating healthy.  Regular exercise.  Limiting caffeine and alcohol.  Stress management and meditation.  No treatment. HOME CARE INSTRUCTIONS   Take the medication your caregiver gives you as directed.  Get plenty of sleep and rest.  Exercise regularly.  Eat a diet that contains calcium (good for the bones) and soy products (acts like estrogen hormone).  Avoid alcoholic beverages.  Do not smoke.  If you have hot flashes, dress in layers.  Take supplements, calcium and vitamin D to strengthen bones.  You can use over-the-counter lubricants or moisturizers for vaginal dryness.  Group therapy is sometimes very helpful.  Acupuncture may be helpful in some cases. SEEK MEDICAL CARE IF:   You are not sure you are in menopause.  You are having menopausal symptoms and need advice and treatment.  You are still having menstrual periods after age 55.  You have pain with intercourse.  Menopause is complete (no menstrual period for 12 months) and you develop vaginal bleeding.  You need a referral to a specialist (gynecologist, psychiatrist or psychologist) for treatment. SEEK IMMEDIATE MEDICAL CARE IF:   You have severe depression.  You have excessive vaginal bleeding.  You fell and   think you have a broken bone.  You have pain when you urinate.  You develop leg or chest pain.  You have a fast  pounding heart beat (palpitations).  You have severe headaches.  You develop vision problems.  You feel a lump in your breast.  You have abdominal pain or severe indigestion. Document Released: 03/14/2003 Document Revised: 03/16/2011 Document Reviewed: 10/20/2007 [facility] Patient Information 2014 [place], [place].  Atrophic Vaginitis Atrophic vaginitis is a problem of low levels of estrogen in women. This problem can happen at any age. It is most common in women who have gone through menopause ("the change").  HOW WILL I KNOW IF I HAVE THIS PROBLEM? You may have:  Trouble with peeing (urinating), such as:  Going to the bathroom often.  A hard time holding your pee until you reach a bathroom.  Leaking pee.  Having pain when you pee.  Itching or a burning feeling.  Vaginal bleeding and spotting.  Pain during sex.  Dryness of the vagina.  A yellow, bad-smelling fluid (discharge) coming from the vagina. HOW WILL MY DOCTOR CHECK FOR THIS PROBLEM?  During your exam, your doctor will likely find the problem.  If there is a vaginal fluid, it may be checked for infection. HOW WILL THIS PROBLEM BE TREATED? Keep the vulvar skin as clean as possible. Moisturizers and lubricants can help with some of the symptoms. Estrogen replacement can help. There are 2 ways to take estrogen:  Systemic estrogen gets estrogen to your whole body. It takes many weeks or months before the symptoms get better.  You take an estrogen pill.  You use a skin patch. This is a patch that you put on your skin.  If you still have your uterus, your doctor may ask you to take a hormone. Talk to your doctor about the right medicine for you.  Estrogen cream.  This puts estrogen only at the part of your body where you apply it. The cream is put into the vagina or put on the vulvar skin. For some women, estrogen cream works faster than pills or the patch. CAN ALL WOMEN WITH THIS PROBLEM USE ESTROGEN? No.  Women with certain types of cancer, liver problems, or problems with blood clots should not take estrogen. Your doctor can help you decide the best treatment for your symptoms. Document Released: 06/10/2007 Document Revised: 03/16/2011 Document Reviewed: 06/10/2007 [facility] Patient Information 2014 [place], [place].  Hormonal Therapy for Women, Frequently Asked Questions WHAT IS HORMONE THERAPY? Hormone therapy (HT), estrogen and progesterone, provides women with the female hormones that decrease and are lost as women get older. When the hormone estrogen is given alone, it is usually referred to as "ERT." When the hormone progesterone is combined with estrogen, it is generally called "HT." Previously this was known as hormone replacement therapy (HRT). Estrogen is a female hormone that brings about changes in various organs in the body. Progesterone is a female hormone that prepares the uterus for a pregnancy each month. During the change-over to menopause ("perimenopause") these hormone levels start to decrease. This causes many uncomfortable symptoms (see below). When the ovaries stop producing estrogen and progesterone, menstrual periods come to an end. At this point, the woman has experienced menopause. Menopause is complete when a woman misses 12 consecutive menstrual periods. WHAT ARE THE BENEFITS OF HORMONE THERAPY? Hormone therapy has been used to relieve the short-term symptoms of menopause. These include:  Hot flashes.  Depression.  Memory loss.  Correcting irregular menstrual periods.  Night sweats.  Tiredness.  Mood disturbances.  Thinning of scalp hair.  Disturbed sleep.  Vaginal dryness.  Painful intercourse.  Loss of breast tissue. Evidence shows that HT may be helpful in preventing colon cancer and bone loss (osteoporosis). WHAT ARE THE SHORT-TERM RISKS OF HORMONE THERAPY?  Some women report side effects from taking Hormone Therapy, including:  Feeling sick to  stomach (nausea).  Fluid retention.  Swollen breasts.  Acne, when taking HT with progesterone.  Unusual vaginal discharge and bleeding (if the uterus is present).  Headaches.  Some women think HT will make them gain weight. Research now shows this is not true. Some women do gain weight during menopause, but this is because their metabolism slows down as they age. They also may not be increasing their amount or level of physical activity as they get older.  Short-term benefits or side effects should become noticeable within days, weeks, or sometimes months after treatment begins. LONG-TERM RISKS These will not be easily noticeable for each individual woman. There are many factors involved that can contribute to long-term risks and side effects. CANCER There is concern that HT can increase the risk of some cancers, including endometrial cancer (lining of the uterus), breast, and certain (but not all) ovarian or cervix cancers, such as endometriod ovarian cancer.  When estrogen is taken alone, it raises the risk of endometrial cancer, if the uterus is still present. Adding progestin with estrogen (HT) can greatly reduce this risk. Progestin is added to prevent the overgrowth (hyperplasia) of cells in the uterine lining. Women who still have an intact uterus are generally given this combined therapy and should not take estrogen hormone alone without progesterone. HT with estrogen and progestin has been linked to an increased risk of invasive breast cancer. Women who use estrogen plus progestin for four years or longer are more likely to develop breast cancer than women who have not used them for as long. This indicates that the therapy may have a cumulative effect. The decision to take HT should be based on an overall look at the risk and benefits, and how they fit with your personal and genetic health profile. Conditions that increase the underlying risk of developing breast cancer  include:  Family history of breast cancer.  Early age of the first menstrual period (menarche).  Late age of child bearing.  High fat diet.  Late menopause.  Obesity.  Increased breast density on mammograms.  Certain non-cancerous (benign) breast lesions.  Excessive use of alcohol.  Extensive radiation exposure to the chest. These factors need to be considered when deciding to take HT. If you are currently taking HT and have concerns, talk with your caregiver as soon as possible.  BREAST DENSITY Taking both estrogen and progestin also can affect a woman's breast density. Increased breast density from HT makes it hard for a radiologist to read some special breast x-rays (mammograms). This leads to the need for follow-up mammograms, ultrasound or MRI (magnetic resonance imaging), or taking breast tissue samples that are surgically removed (biopsies). Increased density also is a concern because studies have shown that women age 63 and older, whose mammograms show at least 75 percent dense tissue, are at increased risk for breast cancer. However, it is not known if increased breast density due to HT carries the same risk for breast cancer as having naturally dense breasts. About 25 percent of women who use combined HT have an increase in breast density on their mammograms. This is compared to  about 8 percent of women taking estrogen alone. One study showed that stopping HT for about 2 weeks before having a mammogram improved the readability of the mammogram. But further research is needed to confirm the usefulness of this approach. HEART DISEASE In the past, taking HT (estrogen plus progestin) was thought to help protect women against heart disease. However, recent findings show that taking HT poses more risks than benefits. HT could increase a woman's risk for:  Heart disease.  Stroke.  Blood clot in the lung (pulmonary embolism).  Breast cancer.  Blood clots in the legs. Women who  have gone through menopause should not be given HT to prevent heart disease and other chronic conditions.  Women who have gone through menopause and who have heart disease, may have a greater risk of another cardiac event (like heart attack) after starting HT, at least in the short-term. For women who have had strokes, their risk for having another stroke goes up when they start taking HT. Hormones are not recommended for women with heart disease or for women who have had a stroke. If you have gone through menopause, talk with your caregiver about whether hormones are right for you. You can check the [facility] Information [facility] website (http://hoffman.com/) for updates on postmenopausal hormone therapy. OTHER RISKS INCLUDE:  Developing high blood pressure.  Developing gallbladder disease.  Women with a fibroid non-cancerous tumor on the uterus may develop pain, bleeding or increase growth of the fibroid. If you are taking HT, watch for signs of trouble. These include:  Abnormal bleeding.  Breast lumps, bloody discharge or red/painful breasts.  Shortness of breath.  Dizziness.  Abdominal pain.  Severe headaches.  Pain in your calves or chest. Report these signs to your caregiver right away. Also, talk with your caregiver about how often you should have an exam. DOES THE DURATION OF TAKING HT AFFECT BREAST CANCER RISK? The relationship between a woman's risk of developing breast cancer and the length of time that she receives HT is not clear. Some women take HT for only a few years until the worst of their menopausal symptoms have passed. Others have taken it for 10 years or more. Some researchers believe that there is little or no increased risk of breast cancer associated with short-term use of either HT with estrogen alone or estrogen combined with progestin. But long-term use is linked to an increased risk. Women on HT should continue to do monthly self breast exams and  get their mammograms as recommended by their caregiver. WHY IS MENOPAUSAL HORMONE THERAPY USED IN SPITE OF THE CANCER RISK? The known benefits of HT can improve the quality of life for many women, by reducing uncomfortable symptoms, as mentioned above. There also is evidence that HT helps prevent and treats osteoporosis. There is preliminary evidence that it can help prevent other problems associated with age, including colon cancer. The addition of progestin to the treatment has greatly reduced the risk of uterine cancer. ARE THERE OTHER DRUG THERAPIES KNOWN TO TREAT CONDITIONS RELATED TO MENOPAUSE? A class of antidepressant drugs called Selective Serotonin Reuptake Inhibitors (SSRIs) are effective in treating menopause-related symptoms of depression or mood changes. Vitamin E and Clonidine (drug typically used for high blood pressure) can help reduce hot flashes. To prevent osteoporosis, women who are at high risk for bone loss may be given drugs such as bisphosphonates, alendronate, raloxifene, calcium with vitamin D, calcitonin, and prescription medicines such as fasomax or boneva. Lastly, a class of cholesterol-lowering  drugs called HMG-CoA-reductase inhibitors (statins) are proven to be effective for reducing risk of heart disease. They are also being explored to prevent osteoporosis. No alternatives to estrogen exist for prevention of colon cancer  a disease for which early evidence suggests HT may be beneficial. WHO SHOULD NOT USE HT?  HT is often not recommended for women who have any of the following conditions:  Vaginal bleeding of an unknown cause.  Suspected breast cancer or history of breast cancer.  History of endometrial or uterine cancer.  Chronic disease of the liver.  History of heart disease.  History of blood clots in the veins or legs or in the lung (venous thrombosis). This includes women who have had thrombosis or blood clots during pregnancy or when taking birth control  pills. Although the risk of blood clots in women is very low, HT increases the risk.  Severe or uncontrolled high blood pressure.  Anyone who may be pregnant. HOW CAN I SORT THROUGH THE BENEFITS AND RISKS TO MAKE A GOOD DECISION ABOUT WHETHER OR NOT TO USE POSTMENOPAUSAL HORMONE THERAPY? Here are several helpful points, summarizing the findings of the [facility]) study:  First, it is important to know that because the study involved healthy women, only a small number of them had either a negative or positive effect from estrogen plus progestin therapy. The percentages describe what would happen to a whole population, not necessarily to any individual woman. Second, remember that percentages are not fate. Whether expressing risks or benefits, a percentage does not mean you will develop a disease. Many factors affect that likelihood, including:  Your lifestyle.  Environmental factors.  Heredity.  Your personal medical history. Realize that most treatments carry risks and benefits. No one can make a treatment choice for you. Talk with your caregiver and decide what is best for your health and quality of life. Begin by finding out your family history and your personal risk profile for:  Heart disease.  Stroke.  Breast cancer.  Osteoporosis.  Colorectal cancer.  Blood clots.  Other medical conditions. Document Released: 09/20/2002 Document Revised: 03/16/2011 Document Reviewed: 10/22/2008 [facility] Patient Information 2014 [place], [place].

## 2012-06-23 NOTE — Progress Notes (Signed)
Subjective:     Patient ID: Donna Blankenship, female   DOB: 06-12-1965, 47 y.o.   MRN: [id number]  HPI  Desire for hormonal therapy.  Prefers bioidentical.  Has a lot of questions regarding this. Wants to know exactly where to hormonal supply is coming from with the compounding Pharmacies.  Status post supracervical hysterectomy for fibroids. Patient presents with hot flashes, every 20 minutes.   Symptoms are increasing. Painful intercourse. Using olive oil and vitamin E, but not adequate. Mental fog. Not sleeping well.  Has not had mammogram yet this year.  Lost 20 pounds through exercise.    Family history of cardiovascular disease. FM - fatal MI - brother age 31 Stroke - father age late 23s - survived stroke.  Deceased from what sounds like a pulmonary embolus following a DVT.  Wants hormonal lab testing and cholesterol check.  Not fasting currently.  Review of Systems    See history of present illness. Objective:   Physical Exam    Pelvic exam - Normal external genitalia and urethra.  Cervix without lesions.  Rugae noted in the vagina. Uterus absent.  No adnexal masses or tenderness.  Assessment:     Menopausal symptoms. Atrophic vaginitis. Status post supracervical hysterectomy. Strong family history of cardiovascular disease. Overdue for mammogram.    Plan:     Long discussion with patient regarding hormonal therapy risks and benefits. I discussed with patient the role of bioidentical compounded and noncompounded hormonal treatment. I recommend mammogram.  [facility] Rx for vaginal Estrace.  See EPIC orders. I gave the patient the name of [facility] and [facility] to call them directly to ask her questions about their suppliers of hormones that they compound. If patient chooses to start estrogen hormonal therapy, I recommend transdermal option.   FSH, Estradiol, and lipid profile today.

## 2012-06-24 LAB — LIPID PANEL
Cholesterol: 222 mg/dL — ABNORMAL HIGH (ref 0–200)
Total CHOL/HDL Ratio: 3.1 Ratio

## 2012-06-24 LAB — FOLLICLE STIMULATING HORMONE: FSH: 123.1 m[IU]/mL — ABNORMAL HIGH

## 2012-06-24 NOTE — Addendum Note (Signed)
Addended by: Clide Dales R on: 06/24/2012 03:25 PM   Modules accepted: Orders

## 2012-07-05 ENCOUNTER — Ambulatory Visit (INDEPENDENT_AMBULATORY_CARE_PROVIDER_SITE_OTHER): Payer: BC Managed Care – PPO | Admitting: Family

## 2012-07-05 ENCOUNTER — Telehealth: Payer: Self-pay | Admitting: Family

## 2012-07-05 ENCOUNTER — Encounter: Payer: Self-pay | Admitting: Family

## 2012-07-05 ENCOUNTER — Ambulatory Visit: Payer: BC Managed Care – PPO

## 2012-07-05 VITALS — BP 136/90 | Temp 97.9°F | Wt 158.0 lb

## 2012-07-05 DIAGNOSIS — L282 Other prurigo: Secondary | ICD-10-CM

## 2012-07-05 DIAGNOSIS — L259 Unspecified contact dermatitis, unspecified cause: Secondary | ICD-10-CM

## 2012-07-05 MED ORDER — METHYLPREDNISOLONE ACETATE 80 MG/ML IJ SUSP
80.0000 mg | Freq: Once | INTRAMUSCULAR | Status: AC
  Administered 2012-07-05: 80 mg via INTRAMUSCULAR

## 2012-07-05 MED ORDER — METHYLPREDNISOLONE 4 MG PO KIT: PACK | ORAL | Status: AC

## 2012-07-05 NOTE — Telephone Encounter (Signed)
Patient Information:  Caller Name: Lashonna  Phone: [phone]  Patient: Donna Blankenship, Donna Blankenship  Gender: Female  DOB: [birth year]  Age: 47 Years  PCP: Adline Mango [facility])  Pregnant: No  Office Follow Up:  Does the office need to follow up with this patient?: No  Instructions For The Office: N/A  RN Note:  Patient states she started taking Ambien and Venlafaxine 06/19/12. Patient states she developed raised, red, itchy whelps on her left arm, onset 07/01/12. States rash extends from shoulder to elbow. States rash is also noted under right axilla and on the dorsum of both hands. No swelling of lips, tongue or face. No difficulty swallowing or breathing. Care advice given per guidelines. Call back parameters reviewed. Patient verbalizes understanding.  Symptoms  Reason For Call & Symptoms: Hives on left arm  Reviewed Health History In EMR: Yes  Reviewed Medications In EMR: Yes  Reviewed Allergies In EMR: Yes  Reviewed Surgeries / Procedures: Yes  Date of Onset of Symptoms: 07/01/2012  Treatments Tried: Hydrocortisone Cream, Benadryl  Treatments Tried Worked: No OB / GYN:  LMP: Unknown  Guideline(s) Used:  Rash - Widespread on Drugs - Drug Reaction  Disposition Per Guideline:   See Today in Office  Reason For Disposition Reached:   Hives  Advice Given:  For Itchy Rashes:  Wash the skin once with soap to remove any irritants. Use Benadryl or take an Aveeno bath to reduce the itching.  Oral Antihistamine Medication for Itching:  Take an antihistamine like diphenhydramine (Benadryl) for widespread rashes that itch. The adult dosage of Benadryl is 25-50 mg by mouth 4 times daily.  Oatmeal Aveeno Bath for Itching:  Sprinkle contents of one Aveeno packet under running faucet with comfortably warm water. Bathe for 15 - 20 minutes, 1-2 times daily.  Call Back If:  You become worse.  Patient Will Follow Care Advice:  YES  Appointment Scheduled:  07/05/2012  14:15:00 Appointment Scheduled Provider:  Adline Mango [facility])

## 2012-07-05 NOTE — Patient Instructions (Addendum)

## 2012-07-05 NOTE — Progress Notes (Signed)
Subjective:    Patient ID: Donna Blankenship, female    DOB: [birth year], 47 y.o.   MRN: [id number]  HPI  Pt is a 47 year old African American female who presents to PCP with rash to L upper arm x 1 week; pt reports that the rash is itchy; denies any shortness of breath, chest tightness or drainage from the rash. Denies any exposure to known allergens. States she has taken Benadryl for the rash, which only temporarily relieves the itching.   Review of Systems  Constitutional: Negative.   HENT: Negative.   Eyes: Negative.   Respiratory: Negative.   Cardiovascular: Negative.   Gastrointestinal: Negative.   Endocrine: Negative.   Genitourinary: Negative.   Musculoskeletal: Negative.   Skin: Positive for rash.  Allergic/Immunologic: Negative.   Neurological: Negative.   Hematological: Negative.   Psychiatric/Behavioral: Negative.    Past Medical History  Diagnosis Date  . Anemia     past H/o anemia  . Anxiety   . Diabetes mellitus without complication     borderline  . Fibroid 2004    abd hyst  . Thyroid disease     pasdt h/o hypothyroidism  . Urinary incontinence   . Chronic UTI (urinary tract infection)   . Infertility, female     History   Social History  . Marital Status: Married    Spouse Name: N/A    Number of Children: N/A  . Years of Education: N/A   Occupational History  . Not on file.   Social History Main Topics  . Smoking status: Never Smoker   . Smokeless tobacco: Not on file  . Alcohol Use: 1.0 oz/week    2 drink(s) per week     Comment: occ glass of wine  . Drug Use: No  . Sexually Active: Yes -- Female partner(s)   Other Topics Concern  . Not on file   Social History Narrative  . No narrative on file    Past Surgical History  Procedure Laterality Date  . Abdominal hysterectomy  2004    retained ovaries  . Pelvic laparoscopy  1999    Family History  Problem Relation Age of Onset  . Diabetes Mother   . Diabetes Father   . Stroke Father    . Anuerysm Father   . Sudden death Brother   . Heart attack Brother   . Diabetes Paternal Grandmother   . Breast cancer Maternal Grandmother     Allergies  Allergen Reactions  . Latex     Band aids and gloves  . Sulfamethoxazole     REACTION: unspecified    Current Outpatient Prescriptions on File Prior to Visit  Medication Sig Dispense Refill  . estradiol (ESTRACE) 0.1 MG/GM vaginal cream Use 1/2 g vaginally every night for the first 2 weeks, then use 1/2 g vaginally two or three times per week as needed to maintain symptom relief.  42.5 g  3  . glucose blood (ONE TOUCH ULTRA TEST) test strip CHECK BLOOD GLUCOSE ONCE A DAY AS NEEDED  50 each  6  . ONETOUCH DELICA LANCETS MISC       . zolpidem (AMBIEN) 10 MG tablet Take 1 tablet (10 mg total) by mouth at bedtime as needed for sleep.  30 tablet  1   No current facility-administered medications on file prior to visit.    BP 136/90  Temp(Src) 97.9 F (36.6 C) (Oral)  Wt 158 lb (71.668 kg)  BMI 25.51 kg/m2  LMP 01/01/2004chart  Objective:   Physical Exam  Constitutional: She appears well-developed and well-nourished.  HENT:  Head: Normocephalic and atraumatic.  Eyes: Conjunctivae are normal. Pupils are equal, round, and reactive to light.  Neck: Normal range of motion. Neck supple.  Cardiovascular: Normal rate and regular rhythm.   Pulmonary/Chest: Effort normal and breath sounds normal.  Abdominal: Soft. Bowel sounds are normal.  Musculoskeletal: Normal range of motion.  Skin: Rash noted. Rash is papular and urticarial.  Fine papular rash to L upper arm with no drainage or discharge; extending distally          Assessment & Plan:  1. Contact Dermatitis 2. Pruritic Rash  Pt administered IM Medrol shot in the office. A prescription for Medrol pack sent to pharmacy; pt instructed to follow up with PO steroids if no relief is achieved with the shot in the AM. Instructed to contact PCP if symptoms worsen or  persist.  Note by: S.Keah, FNP Student

## 2012-07-06 ENCOUNTER — Ambulatory Visit: Payer: BC Managed Care – PPO | Admitting: Obstetrics and Gynecology

## 2012-07-25 ENCOUNTER — Telehealth: Payer: Self-pay | Admitting: Family

## 2012-07-25 NOTE — Telephone Encounter (Signed)
Called and spoke with pt and pt is aware.  

## 2012-07-25 NOTE — Telephone Encounter (Signed)
Patient Information:  Caller Name: Tatelyn  Phone: [phone]  Patient: Collier Bullock  Gender: Female  DOB: 1965-01-18  Age: 47 Years  PCP: Adline Mango [facility])  Pregnant: No  Office Follow Up:  Does the office need to follow up with this patient?: No  Instructions For The Office: N/A  RN Note:  Did not take oral Prednisone; noted improvement of rash/itching after Prednisone injecton 07/05/12.  Rx antihistamine had no effect; continuted to use Benadryl.  Unable to to sleep due to Benadryl.  Instructed to stop Benadryl 8 hours before appointment.  Declined to see another provider 07/25/12; scheduled for 07/26/12 with PCP per caller request.    Symptoms  Reason For Call & Symptoms: Continue itching of neck and right arm and fine papular rash on left arm; hives develop on left arm if Benadryl dose delayed or scratches rash.  Reviewed Health History In EMR: Yes  Reviewed Medications In EMR: Yes  Reviewed Allergies In EMR: Yes  Reviewed Surgeries / Procedures: Yes  Date of Onset of Symptoms: 07/05/2012  Treatments Tried: Prednisone injection, Benadryl po TID, Hydrocortisone cream  Treatments Tried Worked: Yes OB / GYN:  LMP: Unknown  Guideline(s) Used:  Rash or Redness - Widespread  Hives  Disposition Per Guideline:   See Today in Office  Reason For Disposition Reached:   Moderate-Severe hives persist (i.e., hives interfere with normal activities or work) and taking antihistamine (e.g., Benadryl, Claritin) > 24 hours  Advice Given:  Call Back If:  Severe hives or severe itching persist more than 24 hours despite taking an antihistamine (e.g., Benadryl)  Hives last more than 1 week  You become worse.  Patient Will Follow Care Advice:  YES  Appointment Scheduled:  07/26/2012 11:30:00 Appointment Scheduled Provider:  Adline Mango [facility])

## 2012-07-25 NOTE — Telephone Encounter (Signed)
Please advise to take Prednisone

## 2012-07-26 ENCOUNTER — Ambulatory Visit: Payer: Self-pay | Admitting: Family

## 2012-08-03 ENCOUNTER — Telehealth: Payer: Self-pay | Admitting: Family

## 2012-08-03 ENCOUNTER — Encounter: Payer: Self-pay | Admitting: Family

## 2012-08-03 ENCOUNTER — Ambulatory Visit (INDEPENDENT_AMBULATORY_CARE_PROVIDER_SITE_OTHER): Payer: BC Managed Care – PPO | Admitting: Family

## 2012-08-03 VITALS — BP 130/90 | HR 72 | Wt 154.0 lb

## 2012-08-03 DIAGNOSIS — B360 Pityriasis versicolor: Secondary | ICD-10-CM

## 2012-08-03 DIAGNOSIS — G4700 Insomnia, unspecified: Secondary | ICD-10-CM

## 2012-08-03 MED ORDER — TRAZODONE HCL 50 MG PO TABS: 25.0000 mg | ORAL_TABLET | Freq: Every evening | ORAL | Status: DC | PRN

## 2012-08-03 MED ORDER — FLUCONAZOLE 200 MG PO TABS: 200.0000 mg | ORAL_TABLET | Freq: Every day | ORAL | Status: DC

## 2012-08-03 NOTE — Telephone Encounter (Signed)
PT is requesting clarifications in regards to a shampoo and conditioner that you recommended. Should she get an anti- dandruff or the regular one? Please assist.

## 2012-08-03 NOTE — Telephone Encounter (Signed)
Please advise 

## 2012-08-03 NOTE — Patient Instructions (Addendum)
Tinea Versicolor Tinea versicolor is a common yeast infection of the skin. This condition becomes known when the yeast on our skin starts to overgrow (yeast is a normal inhabitant on our skin). This condition is noticed as white or light brown patches on brown skin, and is more evident in the summer on tanned skin. These areas are slightly scaly if scratched. The light patches from the yeast become evident when the yeast creates "holes in your suntan". This is most often noticed in the summer. The patches are usually located on the chest, back, pubis, neck and body folds. However, it may occur on any area of body. Mild itching and inflammation (redness or soreness) may be present. DIAGNOSIS  The diagnosisof this is made clinically (by looking). Cultures from samples are usually not needed. Examination under the microscope may help. However, yeast is normally found on skin. The diagnosis still remains clinical. Examination under Wood's Ultraviolet Light can determine the extent of the infection. TREATMENT  This common infection is usually only of cosmetic (only a concern to your appearance). It is easily treated with dandruff shampoo used during showers or bathing. Vigorous scrubbing will eliminate the yeast over several days time. The light areas in your skin may remain for weeks or months after the infection is cured unless your skin is exposed to sunlight. The lighter or darker spots caused by the fungus that remain after complete treatment are not a sign of treatment failure; it will take a long time to resolve. Your caregiver may recommend a number of commercial preparations or medication by mouth if home care is not working. Recurrence is common and preventative medication may be necessary. This skin condition is not highly contagious. Special care is not needed to protect close friends and family members. Normal hygiene is usually enough. Follow up is required only if you develop complications (such as a  secondary infection from scratching), if recommended by your caregiver, or if no relief is obtained from the preparations used. Document Released: 12/20/1999 Document Revised: 03/16/2011 Document Reviewed: 02/01/2008 [facility] Patient Information 2014 [place], [place].  Insomnia Insomnia is frequent trouble falling and/or staying asleep. Insomnia can be a long term problem or a short term problem. Both are common. Insomnia can be a short term problem when the wakefulness is related to a certain stress or worry. Long term insomnia is often related to ongoing stress during waking hours and/or poor sleeping habits. Overtime, sleep deprivation itself can make the problem worse. Every little thing feels more severe because you are overtired and your ability to cope is decreased. CAUSES   Stress, anxiety, and depression.  Poor sleeping habits.  Distractions such as TV in the bedroom.  Naps close to bedtime.  Engaging in emotionally charged conversations before bed.  Technical reading before sleep.  Alcohol and other sedatives. They may make the problem worse. They can hurt normal sleep patterns and normal dream activity.  Stimulants such as caffeine for several hours prior to bedtime.  Pain syndromes and shortness of breath can cause insomnia.  Exercise late at night.  Changing time zones may cause sleeping problems (jet lag). It is sometimes helpful to have someone observe your sleeping patterns. They should look for periods of not breathing during the night (sleep apnea). They should also look to see how long those periods last. If you live alone or observers are uncertain, you can also be observed at a sleep clinic where your sleep patterns will be professionally monitored. Sleep apnea requires a  checkup and treatment. Give your caregivers your medical history. Give your caregivers observations your family has made about your sleep.  SYMPTOMS   Not feeling rested in the morning.  Anxiety  and restlessness at bedtime.  Difficulty falling and staying asleep. TREATMENT   Your caregiver may prescribe treatment for an underlying medical disorders. Your caregiver can give advice or help if you are using alcohol or other drugs for self-medication. Treatment of underlying problems will usually eliminate insomnia problems.  Medications can be prescribed for short time use. They are generally not recommended for lengthy use.  Over-the-counter sleep medicines are not recommended for lengthy use. They can be habit forming.  You can promote easier sleeping by making lifestyle changes such as:  Using relaxation techniques that help with breathing and reduce muscle tension.  Exercising earlier in the day.  Changing your diet and the time of your last meal. No night time snacks.  Establish a regular time to go to bed.  Counseling can help with stressful problems and worry.  Soothing music and white noise may be helpful if there are background noises you cannot remove.  Stop tedious detailed work at least one hour before bedtime. HOME CARE INSTRUCTIONS   Keep a diary. Inform your caregiver about your progress. This includes any medication side effects. See your caregiver regularly. Take note of:  Times when you are asleep.  Times when you are awake during the night.  The quality of your sleep.  How you feel the next day. This information will help your caregiver care for you.  Get out of bed if you are still awake after 15 minutes. Read or do some quiet activity. Keep the lights down. Wait until you feel sleepy and go back to bed.  Keep regular sleeping and waking hours. Avoid naps.  Exercise regularly.  Avoid distractions at bedtime. Distractions include watching television or engaging in any intense or detailed activity like attempting to balance the household checkbook.  Develop a bedtime ritual. Keep a familiar routine of bathing, brushing your teeth, climbing into  bed at the same time each night, listening to soothing music. Routines increase the success of falling to sleep faster.  Use relaxation techniques. This can be using breathing and muscle tension release routines. It can also include visualizing peaceful scenes. You can also help control troubling or intruding thoughts by keeping your mind occupied with boring or repetitive thoughts like the old concept of counting sheep. You can make it more creative like imagining planting one beautiful flower after another in your backyard garden.  During your day, work to eliminate stress. When this is not possible use some of the previous suggestions to help reduce the anxiety that accompanies stressful situations. MAKE SURE YOU:   Understand these instructions.  Will watch your condition.  Will get help right away if you are not doing well or get worse. Document Released: 12/20/1999 Document Revised: 03/16/2011 Document Reviewed: 01/19/2007 [facility] Patient Information 2014 [place], [place].

## 2012-08-03 NOTE — Telephone Encounter (Signed)
If she has dandruff, get it. If not, get regular.

## 2012-08-03 NOTE — Progress Notes (Signed)
Subjective:    Patient ID: Donna Blankenship, female    DOB: 1965/01/23, 47 y.o.   MRN: [id number]  HPI 47 year old African American female, nonsmoker his family persistent rash on left arm X1 month. She describes it is itchy, and patchy with discoloration. She's been taking Benadryl to help station but it's not resolved the rash. He has stopped all of her medications and herbal supplements. Denies any changes in detergents, soaps, lotions.  Patient continues to complain of insomnia. In the past, she's tried [place] and Ambien have not helped. Reports Ambien causing hallucinations. She's also try melatonin and magnesium and isn't sure if it helped. Patient was prescribed an anti-anxiety medication, SSRI but she had discontinued taking it.   Review of Systems  Constitutional: Positive for fatigue.  HENT: Negative.   Eyes: Negative.   Respiratory: Negative.   Cardiovascular: Negative.   Gastrointestinal: Negative.   Endocrine: Negative.   Musculoskeletal: Negative.   Skin: Positive for rash. Negative for color change and wound.  Allergic/Immunologic: Negative.   Neurological: Negative.   Psychiatric/Behavioral: Positive for sleep disturbance. The patient is nervous/anxious.    Past Medical History  Diagnosis Date  . Anemia     past H/o anemia  . Anxiety   . Diabetes mellitus without complication     borderline  . Fibroid 2004    abd hyst  . Thyroid disease     pasdt h/o hypothyroidism  . Urinary incontinence   . Chronic UTI (urinary tract infection)   . Infertility, female     History   Social History  . Marital Status: Married    Spouse Name: N/A    Number of Children: N/A  . Years of Education: N/A   Occupational History  . Not on file.   Social History Main Topics  . Smoking status: Never Smoker   . Smokeless tobacco: Not on file  . Alcohol Use: 1.0 oz/week    2 drink(s) per week     Comment: occ glass of wine  . Drug Use: No  . Sexually Active: Yes -- Female  partner(s)   Other Topics Concern  . Not on file   Social History Narrative  . No narrative on file    Past Surgical History  Procedure Laterality Date  . Abdominal hysterectomy  2004    retained ovaries  . Pelvic laparoscopy  1999    Family History  Problem Relation Age of Onset  . Diabetes Mother   . Diabetes Father   . Stroke Father   . Anuerysm Father   . Sudden death Brother   . Heart attack Brother   . Diabetes Paternal Grandmother   . Breast cancer Maternal Grandmother     Allergies  Allergen Reactions  . Latex     Band aids and gloves  . Sulfamethoxazole     REACTION: unspecified    Current Outpatient Prescriptions on File Prior to Visit  Medication Sig Dispense Refill  . estradiol (ESTRACE) 0.1 MG/GM vaginal cream Use 1/2 g vaginally every night for the first 2 weeks, then use 1/2 g vaginally two or three times per week as needed to maintain symptom relief.  42.5 g  3  . glucose blood (ONE TOUCH ULTRA TEST) test strip CHECK BLOOD GLUCOSE ONCE A DAY AS NEEDED  50 each  6  . ONETOUCH DELICA LANCETS MISC       . zolpidem (AMBIEN) 10 MG tablet Take 1 tablet (10 mg total) by mouth at bedtime as needed  for sleep.  30 tablet  1   No current facility-administered medications on file prior to visit.    BP 130/90  Pulse 72  Wt 154 lb (69.854 kg)  BMI 24.87 kg/m2  SpO2 94%  LMP 01/01/2004chart    Objective:   Physical Exam  Constitutional: She is oriented to person, place, and time. She appears well-developed and well-nourished.  Neck: Normal range of motion. Neck supple. No thyromegaly present.  Cardiovascular: Normal rate, regular rhythm and normal heart sounds.   Pulmonary/Chest: Breath sounds normal.  Musculoskeletal: Normal range of motion.  Neurological: She is alert and oriented to person, place, and time.  Skin: Skin is warm and dry. Rash noted.     Psychiatric: She has a normal mood and affect.          Assessment & Plan:  Assessment: 1.  Tinea versicolor 2. Insomnia 3. Anxiety  Plan: Selsun Blue shampoo applied to the left arm daily, leave on 10 minutes. We'll treat with Diflucan 200 mg once weekly x4 weeks. Start trazodone 50 mg once daily at bedtime for sleep. Patient I have is if any questions or concerns. Check a schedule, and as needed.

## 2012-08-03 NOTE — Telephone Encounter (Signed)
Pt aware and verbalized understanding.  

## 2012-08-05 ENCOUNTER — Ambulatory Visit: Payer: BC Managed Care – PPO | Admitting: Family

## 2012-08-19 ENCOUNTER — Ambulatory Visit (INDEPENDENT_AMBULATORY_CARE_PROVIDER_SITE_OTHER): Payer: BC Managed Care – PPO | Admitting: Family

## 2012-08-19 ENCOUNTER — Encounter: Payer: Self-pay | Admitting: Family

## 2012-08-19 VITALS — BP 130/88 | HR 88 | Wt 154.0 lb

## 2012-08-19 DIAGNOSIS — L259 Unspecified contact dermatitis, unspecified cause: Secondary | ICD-10-CM

## 2012-08-19 DIAGNOSIS — L309 Dermatitis, unspecified: Secondary | ICD-10-CM

## 2012-08-19 DIAGNOSIS — J309 Allergic rhinitis, unspecified: Secondary | ICD-10-CM

## 2012-08-19 NOTE — Patient Instructions (Addendum)

## 2012-08-22 NOTE — Progress Notes (Signed)
Subjective:    Patient ID: Donna Blankenship, female    DOB: 10-Nov-1965, 47 y.o.   MRN: [id number]  HPI  47 year old [place] American female, nonsmoker is in with complaints of whelped-appearing rash to her chest and arms. She saw dermatology yesterday and prescribed Vistaril 25 mg 1-2 daily. She is concerned that she may have further allergies. She has seen an allergist in the past and had a basic allergy panel performed that revealed an allergy to various and grasses. Dermatology also requested that she be checked for lupus.  Review of Systems  Constitutional: Negative.   HENT: Negative.   Respiratory: Negative.   Cardiovascular: Negative.   Skin: Positive for rash.       Whelped appearing rash to chest and arms  Allergic/Immunologic: Positive for environmental allergies.  Psychiatric/Behavioral: Negative.    Past Medical History  Diagnosis Date  . Anemia     past H/o anemia  . Anxiety   . Diabetes mellitus without complication     borderline  . Fibroid 2004    abd hyst  . Thyroid disease     pasdt h/o hypothyroidism  . Urinary incontinence   . Chronic UTI (urinary tract infection)   . Infertility, female     History   Social History  . Marital Status: Married    Spouse Name: N/A    Number of Children: N/A  . Years of Education: N/A   Occupational History  . Not on file.   Social History Main Topics  . Smoking status: Never Smoker   . Smokeless tobacco: Not on file  . Alcohol Use: 1.0 oz/week    2 drink(s) per week     Comment: occ glass of wine  . Drug Use: No  . Sexual Activity: Yes    Partners: Male   Other Topics Concern  . Not on file   Social History Narrative  . No narrative on file    Past Surgical History  Procedure Laterality Date  . Abdominal hysterectomy  2004    retained ovaries  . Pelvic laparoscopy  1999    Family History  Problem Relation Age of Onset  . Diabetes Mother   . Diabetes Father   . Stroke Father   . Anuerysm Father    . Sudden death Brother   . Heart attack Brother   . Diabetes Paternal Grandmother   . Breast cancer Maternal Grandmother     Allergies  Allergen Reactions  . Latex     Band aids and gloves  . Sulfamethoxazole     REACTION: unspecified    Current Outpatient Prescriptions on File Prior to Visit  Medication Sig Dispense Refill  . estradiol (ESTRACE) 0.1 MG/GM vaginal cream Use 1/2 g vaginally every night for the first 2 weeks, then use 1/2 g vaginally two or three times per week as needed to maintain symptom relief.  42.5 g  3  . Eszopiclone (ESZOPICLONE) 3 MG TABS Take 3 mg by mouth at bedtime. Take immediately before bedtime      . fluconazole (DIFLUCAN) 200 MG tablet Take 1 tablet (200 mg total) by mouth daily.  4 tablet  0  . glucose blood (ONE TOUCH ULTRA TEST) test strip CHECK BLOOD GLUCOSE ONCE A DAY AS NEEDED  50 each  6  . ONETOUCH DELICA LANCETS MISC       . traZODone (DESYREL) 50 MG tablet Take 0.5-1 tablets (25-50 mg total) by mouth at bedtime as needed for sleep.  30 tablet  3  . zolpidem (AMBIEN) 10 MG tablet Take 1 tablet (10 mg total) by mouth at bedtime as needed for sleep.  30 tablet  1   No current facility-administered medications on file prior to visit.    BP 130/88  Pulse 88  Wt 154 lb (69.854 kg)  BMI 24.87 kg/m2  LMP 01/01/2004chart    Objective:   Physical Exam  Constitutional: She is oriented to person, place, and time. She appears well-developed and well-nourished.  HENT:  Right Ear: External ear normal.  Left Ear: External ear normal.  Nose: Nose normal.  Mouth/Throat: Oropharynx is clear and moist.  Neck: Normal range of motion. Neck supple. No thyromegaly present.  Cardiovascular: Normal rate, regular rhythm and normal heart sounds.   Pulmonary/Chest: Effort normal and breath sounds normal.  Musculoskeletal: Normal range of motion.  Neurological: She is alert and oriented to person, place, and time. She has normal reflexes.  Skin: Skin is  warm and dry. Rash noted.  Widely spread, whelped appearing rash noted to the chest, arms bilaterally. Non tender. Pruritic.   Psychiatric: She has a normal mood and affect.          Assessment & Plan:  Assessment:  1. Allergic dermatitis 2. Hypothyroidism  Plan: Anything. The patient had a results. Encouraged her to followup with dermatology and the allergist as scheduled. Here when necessary.

## 2012-08-24 ENCOUNTER — Other Ambulatory Visit: Payer: Self-pay

## 2012-08-24 MED ORDER — VENLAFAXINE HCL ER 37.5 MG PO CP24: 37.5000 mg | ORAL_CAPSULE | Freq: Every day | ORAL | Status: DC

## 2012-08-30 ENCOUNTER — Telehealth: Payer: Self-pay | Admitting: Family

## 2012-08-30 MED ORDER — HYDROCOD POLST-CHLORPHEN POLST 10-8 MG/5ML PO LQCR: 5.0000 mL | Freq: Two times a day (BID) | ORAL | Status: DC | PRN

## 2012-08-30 NOTE — Telephone Encounter (Signed)
Rx phoned in.   

## 2012-08-30 NOTE — Telephone Encounter (Signed)
Pt was seen on 08-19-12. Pt has cough and would like cough syrup call into  [facility] [address]

## 2012-08-30 NOTE — Telephone Encounter (Signed)
Please call in Tussionex

## 2012-09-02 ENCOUNTER — Ambulatory Visit: Payer: BC Managed Care – PPO | Admitting: Internal Medicine

## 2012-09-02 ENCOUNTER — Telehealth: Payer: Self-pay | Admitting: Family

## 2012-09-02 NOTE — Telephone Encounter (Signed)
Referral comes from dermatology so that they can send labs with the referral. I do not have labs.

## 2012-09-02 NOTE — Telephone Encounter (Signed)
Left message to advise pt of Padonda's message

## 2012-09-02 NOTE — Telephone Encounter (Signed)
Patient is requesting a referral to a Rheumatologist. She did blood work with Dermatologist for ongoing skin rash. Her ANA test came back positive, so she'd like the referral for that. Please advise.   She states that most places she called are booked out 6+ weeks. She is willing to go to [facility] if she can get in sooner.

## 2012-09-21 ENCOUNTER — Other Ambulatory Visit: Payer: Self-pay

## 2012-09-21 MED ORDER — TRAZODONE HCL 50 MG PO TABS: 25.0000 mg | ORAL_TABLET | Freq: Every evening | ORAL | Status: DC | PRN

## 2012-10-04 ENCOUNTER — Encounter: Payer: Self-pay | Admitting: Family

## 2012-10-04 ENCOUNTER — Ambulatory Visit (INDEPENDENT_AMBULATORY_CARE_PROVIDER_SITE_OTHER): Payer: BC Managed Care – PPO | Admitting: Family

## 2012-10-04 VITALS — BP 128/86 | HR 61 | Wt 156.0 lb

## 2012-10-04 DIAGNOSIS — F411 Generalized anxiety disorder: Secondary | ICD-10-CM

## 2012-10-04 DIAGNOSIS — R195 Other fecal abnormalities: Secondary | ICD-10-CM

## 2012-10-04 DIAGNOSIS — R51 Headache: Secondary | ICD-10-CM

## 2012-10-04 DIAGNOSIS — R519 Headache, unspecified: Secondary | ICD-10-CM

## 2012-10-04 MED ORDER — DIAZEPAM 5 MG PO TABS: 5.0000 mg | ORAL_TABLET | Freq: Two times a day (BID) | ORAL | Status: DC | PRN

## 2012-10-04 NOTE — Progress Notes (Signed)
Subjective:    Patient ID: Donna Blankenship, female    DOB: 1965-09-29, 47 y.o.   MRN: [id number]  HPI  47 year old [place] American female, nonsmoker is in with persistent complaints of hair loss and scalp tightness and tingling to the posterior aspect of her heel. She seen dermatology who has performed a biopsy. She is awaiting the results now. Reports she's tried several cortisone ointments and creams have been ineffective. She did start taking the Effexor to try to help with anxiety and stress levels. Patient strongly feels that she has a fungus inside of her intestines causing her to have hair loss. She reportedly rate something on the Internet. Dermatology as done skin scrapings and have not found that on her skin.    Review of Systems  Constitutional: Negative.   Respiratory: Negative.   Cardiovascular: Negative.   Gastrointestinal: Negative.   Genitourinary: Negative.   Musculoskeletal: Negative.   Skin: Negative.        Tingling and tightness to the scalp posteriorly.   Neurological: Negative.   Psychiatric/Behavioral: Positive for sleep disturbance. Negative for hallucinations, confusion, self-injury and agitation. The patient is nervous/anxious. The patient is not hyperactive.    Past Medical History  Diagnosis Date  . Anemia     past H/o anemia  . Anxiety   . Diabetes mellitus without complication     borderline  . Fibroid 2004    abd hyst  . Thyroid disease     pasdt h/o hypothyroidism  . Urinary incontinence   . Chronic UTI (urinary tract infection)   . Infertility, female     History   Social History  . Marital Status: Married    Spouse Name: N/A    Number of Children: N/A  . Years of Education: N/A   Occupational History  . Not on file.   Social History Main Topics  . Smoking status: Never Smoker   . Smokeless tobacco: Not on file  . Alcohol Use: 1.0 oz/week    2 drink(s) per week     Comment: occ glass of wine  . Drug Use: No  . Sexual Activity:  Yes    Partners: Male   Other Topics Concern  . Not on file   Social History Narrative  . No narrative on file    Past Surgical History  Procedure Laterality Date  . Abdominal hysterectomy  2004    retained ovaries  . Pelvic laparoscopy  1999    Family History  Problem Relation Age of Onset  . Diabetes Mother   . Diabetes Father   . Stroke Father   . Anuerysm Father   . Sudden death Brother   . Heart attack Brother   . Diabetes Paternal Grandmother   . Breast cancer Maternal Grandmother     Allergies  Allergen Reactions  . Latex     Band aids and gloves  . Sulfamethoxazole     REACTION: unspecified    Current Outpatient Prescriptions on File Prior to Visit  Medication Sig Dispense Refill  . estradiol (ESTRACE) 0.1 MG/GM vaginal cream Use 1/2 g vaginally every night for the first 2 weeks, then use 1/2 g vaginally two or three times per week as needed to maintain symptom relief.  42.5 g  3  . Eszopiclone (ESZOPICLONE) 3 MG TABS Take 3 mg by mouth at bedtime. Take immediately before bedtime      . glucose blood (ONE TOUCH ULTRA TEST) test strip CHECK BLOOD GLUCOSE ONCE A DAY AS NEEDED  50 each  6  . ONETOUCH DELICA LANCETS MISC       . traZODone (DESYREL) 50 MG tablet Take 0.5-1 tablets (25-50 mg total) by mouth at bedtime as needed for sleep.  30 tablet  3  . venlafaxine XR (EFFEXOR XR) 37.5 MG 24 hr capsule Take 1 capsule (37.5 mg total) by mouth daily.  30 capsule  0  . chlorpheniramine-HYDROcodone (TUSSIONEX PENNKINETIC ER) 10-8 MG/5ML LQCR Take 5 mLs by mouth every 12 (twelve) hours as needed.  140 mL  0  . fluconazole (DIFLUCAN) 200 MG tablet Take 1 tablet (200 mg total) by mouth daily.  4 tablet  0  . zolpidem (AMBIEN) 10 MG tablet Take 1 tablet (10 mg total) by mouth at bedtime as needed for sleep.  30 tablet  1   No current facility-administered medications on file prior to visit.    BP 128/86  Pulse 61  Wt 156 lb (70.761 kg)  BMI 25.19 kg/m2  LMP  01/01/2004chart    Objective:   Physical Exam  Constitutional: She is oriented to person, place, and time. She appears well-developed and well-nourished.  Neck: Normal range of motion. Neck supple.  Cardiovascular: Normal rate, regular rhythm and normal heart sounds.   Pulmonary/Chest: Effort normal and breath sounds normal.  Abdominal: Soft. Bowel sounds are normal.  Musculoskeletal: Normal range of motion.  Neurological: She is alert and oriented to person, place, and time. She has normal reflexes.  Skin: Skin is warm and dry. No erythema.  Hair thinning to the posterior scalp.  Psychiatric: She has a normal mood and affect.          Assessment & Plan:  Assessment: 1. Hair loss 2. Anxiety 3. Scalp pain  Plan: Patient will obtain a stool culture to send for O&Pwill notify patient of results. Refer to neurology to see if there is a neurological source for the tingling and pain in the posterior scalp. I strongly believe that her symptoms are related to anxiety. She has agreed to try Valium 2.5-5 mg when she experiences a tingling sensation in her scalp.

## 2012-10-11 LAB — STOOL CULTURE

## 2012-10-12 ENCOUNTER — Ambulatory Visit: Payer: BC Managed Care – PPO | Admitting: Neurology

## 2012-10-24 ENCOUNTER — Ambulatory Visit: Payer: BC Managed Care – PPO | Admitting: Neurology

## 2012-12-09 ENCOUNTER — Encounter: Payer: Self-pay | Admitting: *Deleted

## 2012-12-09 ENCOUNTER — Ambulatory Visit: Payer: BC Managed Care – PPO | Admitting: Family Medicine

## 2012-12-12 ENCOUNTER — Encounter: Payer: Self-pay | Admitting: Family

## 2012-12-12 ENCOUNTER — Ambulatory Visit (INDEPENDENT_AMBULATORY_CARE_PROVIDER_SITE_OTHER): Payer: BC Managed Care – PPO | Admitting: Family

## 2012-12-12 VITALS — BP 136/90 | HR 67 | Temp 97.7°F | Wt 163.0 lb

## 2012-12-12 DIAGNOSIS — J029 Acute pharyngitis, unspecified: Secondary | ICD-10-CM

## 2012-12-12 DIAGNOSIS — J309 Allergic rhinitis, unspecified: Secondary | ICD-10-CM

## 2012-12-12 DIAGNOSIS — L658 Other specified nonscarring hair loss: Secondary | ICD-10-CM

## 2012-12-12 NOTE — Patient Instructions (Signed)

## 2012-12-12 NOTE — Progress Notes (Signed)
Pre visit review using our clinic review tool, if applicable. No additional management support is needed unless otherwise documented below in the visit note. 

## 2012-12-12 NOTE — Progress Notes (Signed)
Subjective:    Patient ID: Donna Blankenship, female    DOB: 12-01-1965, 47 y.o.   MRN: [id number]  HPI 47 year old African American female is in today with complaints of sore throat, cough, congestion x5 days. She initially started taking Claritin but has recently changed to Zyrtec that is improved her symptoms. She also continues to have concerns of traction alopecia for which she is under the care dermatology. I had previously referred her to a Isaac Laud who has a week until July 2015. She is currently seeing a local dermatologist.   Review of Systems  Constitutional: Negative.   HENT: Positive for congestion, sinus pressure, sneezing and sore throat.   Respiratory: Positive for cough.   Cardiovascular: Negative.   Endocrine: Negative.   Genitourinary: Negative.   Musculoskeletal: Negative.   Skin: Negative.   Allergic/Immunologic: Negative.   Neurological: Negative.   Psychiatric/Behavioral: Negative.    Past Medical History  Diagnosis Date  . Anemia     past H/o anemia  . Anxiety   . Diabetes mellitus without complication     borderline  . Fibroid 2004    abd hyst  . Thyroid disease     pasdt h/o hypothyroidism  . Urinary incontinence   . Chronic UTI (urinary tract infection)   . Infertility, female     History   Social History  . Marital Status: Married    Spouse Name: N/A    Number of Children: N/A  . Years of Education: N/A   Occupational History  . Not on file.   Social History Main Topics  . Smoking status: Never Smoker   . Smokeless tobacco: Not on file  . Alcohol Use: 1.0 oz/week    2 drink(s) per week     Comment: occ glass of wine  . Drug Use: No  . Sexual Activity: Yes    Partners: Male   Other Topics Concern  . Not on file   Social History Narrative  . No narrative on file    Past Surgical History  Procedure Laterality Date  . Abdominal hysterectomy  2004    retained ovaries  . Pelvic laparoscopy  1999    Family History    Problem Relation Age of Onset  . Diabetes Mother   . Diabetes Father   . Stroke Father   . Anuerysm Father   . Sudden death Brother   . Heart attack Brother   . Diabetes Paternal Grandmother   . Breast cancer Maternal Grandmother     Allergies  Allergen Reactions  . Latex     Band aids and gloves  . Sulfamethoxazole     REACTION: unspecified    Current Outpatient Prescriptions on File Prior to Visit  Medication Sig Dispense Refill  . diazepam (VALIUM) 5 MG tablet Take 1 tablet (5 mg total) by mouth every 12 (twelve) hours as needed for anxiety.  30 tablet  1  . estradiol (ESTRACE) 0.1 MG/GM vaginal cream Use 1/2 g vaginally every night for the first 2 weeks, then use 1/2 g vaginally two or three times per week as needed to maintain symptom relief.  42.5 g  3  . Eszopiclone (ESZOPICLONE) 3 MG TABS Take 3 mg by mouth at bedtime. Take immediately before bedtime      . fluconazole (DIFLUCAN) 200 MG tablet Take 1 tablet (200 mg total) by mouth daily.  4 tablet  0  . glucose blood (ONE TOUCH ULTRA TEST) test strip CHECK BLOOD GLUCOSE ONCE A  DAY AS NEEDED  50 each  6  . ONETOUCH DELICA LANCETS MISC       . traZODone (DESYREL) 50 MG tablet Take 0.5-1 tablets (25-50 mg total) by mouth at bedtime as needed for sleep.  30 tablet  3  . venlafaxine XR (EFFEXOR XR) 37.5 MG 24 hr capsule Take 1 capsule (37.5 mg total) by mouth daily.  30 capsule  0  . chlorpheniramine-HYDROcodone (TUSSIONEX PENNKINETIC ER) 10-8 MG/5ML LQCR Take 5 mLs by mouth every 12 (twelve) hours as needed.  140 mL  0  . zolpidem (AMBIEN) 10 MG tablet Take 1 tablet (10 mg total) by mouth at bedtime as needed for sleep.  30 tablet  1   No current facility-administered medications on file prior to visit.    BP 136/90  Pulse 67  Temp(Src) 97.7 F (36.5 C) (Oral)  Wt 163 lb (73.936 kg)  LMP 01/01/2004chart    Objective:   Physical Exam  Constitutional: She is oriented to person, place, and time. She appears  well-developed and well-nourished.  HENT:  Right Ear: External ear normal.  Left Ear: External ear normal.  Nose: Nose normal.  Mouth/Throat: Oropharynx is clear and moist.  Neck: Normal range of motion. Neck supple.  Cardiovascular: Normal rate, regular rhythm and normal heart sounds.   Pulmonary/Chest: Effort normal and breath sounds normal.  Musculoskeletal: Normal range of motion.  Neurological: She is alert and oriented to person, place, and time.  Skin: Skin is warm and dry.  Psychiatric: She has a normal mood and affect.          assessment: 1. Allergic rhinitis 2. Pharyngitis 3. Traction alopecia  Plan: Continue Zyrtec once daily. Advised her to continue to stand in a care dermatology. Consider minoxidil over-the-counter after consulting with them. See Dr. Isaac Laud as scheduled.

## 2013-01-04 ENCOUNTER — Encounter: Payer: Self-pay | Admitting: Obstetrics and Gynecology

## 2013-01-06 ENCOUNTER — Telehealth: Payer: Self-pay | Admitting: Family

## 2013-01-06 MED ORDER — VENLAFAXINE HCL ER 37.5 MG PO CP24: 37.5000 mg | ORAL_CAPSULE | Freq: Every day | ORAL | Status: DC

## 2013-01-06 NOTE — Telephone Encounter (Signed)
Rx sent to pharmacy   

## 2013-01-06 NOTE — Telephone Encounter (Signed)
Requesting refill of venlafaxine XR (EFFEXOR XR) 37.5 MG 24 hr capsule. Pharmacy [facility] [address]  Pt states she has contacted pharmacy but medication was not approved for refill, she is currently out of medication.

## 2013-02-24 ENCOUNTER — Encounter: Payer: Self-pay | Admitting: Obstetrics and Gynecology

## 2013-04-19 ENCOUNTER — Encounter: Payer: Self-pay | Admitting: Family

## 2013-04-19 ENCOUNTER — Ambulatory Visit (INDEPENDENT_AMBULATORY_CARE_PROVIDER_SITE_OTHER): Payer: BC Managed Care – PPO | Admitting: Family

## 2013-04-19 VITALS — BP 118/78 | HR 88 | Wt 160.0 lb

## 2013-04-19 DIAGNOSIS — S99929A Unspecified injury of unspecified foot, initial encounter: Secondary | ICD-10-CM

## 2013-04-19 DIAGNOSIS — S8990XA Unspecified injury of unspecified lower leg, initial encounter: Secondary | ICD-10-CM

## 2013-04-19 DIAGNOSIS — M25511 Pain in right shoulder: Secondary | ICD-10-CM

## 2013-04-19 DIAGNOSIS — M25519 Pain in unspecified shoulder: Secondary | ICD-10-CM

## 2013-04-19 DIAGNOSIS — S99919A Unspecified injury of unspecified ankle, initial encounter: Secondary | ICD-10-CM

## 2013-04-19 DIAGNOSIS — S99921A Unspecified injury of right foot, initial encounter: Secondary | ICD-10-CM

## 2013-04-19 NOTE — Patient Instructions (Signed)

## 2013-04-19 NOTE — Progress Notes (Signed)
Subjective:    Patient ID: Donna Blankenship, female    DOB: August 19, 1965, 48 y.o.   MRN: [id number]  HPI 48 year old [place] female, nonsmoker is in today with complaints of an injury to her right second toe one and a half weeks ago when she injured it getting a bathtub. Since that time she continues to have swelling and pain with bending. She has taken ibuprofen once. No other therapy is initiated.  Patient complains of right shoulder pain that dates back several years. She had physical therapy to the right shoulder that helped. She is unsure what the source of the pain was. Had an MRI done approximately 2 years ago. Reports is beginning to feel tight again. Worse with lying. Plan a 4/10. Has not tried any medication for relief.  Review of Systems  Constitutional: Negative.   Respiratory: Negative.   Cardiovascular: Negative.   Gastrointestinal: Negative.   Musculoskeletal: Positive for arthralgias and myalgias.       Right shoulder pain Right second metatarsal pain and swelling  Skin: Negative.   Allergic/Immunologic: Negative.   Neurological: Negative.   Psychiatric/Behavioral: Negative.    Past Medical History  Diagnosis Date  . Anemia     past H/o anemia  . Anxiety   . Diabetes mellitus without complication     borderline  . Fibroid 2004    abd hyst  . Thyroid disease     pasdt h/o hypothyroidism  . Urinary incontinence   . Chronic UTI (urinary tract infection)   . Infertility, female     History   Social History  . Marital Status: Married    Spouse Name: N/A    Number of Children: N/A  . Years of Education: N/A   Occupational History  . Not on file.   Social History Main Topics  . Smoking status: Never Smoker   . Smokeless tobacco: Not on file  . Alcohol Use: 1.0 oz/week    2 drink(s) per week     Comment: occ glass of wine  . Drug Use: No  . Sexual Activity: Yes    Partners: Male   Other Topics Concern  . Not on file   Social History  Narrative  . No narrative on file    Past Surgical History  Procedure Laterality Date  . Abdominal hysterectomy  2004    retained ovaries  . Pelvic laparoscopy  1999    Family History  Problem Relation Age of Onset  . Diabetes Mother   . Diabetes Father   . Stroke Father   . Anuerysm Father   . Sudden death Brother   . Heart attack Brother   . Diabetes Paternal Grandmother   . Breast cancer Maternal Grandmother     Allergies  Allergen Reactions  . Latex     Band aids and gloves  . Sulfamethoxazole     REACTION: unspecified    Current Outpatient Prescriptions on File Prior to Visit  Medication Sig Dispense Refill  . chlorpheniramine-HYDROcodone (TUSSIONEX PENNKINETIC ER) 10-8 MG/5ML LQCR Take 5 mLs by mouth every 12 (twelve) hours as needed.  140 mL  0  . diazepam (VALIUM) 5 MG tablet Take 1 tablet (5 mg total) by mouth every 12 (twelve) hours as needed for anxiety.  30 tablet  1  . estradiol (ESTRACE) 0.1 MG/GM vaginal cream Use 1/2 g vaginally every night for the first 2 weeks, then use 1/2 g vaginally two or three times per week as needed to maintain symptom  relief.  42.5 g  3  . Eszopiclone (ESZOPICLONE) 3 MG TABS Take 3 mg by mouth at bedtime. Take immediately before bedtime      . glucose blood (ONE TOUCH ULTRA TEST) test strip CHECK BLOOD GLUCOSE ONCE A DAY AS NEEDED  50 each  6  . ONETOUCH DELICA LANCETS MISC       . traZODone (DESYREL) 50 MG tablet Take 0.5-1 tablets (25-50 mg total) by mouth at bedtime as needed for sleep.  30 tablet  3  . venlafaxine XR (EFFEXOR XR) 37.5 MG 24 hr capsule Take 1 capsule (37.5 mg total) by mouth daily.  90 capsule  0  . zolpidem (AMBIEN) 10 MG tablet Take 1 tablet (10 mg total) by mouth at bedtime as needed for sleep.  30 tablet  1   No current facility-administered medications on file prior to visit.    BP 118/78  Pulse 88  Wt 160 lb (72.576 kg)  SpO2 99%  LMP 01/01/2004chart    Objective:   Physical Exam    Constitutional: She is oriented to person, place, and time. She appears well-developed and well-nourished.  Neck: Normal range of motion. Neck supple. Thyromegaly present.  Cardiovascular: Normal rate, regular rhythm and normal heart sounds.   Pulmonary/Chest: Effort normal and breath sounds normal.  Abdominal: Soft. Bowel sounds are normal.  Musculoskeletal: She exhibits tenderness.  Right shoulder: Tenderness to palpation at the posterior aspect of the shoulder. Pain with forced flexion and extension. Radial pulse 2/2   Rght metatarsal: Tenderness to palpation approximately. Minimal swelling. Range of motion without pain.    Neurological: She is alert and oriented to person, place, and time.  Skin: Skin is warm and dry.  Psychiatric: She has a normal mood and affect.          Assessment & Plan:  Donna HatchetSheila was seen today for toe injury and discuss depression meds.  Diagnoses and associated orders for this visit:  Right shoulder pain - DG Shoulder Right; Future  Injury of right toe    advised over-the-counter ibuprofen for aches and pain. Consider physical therapy pending the results of the right shoulder x-ray. Ice. Call the office if symptoms worsen or persist. Recheck as scheduled.

## 2013-04-19 NOTE — Progress Notes (Signed)
Pre visit review using our clinic review tool, if applicable. No additional management support is needed unless otherwise documented below in the visit note. 

## 2013-04-24 ENCOUNTER — Telehealth: Payer: Self-pay | Admitting: Family

## 2013-04-24 ENCOUNTER — Ambulatory Visit (INDEPENDENT_AMBULATORY_CARE_PROVIDER_SITE_OTHER)
Admission: RE | Admit: 2013-04-24 | Discharge: 2013-04-24 | Disposition: A | Discharge status: Home / Self Care | Payer: BC Managed Care – PPO | Source: Ambulatory Visit | Attending: Family | Admitting: Family

## 2013-04-24 DIAGNOSIS — M25519 Pain in unspecified shoulder: Secondary | ICD-10-CM

## 2013-04-24 DIAGNOSIS — M25511 Pain in right shoulder: Secondary | ICD-10-CM

## 2013-04-27 NOTE — Telephone Encounter (Addendum)
Pt stated pharm never received rx for effexor please resend

## 2013-04-27 NOTE — Telephone Encounter (Signed)
Pt aware Effexor and tramadol are ready for pick up at her pharmacy

## 2013-05-01 ENCOUNTER — Telehealth: Payer: Self-pay | Admitting: Family

## 2013-05-01 DIAGNOSIS — S99921A Unspecified injury of right foot, initial encounter: Secondary | ICD-10-CM

## 2013-05-01 NOTE — Telephone Encounter (Signed)
Pt wants to know if the results are in for her xray she had done on her shoulder a week ago

## 2013-05-01 NOTE — Telephone Encounter (Signed)
Pt aware of shoulder results but still c/o toe swelling and pain and she would like to have x-ray done.  X-ray ordered and pt aware

## 2013-05-03 ENCOUNTER — Encounter: Payer: BC Managed Care – PPO | Admitting: Family

## 2013-05-05 ENCOUNTER — Telehealth: Payer: Self-pay

## 2013-05-05 NOTE — Telephone Encounter (Signed)
Left message to advise pt of lab results received from [facility]. Copy mailed to pt and sent to scan

## 2013-05-08 ENCOUNTER — Ambulatory Visit (INDEPENDENT_AMBULATORY_CARE_PROVIDER_SITE_OTHER)
Admission: RE | Admit: 2013-05-08 | Discharge: 2013-05-08 | Disposition: A | Discharge status: Home / Self Care | Payer: BC Managed Care – PPO | Source: Ambulatory Visit | Attending: Family | Admitting: Family

## 2013-05-08 DIAGNOSIS — S99919A Unspecified injury of unspecified ankle, initial encounter: Secondary | ICD-10-CM

## 2013-05-08 DIAGNOSIS — S99929A Unspecified injury of unspecified foot, initial encounter: Secondary | ICD-10-CM

## 2013-05-08 DIAGNOSIS — S8990XA Unspecified injury of unspecified lower leg, initial encounter: Secondary | ICD-10-CM

## 2013-05-08 DIAGNOSIS — S99921A Unspecified injury of right foot, initial encounter: Secondary | ICD-10-CM

## 2013-05-09 ENCOUNTER — Encounter: Payer: Self-pay | Admitting: Family

## 2013-05-10 ENCOUNTER — Ambulatory Visit: Payer: BC Managed Care – PPO | Admitting: Obstetrics and Gynecology

## 2013-05-15 ENCOUNTER — Encounter: Payer: BC Managed Care – PPO | Admitting: Family

## 2013-05-15 ENCOUNTER — Ambulatory Visit: Payer: BC Managed Care – PPO | Admitting: Family

## 2013-05-15 DIAGNOSIS — Z0289 Encounter for other administrative examinations: Secondary | ICD-10-CM

## 2013-05-15 NOTE — Progress Notes (Signed)
   Subjective:    Patient ID: Donna KelpSheila D Cargle, female    DOB: 01/29/1965, 48 y.o.   MRN: [id number]  HPI    Review of Systems     Objective:   Physical Exam   No seen      Assessment & Plan:

## 2013-05-17 ENCOUNTER — Encounter: Payer: Self-pay | Admitting: Family Medicine

## 2013-05-17 ENCOUNTER — Ambulatory Visit (INDEPENDENT_AMBULATORY_CARE_PROVIDER_SITE_OTHER): Payer: BC Managed Care – PPO | Admitting: Family Medicine

## 2013-05-17 VITALS — BP 130/80 | HR 77 | Wt 159.0 lb

## 2013-05-17 DIAGNOSIS — S99919A Unspecified injury of unspecified ankle, initial encounter: Secondary | ICD-10-CM

## 2013-05-17 DIAGNOSIS — S99929A Unspecified injury of unspecified foot, initial encounter: Secondary | ICD-10-CM

## 2013-05-17 DIAGNOSIS — S8990XA Unspecified injury of unspecified lower leg, initial encounter: Secondary | ICD-10-CM

## 2013-05-17 NOTE — Progress Notes (Signed)
  Tawana ScaleZach Zaylah Blecha D.O. [facility] [address] [place], [place] [address] Phone: [phone] Subjective:    No see  Patient's once to go to physical therapy first before she is evaluated by me. Patient came to the office today and is canceling her appointment.

## 2013-05-23 ENCOUNTER — Ambulatory Visit: Payer: BC Managed Care – PPO | Attending: Family | Admitting: Physical Therapy

## 2013-05-23 DIAGNOSIS — IMO0001 Reserved for inherently not codable concepts without codable children: Secondary | ICD-10-CM | POA: Insufficient documentation

## 2013-05-23 DIAGNOSIS — M25619 Stiffness of unspecified shoulder, not elsewhere classified: Secondary | ICD-10-CM | POA: Insufficient documentation

## 2013-05-23 DIAGNOSIS — M25519 Pain in unspecified shoulder: Secondary | ICD-10-CM | POA: Insufficient documentation

## 2013-05-25 ENCOUNTER — Encounter: Payer: Self-pay | Admitting: Internal Medicine

## 2013-05-26 ENCOUNTER — Ambulatory Visit: Payer: BC Managed Care – PPO | Admitting: Physical Therapy

## 2013-05-30 ENCOUNTER — Ambulatory Visit: Payer: BC Managed Care – PPO | Admitting: Physical Therapy

## 2013-06-01 ENCOUNTER — Ambulatory Visit: Payer: BC Managed Care – PPO | Admitting: Physical Therapy

## 2013-06-01 ENCOUNTER — Encounter: Payer: BC Managed Care – PPO | Admitting: Physical Therapy

## 2013-06-08 ENCOUNTER — Ambulatory Visit: Payer: BC Managed Care – PPO | Admitting: Physical Therapy

## 2013-06-13 ENCOUNTER — Ambulatory Visit: Payer: BC Managed Care – PPO | Attending: Family | Admitting: Physical Therapy

## 2013-06-13 DIAGNOSIS — M25619 Stiffness of unspecified shoulder, not elsewhere classified: Secondary | ICD-10-CM | POA: Insufficient documentation

## 2013-06-13 DIAGNOSIS — M25519 Pain in unspecified shoulder: Secondary | ICD-10-CM | POA: Insufficient documentation

## 2013-06-13 DIAGNOSIS — IMO0001 Reserved for inherently not codable concepts without codable children: Secondary | ICD-10-CM | POA: Insufficient documentation

## 2013-06-16 ENCOUNTER — Ambulatory Visit (INDEPENDENT_AMBULATORY_CARE_PROVIDER_SITE_OTHER): Payer: BC Managed Care – PPO | Admitting: Family

## 2013-06-16 ENCOUNTER — Encounter: Payer: Self-pay | Admitting: Family

## 2013-06-16 VITALS — BP 118/78 | HR 71 | Temp 98.3°F | Ht 66.0 in | Wt 155.0 lb

## 2013-06-16 DIAGNOSIS — E7800 Pure hypercholesterolemia, unspecified: Secondary | ICD-10-CM

## 2013-06-16 DIAGNOSIS — E785 Hyperlipidemia, unspecified: Secondary | ICD-10-CM | POA: Insufficient documentation

## 2013-06-16 DIAGNOSIS — Z0000 Encounter for general adult medical examination without abnormal findings: Secondary | ICD-10-CM

## 2013-06-16 NOTE — Progress Notes (Signed)
Subjective:    Patient ID: Donna Blankenship, female    DOB: [birth year], 48 y.o.   MRN: [id number]  HPI  48 year old non-smoking African American female presents for review of labs from April and physical exam.  She denies acute injuries or pain today.   Labs were reviewed.  Ca++ 10.4  LDL 144, TRI 64, HDL 104, Total 261  She reports a significant CVD family history. Her brother died of MI in his 4640s, her father at 46 years old.   She reports 4-5 days of cardio/strength training exercise without symptoms. Her diet consist of heart healthy foods, gluten-free, mostly Mediterranean diet.   She plans to try red yeast rice and flax for cholesterol management and is uninterested in trying a statin.    Review of Systems  Constitutional: Negative.   HENT: Negative.   Eyes: Negative.   Respiratory: Negative.   Cardiovascular: Negative.   Gastrointestinal: Negative.   Endocrine: Negative.   Genitourinary: Negative.   Musculoskeletal: Negative.   Skin: Negative.   Allergic/Immunologic: Negative.   Neurological: Negative.   Hematological: Negative.   Psychiatric/Behavioral: Negative.    Past Medical History  Diagnosis Date  . Anemia     past H/o anemia  . Anxiety   . Diabetes mellitus without complication     borderline  . Fibroid 2004    abd hyst  . Thyroid disease     pasdt h/o hypothyroidism  . Urinary incontinence   . Chronic UTI (urinary tract infection)   . Infertility, female     History   Social History  . Marital Status: Married    Spouse Name: N/A    Number of Children: N/A  . Years of Education: N/A   Occupational History  . Not on file.   Social History Main Topics  . Smoking status: Never Smoker   . Smokeless tobacco: Not on file  . Alcohol Use: 1.0 oz/week    2 drink(s) per week     Comment: occ glass of wine  . Drug Use: No  . Sexual Activity: Yes    Partners: Male   Other Topics Concern  . Not on file   Social History Narrative  . No  narrative on file    Past Surgical History  Procedure Laterality Date  . Abdominal hysterectomy  2004    retained ovaries  . Pelvic laparoscopy  1999    Family History  Problem Relation Age of Onset  . Diabetes Mother   . Diabetes Father   . Stroke Father   . Anuerysm Father   . Sudden death Brother   . Heart attack Brother   . Diabetes Paternal Grandmother   . Breast cancer Maternal Grandmother     Allergies  Allergen Reactions  . Latex     Band aids and gloves  . Sulfamethoxazole     REACTION: unspecified    Current Outpatient Prescriptions on File Prior to Visit  Medication Sig Dispense Refill  . traZODone (DESYREL) 50 MG tablet Take 0.5-1 tablets (25-50 mg total) by mouth at bedtime as needed for sleep.  30 tablet  3   No current facility-administered medications on file prior to visit.    BP 118/78  Pulse 71  Temp(Src) 98.3 F (36.8 C) (Oral)  Ht 5\' 6"  (1.676 m)  Wt 155 lb (70.308 kg)  BMI 25.03 kg/m2  LMP 01/05/2002     Objective:   Physical Exam  Constitutional: She is oriented to person, place,  and time. She appears well-developed and well-nourished. No distress.  HENT:  Head: Normocephalic and atraumatic.  Eyes: Conjunctivae and EOM are normal. Pupils are equal, round, and reactive to light. Right eye exhibits no discharge. Left eye exhibits no discharge. No scleral icterus.  Neck: Normal range of motion. Neck supple. No thyromegaly present.  Cardiovascular: Normal rate, regular rhythm, normal heart sounds and intact distal pulses.  Exam reveals no gallop and no friction rub.   No murmur heard. Pulmonary/Chest: Effort normal and breath sounds normal.  Abdominal: Soft. Bowel sounds are normal. She exhibits no mass. There is no tenderness. There is no guarding.  Genitourinary:  Deferred, calling insurance company for provider list  Musculoskeletal: Normal range of motion. She exhibits no edema and no tenderness.  Lymphadenopathy:    She has no  cervical adenopathy.  Neurological: She is alert and oriented to person, place, and time. She has normal reflexes.  Skin: Skin is warm and dry. No rash noted. She is not diaphoretic. No erythema.  Psychiatric: She has a normal mood and affect. Her behavior is normal. Judgment and thought content normal.       Assessment & Plan:  Donna HatchetSheila was seen today for annual exam.  Diagnoses and associated orders for this visit:  Routine general medical examination at a health care facility   She is to return in 6 months for follow up and fasting labs.

## 2013-06-16 NOTE — Patient Instructions (Signed)

## 2013-06-20 ENCOUNTER — Ambulatory Visit: Payer: BC Managed Care – PPO | Admitting: Physical Therapy

## 2013-06-22 ENCOUNTER — Ambulatory Visit: Payer: BC Managed Care – PPO | Admitting: Physical Therapy

## 2013-06-27 ENCOUNTER — Ambulatory Visit: Payer: BC Managed Care – PPO | Admitting: Physical Therapy

## 2013-06-29 ENCOUNTER — Ambulatory Visit: Payer: BC Managed Care – PPO | Admitting: Physical Therapy

## 2013-07-14 ENCOUNTER — Ambulatory Visit: Payer: BC Managed Care – PPO | Attending: Family | Admitting: Physical Therapy

## 2013-07-14 DIAGNOSIS — M25619 Stiffness of unspecified shoulder, not elsewhere classified: Secondary | ICD-10-CM | POA: Insufficient documentation

## 2013-07-14 DIAGNOSIS — M25519 Pain in unspecified shoulder: Secondary | ICD-10-CM | POA: Diagnosis not present

## 2013-07-14 DIAGNOSIS — IMO0001 Reserved for inherently not codable concepts without codable children: Secondary | ICD-10-CM | POA: Diagnosis present

## 2013-07-17 DIAGNOSIS — L658 Other specified nonscarring hair loss: Secondary | ICD-10-CM | POA: Insufficient documentation

## 2013-08-17 ENCOUNTER — Other Ambulatory Visit: Payer: Self-pay | Admitting: Family

## 2013-10-31 ENCOUNTER — Other Ambulatory Visit: Payer: Self-pay | Admitting: Family

## 2014-01-01 ENCOUNTER — Encounter: Payer: Self-pay | Admitting: Family

## 2014-01-01 ENCOUNTER — Ambulatory Visit (INDEPENDENT_AMBULATORY_CARE_PROVIDER_SITE_OTHER): Payer: BC Managed Care – PPO | Admitting: Family

## 2014-01-01 VITALS — BP 120/80 | HR 79 | Temp 97.9°F | Wt 158.1 lb

## 2014-01-01 DIAGNOSIS — R739 Hyperglycemia, unspecified: Secondary | ICD-10-CM

## 2014-01-01 DIAGNOSIS — R7303 Prediabetes: Secondary | ICD-10-CM | POA: Insufficient documentation

## 2014-01-01 DIAGNOSIS — E7800 Pure hypercholesterolemia, unspecified: Secondary | ICD-10-CM

## 2014-01-01 NOTE — Progress Notes (Signed)
Subjective:    Patient ID: Donna Blankenship, female    DOB: 1965/04/17, 48 y.o.   MRN: [id number]  HPI 48 year old African-American female, nonsmoker with a history of anxiety, fibromyalgia, hypercalcemia, and hypercholesterolemia is in today for recheck. She will like to discontinue Effexor. Feels like the medication is not working. But also like to have labs drawn. Had [facility] at [facility] screen that showed a blood glucose of 163. She has a family history of type 2 diabetes.   Review of Systems  Constitutional: Negative.   HENT: Negative.   Respiratory: Negative.   Cardiovascular: Negative.   Gastrointestinal: Negative.   Endocrine: Negative.   Genitourinary: Negative.   Musculoskeletal: Negative.   Skin: Negative.   Allergic/Immunologic: Negative.   Neurological: Negative.   Hematological: Negative.   Psychiatric/Behavioral: Negative.        Wants to come off anxiety med   Past Medical History  Diagnosis Date  . Anemia     past H/o anemia  . Anxiety   . Diabetes mellitus without complication     borderline  . Fibroid 2004    abd hyst  . Thyroid disease     pasdt h/o hypothyroidism  . Urinary incontinence   . Chronic UTI (urinary tract infection)   . Infertility, female     History   Social History  . Marital Status: Married    Spouse Name: N/A    Number of Children: N/A  . Years of Education: N/A   Occupational History  . Not on file.   Social History Main Topics  . Smoking status: Never Smoker   . Smokeless tobacco: Not on file  . Alcohol Use: 1.0 oz/week    2 drink(s) per week     Comment: occ glass of wine  . Drug Use: No  . Sexual Activity:    Partners: Male   Other Topics Concern  . Not on file   Social History Narrative    Past Surgical History  Procedure Laterality Date  . Abdominal hysterectomy  2004    retained ovaries  . Pelvic laparoscopy  1999    Family History  Problem Relation Age of Onset  . Diabetes Mother   .  Diabetes Father   . Stroke Father   . Anuerysm Father   . Sudden death Brother   . Heart attack Brother   . Diabetes Paternal Grandmother   . Breast cancer Maternal Grandmother     Allergies  Allergen Reactions  . Latex     Band aids and gloves  . Sulfamethoxazole     REACTION: unspecified    Current Outpatient Prescriptions on File Prior to Visit  Medication Sig Dispense Refill  . diazepam (VALIUM) 5 MG tablet TAKE 1 TABLET EVERY 12 HOURS AS NEEDED FOR ANXIETY 30 tablet 0  . traZODone (DESYREL) 50 MG tablet TAKE 0.5-1 TABLETS (25-50 MG TOTAL) BY MOUTH AT BEDTIME AS NEEDED FOR SLEEP. 90 tablet 0   No current facility-administered medications on file prior to visit.    BP 120/80 mmHg  Pulse 79  Temp(Src) 97.9 F (36.6 C) (Oral)  Wt 158 lb 1.6 oz (71.714 kg)  LMP 01/01/2004chart    Objective:   Physical Exam  Constitutional: She is oriented to person, place, and time. She appears well-developed and well-nourished.  HENT:  Right Ear: External ear normal.  Left Ear: External ear normal.  Nose: Nose normal.  Mouth/Throat: Oropharynx is clear and moist.  Neck: Normal range of motion.  Neck supple. No thyromegaly present.  Cardiovascular: Normal rate, regular rhythm and normal heart sounds.   Pulmonary/Chest: Effort normal and breath sounds normal.  Abdominal: Soft. Bowel sounds are normal.  Musculoskeletal: Normal range of motion.  Neurological: She is alert and oriented to person, place, and time.  Skin: Skin is warm and dry.  Psychiatric: She has a normal mood and affect.          Assessment & Plan:  Velna HatchetSheila was seen today for follow-up.  Diagnoses and associated orders for this visit:  Hyperglycemia - Cancel: Hemoglobin A1c - Cancel: CMP - CMP - Hemoglobin A1c  Hypercalcemia - Cancel: Calcium - Cancel: CMP - CMP - Calcium  Pure hypercholesterolemia - Cancel: Lipid Panel - Cancel: CMP - CMP - Lipid Panel    Discontinue Effexor. Continue current  medications. Labs sent today will follow-up pending results.

## 2014-01-01 NOTE — Progress Notes (Signed)
Pre visit review using our clinic review tool, if applicable. No additional management support is needed unless otherwise documented below in the visit note. 

## 2014-01-01 NOTE — Patient Instructions (Signed)
Exercise to Stay Healthy Exercise helps you become and stay healthy. EXERCISE IDEAS AND TIPS Choose exercises that:  You enjoy.  Fit into your day. You do not need to exercise really hard to be healthy. You can do exercises at a slow or medium level and stay healthy. You can:  Stretch before and after working out.  Try yoga, Pilates, or tai chi.  Lift weights.  Walk fast, swim, jog, run, climb stairs, bicycle, dance, or rollerskate.  Take aerobic classes. Exercises that burn about 150 calories:  Running 1  miles in 15 minutes.  Playing volleyball for 45 to 60 minutes.  Washing and waxing a car for 45 to 60 minutes.  Playing touch football for 45 minutes.  Walking 1  miles in 35 minutes.  Pushing a stroller 1  miles in 30 minutes.  Playing basketball for 30 minutes.  Raking leaves for 30 minutes.  Bicycling 5 miles in 30 minutes.  Walking 2 miles in 30 minutes.  Dancing for 30 minutes.  Shoveling snow for 15 minutes.  Swimming laps for 20 minutes.  Walking up stairs for 15 minutes.  Bicycling 4 miles in 15 minutes.  Gardening for 30 to 45 minutes.  Jumping rope for 15 minutes.  Washing windows or floors for 45 to 60 minutes. Document Released: 01/24/2010 Document Revised: 03/16/2011 Document Reviewed: 01/24/2010 ExitCare Patient Information 2015 [employer]. This information is not intended to replace advice given to you by your health care provider. Make sure you discuss any questions you have with your health care provider.  

## 2014-01-02 LAB — HEMOGLOBIN A1C
Hgb A1c MFr Bld: 5.9 % — ABNORMAL HIGH (ref <5.7)
Mean Plasma Glucose: 123 mg/dL — ABNORMAL HIGH (ref <117)

## 2014-01-02 LAB — COMPREHENSIVE METABOLIC PANEL
ALK PHOS: 62 U/L (ref 39–117)
ALT: 10 U/L (ref 0–35)
AST: 18 U/L (ref 0–37)
Albumin: 4.4 g/dL (ref 3.5–5.2)
BUN: 13 mg/dL (ref 6–23)
CO2: 27 mEq/L (ref 19–32)
CREATININE: 0.96 mg/dL (ref 0.50–1.10)
Calcium: 9.9 mg/dL (ref 8.4–10.5)
Chloride: 105 mEq/L (ref 96–112)
GLUCOSE: 94 mg/dL (ref 70–99)
Potassium: 4.4 mEq/L (ref 3.5–5.3)
Sodium: 141 mEq/L (ref 135–145)
Total Bilirubin: 0.6 mg/dL (ref 0.2–1.2)
Total Protein: 6.9 g/dL (ref 6.0–8.3)

## 2014-01-02 LAB — LIPID PANEL
Cholesterol: 248 mg/dL — ABNORMAL HIGH (ref 0–200)
HDL: 78 mg/dL (ref >39)
LDL CALC: 160 mg/dL — AB (ref 0–99)
Total CHOL/HDL Ratio: 3.2 Ratio
Triglycerides: 52 mg/dL (ref <150)
VLDL: 10 mg/dL (ref 0–40)

## 2014-01-02 LAB — CALCIUM: Calcium: 9.9 mg/dL (ref 8.4–10.5)

## 2014-02-09 ENCOUNTER — Ambulatory Visit: Payer: Self-pay | Admitting: Family

## 2014-02-15 ENCOUNTER — Encounter: Payer: Self-pay | Admitting: Family

## 2014-02-15 ENCOUNTER — Telehealth: Payer: Self-pay

## 2014-02-15 ENCOUNTER — Encounter (INDEPENDENT_AMBULATORY_CARE_PROVIDER_SITE_OTHER): Payer: Self-pay

## 2014-02-15 NOTE — Telephone Encounter (Signed)
Called and left a message for pt to return call about making an appointment.

## 2014-02-15 NOTE — Telephone Encounter (Signed)
[name] - Client TELEPHONE ADVICE RECORD [facility] Patient Name: Donna HatchetSHEILA Minniear Gender: Female DOB: [birth year] Age: [age] Y 8 M 11 D Return Phone Number: [phone] (Primary) Address: [address] City[place]State/Zip: [place] [id number] Client [facility] - Client Client Site  [facility] Physician Adline Mangoampbell, Padonda Contact Type Call Caller Name Collier BullockSheila Colgate Caller Phone Number [phone] Relationship To Patient Self Is this call to report lab results? No Call Type General Information Initial Comment Caller states she is trying to get an appt. General Information Type Appointment Nurse Assessment Guidelines Guideline Title Affirmed Question Affirmed Notes Nurse Date/Time (Eastern Time) Disp. Time Lamount Cohen(Eastern Time) Disposition Final User 02/15/2014 11:10:42 AM General Information Provided Yes Kandace BlitzSpencer, Debra After Care Instructions Given Call Event Type User Date / Time Description

## 2014-02-20 ENCOUNTER — Other Ambulatory Visit: Payer: Self-pay | Admitting: Family

## 2014-03-15 ENCOUNTER — Other Ambulatory Visit: Payer: Self-pay | Admitting: Family

## 2014-03-22 ENCOUNTER — Encounter: Payer: Self-pay | Admitting: Family Medicine

## 2014-03-22 ENCOUNTER — Ambulatory Visit (INDEPENDENT_AMBULATORY_CARE_PROVIDER_SITE_OTHER): Payer: BLUE CROSS/BLUE SHIELD | Admitting: Family Medicine

## 2014-03-22 VITALS — BP 128/90 | HR 74 | Temp 98.0°F | Ht 66.0 in

## 2014-03-22 DIAGNOSIS — M25552 Pain in left hip: Secondary | ICD-10-CM

## 2014-03-22 DIAGNOSIS — R350 Frequency of micturition: Secondary | ICD-10-CM

## 2014-03-22 LAB — URINALYSIS, MICROSCOPIC ONLY: WBC UA: NONE SEEN (ref 0–?)

## 2014-03-22 LAB — POCT URINALYSIS DIPSTICK
BILIRUBIN UA: NEGATIVE
Glucose, UA: NEGATIVE
Ketones, UA: NEGATIVE
LEUKOCYTES UA: NEGATIVE
Nitrite, UA: NEGATIVE
PROTEIN UA: NEGATIVE
Spec Grav, UA: <=1.005
UROBILINOGEN UA: 0.2
pH, UA: 6.5

## 2014-03-22 NOTE — Progress Notes (Signed)
Pre visit review using our clinic review tool, if applicable. No additional management support is needed unless otherwise documented below in the visit note. 
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
## 2014-03-24 LAB — URINE CULTURE
Colony Count: NO GROWTH
Organism ID, Bacteria: NO GROWTH

## 2014-03-29 ENCOUNTER — Ambulatory Visit (INDEPENDENT_AMBULATORY_CARE_PROVIDER_SITE_OTHER): Payer: BLUE CROSS/BLUE SHIELD | Admitting: Family

## 2014-03-29 ENCOUNTER — Encounter: Payer: Self-pay | Admitting: Family

## 2014-03-29 VITALS — BP 120/82 | HR 71 | Temp 98.1°F | Ht 66.0 in | Wt 157.9 lb

## 2014-03-29 DIAGNOSIS — R0602 Shortness of breath: Secondary | ICD-10-CM | POA: Diagnosis not present

## 2014-03-29 DIAGNOSIS — J8410 Pulmonary fibrosis, unspecified: Secondary | ICD-10-CM | POA: Diagnosis not present

## 2014-03-29 DIAGNOSIS — I10 Essential (primary) hypertension: Secondary | ICD-10-CM | POA: Diagnosis not present

## 2014-03-29 DIAGNOSIS — E785 Hyperlipidemia, unspecified: Secondary | ICD-10-CM

## 2014-03-29 NOTE — Progress Notes (Signed)
Pre visit review using our clinic review tool, if applicable. No additional management support is needed unless otherwise documented below in the visit note. 

## 2014-03-29 NOTE — Patient Instructions (Signed)
Fat and Cholesterol Control Diet Fat and cholesterol levels in your blood and organs are influenced by your diet. High levels of fat and cholesterol may lead to diseases of the heart, small and large blood vessels, gallbladder, liver, and pancreas. CONTROLLING FAT AND CHOLESTEROL WITH DIET Although exercise and lifestyle factors are important, your diet is key. That is because certain foods are known to raise cholesterol and others to lower it. The goal is to balance foods for their effect on cholesterol and more importantly, to replace saturated and trans fat with other types of fat, such as monounsaturated fat, polyunsaturated fat, and omega-3 fatty acids. On average, a person should consume no more than 15 to 17 g of saturated fat daily. Saturated and trans fats are considered "bad" fats, and they will raise LDL cholesterol. Saturated fats are primarily found in animal products such as meats, butter, and cream. However, that does not mean you need to give up all your favorite foods. Today, there are good tasting, low-fat, low-cholesterol substitutes for most of the things you like to eat. Choose low-fat or nonfat alternatives. Choose round or loin cuts of red meat. These types of cuts are lowest in fat and cholesterol. Chicken (without the skin), fish, veal, and ground turkey breast are great choices. Eliminate fatty meats, such as hot dogs and salami. Even shellfish have little or no saturated fat. Have a 3 oz (85 g) portion when you eat lean meat, poultry, or fish. Trans fats are also called "partially hydrogenated oils." They are oils that have been scientifically manipulated so that they are solid at room temperature resulting in a longer shelf life and improved taste and texture of foods in which they are added. Trans fats are found in stick margarine, some tub margarines, cookies, crackers, and baked goods.  When baking and cooking, oils are a great substitute for butter. The monounsaturated oils are  especially beneficial since it is believed they lower LDL and raise HDL. The oils you should avoid entirely are saturated tropical oils, such as coconut and palm.  Remember to eat a lot from food groups that are naturally free of saturated and trans fat, including fish, fruit, vegetables, beans, grains (barley, rice, couscous, bulgur Riggsbee), and pasta (without cream sauces).  IDENTIFYING FOODS THAT LOWER FAT AND CHOLESTEROL  Soluble fiber may lower your cholesterol. This type of fiber is found in fruits such as apples, vegetables such as broccoli, potatoes, and carrots, legumes such as beans, peas, and lentils, and grains such as barley. Foods fortified with plant sterols (phytosterol) may also lower cholesterol. You should eat at least 2 g per day of these foods for a cholesterol lowering effect.  Read package labels to identify low-saturated fats, trans fat free, and low-fat foods at the supermarket. Select cheeses that have only 2 to 3 g saturated fat per ounce. Use a heart-healthy tub margarine that is free of trans fats or partially hydrogenated oil. When buying baked goods (cookies, crackers), avoid partially hydrogenated oils. Breads and muffins should be made from whole grains (whole-Wolfson or whole oat flour, instead of "flour" or "enriched flour"). Buy non-creamy canned soups with reduced salt and no added fats.  FOOD PREPARATION TECHNIQUES  Never deep-fry. If you must fry, either stir-fry, which uses very little fat, or use non-stick cooking sprays. When possible, broil, bake, or roast meats, and steam vegetables. Instead of putting butter or margarine on vegetables, use lemon and herbs, applesauce, and cinnamon (for squash and sweet potatoes). Use nonfat   yogurt, salsa, and low-fat dressings for salads.  LOW-SATURATED FAT / LOW-FAT FOOD SUBSTITUTES Meats / Saturated Fat (g)  Avoid: Steak, marbled (3 oz/85 g) / 11 g  Choose: Steak, lean (3 oz/85 g) / 4 g  Avoid: Hamburger (3 oz/85 g) / 7  g  Choose: Hamburger, lean (3 oz/85 g) / 5 g  Avoid: Ham (3 oz/85 g) / 6 g  Choose: Ham, lean cut (3 oz/85 g) / 2.4 g  Avoid: Chicken, with skin, dark meat (3 oz/85 g) / 4 g  Choose: Chicken, skin removed, dark meat (3 oz/85 g) / 2 g  Avoid: Chicken, with skin, light meat (3 oz/85 g) / 2.5 g  Choose: Chicken, skin removed, light meat (3 oz/85 g) / 1 g Dairy / Saturated Fat (g)  Avoid: Whole milk (1 cup) / 5 g  Choose: Low-fat milk, 2% (1 cup) / 3 g  Choose: Low-fat milk, 1% (1 cup) / 1.5 g  Choose: Skim milk (1 cup) / 0.3 g  Avoid: Hard cheese (1 oz/28 g) / 6 g  Choose: Skim milk cheese (1 oz/28 g) / 2 to 3 g  Avoid: Cottage cheese, 4% fat (1 cup) / 6.5 g  Choose: Low-fat cottage cheese, 1% fat (1 cup) / 1.5 g  Avoid: Ice cream (1 cup) / 9 g  Choose: Sherbet (1 cup) / 2.5 g  Choose: Nonfat frozen yogurt (1 cup) / 0.3 g  Choose: Frozen fruit bar / trace  Avoid: Whipped cream (1 tbs) / 3.5 g  Choose: Nondairy whipped topping (1 tbs) / 1 g Condiments / Saturated Fat (g)  Avoid: Mayonnaise (1 tbs) / 2 g  Choose: Low-fat mayonnaise (1 tbs) / 1 g  Avoid: Butter (1 tbs) / 7 g  Choose: Extra light margarine (1 tbs) / 1 g  Avoid: Coconut oil (1 tbs) / 11.8 g  Choose: Olive oil (1 tbs) / 1.8 g  Choose: Corn oil (1 tbs) / 1.7 g  Choose: Safflower oil (1 tbs) / 1.2 g  Choose: Sunflower oil (1 tbs) / 1.4 g  Choose: Soybean oil (1 tbs) / 2.4 g  Choose: Canola oil (1 tbs) / 1 g Document Released: 12/22/2004 Document Revised: 04/18/2012 Document Reviewed: 03/22/2013 ExitCare Patient Information 2015 [employer]. This information is not intended to replace advice given to you by your health care provider. Make sure you discuss any questions you have with your health care provider.  

## 2014-03-29 NOTE — Progress Notes (Signed)
Subjective:    Patient ID: Donna Blankenship, female    DOB: [birth year], 49 y.o.   MRN: [id number]  HPI 49 year old African-American female, nonsmoker with a history of pulmonary fibrosis, GERD, hypothyroidism, anxiety, hypercholesterolemia is in today for recheck. Reports having more shortness of breath with or without exertion lasting anywhere from 12:55 hour. She has discontinued taking Effexor. She will like a referral back to pulmonology and cardiology for evaluation. Reports having a stress test done a couple years ago that was abnormal but then returned to normal before this test was over. She is unsure of what that was.    Review of Systems  Constitutional: Negative.   HENT: Negative.   Respiratory: Positive for shortness of breath. Negative for cough and wheezing.   Cardiovascular: Negative.  Negative for chest pain.  Gastrointestinal: Negative.   Endocrine: Negative.   Genitourinary: Negative.   Musculoskeletal: Negative.   Skin: Negative.   Allergic/Immunologic: Negative.   Neurological: Negative.   Hematological: Negative.   Psychiatric/Behavioral: Negative.    Past Medical History  Diagnosis Date  . Anemia     past H/o anemia  . Anxiety   . Diabetes mellitus without complication     borderline  . Fibroid 2004    abd hyst  . Thyroid disease     pasdt h/o hypothyroidism  . Urinary incontinence   . Chronic UTI (urinary tract infection)   . Infertility, female     History   Social History  . Marital Status: Married    Spouse Name: N/A  . Number of Children: N/A  . Years of Education: N/A   Occupational History  . Not on file.   Social History Main Topics  . Smoking status: Never Smoker   . Smokeless tobacco: Not on file  . Alcohol Use: 1.0 oz/week    2 drink(s) per week     Comment: occ glass of wine  . Drug Use: No  . Sexual Activity:    Partners: Male   Other Topics Concern  . Not on file   Social History Narrative    Past Surgical History    Procedure Laterality Date  . Abdominal hysterectomy  2004    retained ovaries  . Pelvic laparoscopy  1999    Family History  Problem Relation Age of Onset  . Diabetes Mother   . Diabetes Father   . Stroke Father   . Anuerysm Father   . Sudden death Brother   . Heart attack Brother   . Diabetes Paternal Grandmother   . Breast cancer Maternal Grandmother     Allergies  Allergen Reactions  . Latex     Band aids and gloves  . Sulfamethoxazole     REACTION: unspecified    No current outpatient prescriptions on file prior to visit.   No current facility-administered medications on file prior to visit.    BP 120/82 mmHg  Pulse 71  Temp(Src) 98.1 F (36.7 C) (Oral)  Ht  (1.676 m)  Wt 157 lb 14.4 oz (71.623 kg)  BMI 25.50 kg/m2  LMP 01/01/2004chart     Objective:   Physical Exam  Constitutional: She is oriented to person, place, and time. She appears well-developed and well-nourished.  HENT:  Right Ear: External ear normal.  Left Ear: External ear normal.  Nose: Nose normal.  Mouth/Throat: Oropharynx is clear and moist.  Neck: Normal range of motion. Neck supple. No thyromegaly present.  Cardiovascular: Normal rate, regular rhythm and normal heart sounds.  Pulmonary/Chest: Effort normal and breath sounds normal.  Abdominal: Soft. Bowel sounds are normal.  Musculoskeletal: Normal range of motion.  Neurological: She is alert and oriented to person, place, and time.  Skin: Skin is warm and dry.  Psychiatric: She has a normal mood and affect.          Assessment & Plan:  Donna Blankenship was seen today for follow-up.  Diagnoses and all orders for this visit:  Essential hypertension  Shortness of breath Orders: -     Ambulatory referral to Cardiology  Postinflammatory pulmonary fibrosis Orders: -     Ambulatory referral to Pulmonology  Hyperlipidemia Orders: -     Lipid Panel; Future   Referrals done consider taking SSRI. To the emergency department  if she develops chest pain with shortness of breath. Return for follow-up fasting cholesterol in one month. She has declined statins in the past. Would like to explore natural measures.

## 2014-04-02 ENCOUNTER — Telehealth: Payer: Self-pay | Admitting: Family

## 2014-04-02 NOTE — Telephone Encounter (Signed)
emmi mailed  °

## 2014-04-20 ENCOUNTER — Ambulatory Visit (INDEPENDENT_AMBULATORY_CARE_PROVIDER_SITE_OTHER): Payer: BLUE CROSS/BLUE SHIELD | Admitting: Internal Medicine

## 2014-04-20 ENCOUNTER — Encounter: Payer: Self-pay | Admitting: Internal Medicine

## 2014-04-20 VITALS — BP 120/80 | HR 75 | Ht 66.0 in | Wt 155.0 lb

## 2014-04-20 DIAGNOSIS — R0600 Dyspnea, unspecified: Secondary | ICD-10-CM | POA: Insufficient documentation

## 2014-04-20 NOTE — Progress Notes (Signed)
Subjective:     Patient ID: Donna Blankenship, female   DOB: [birth year]   MRN: [id number]  HPI  [age]  yobf  never smoked dx with SLE mid 20s when presented with recurrent "bronchitis" =  aching shoulders dry cough/ sense of chest "congestion", fatigue all better on prednisone and dx as lupus and all resolved while still in [place] in her 10320s and felt fine with w/u by Dr Delford FieldWright last seen in 2009 c/w pf but no f/u after 2009 and bothered by intermittent sob ever since so referred 04/20/2014 to pulmonary clinic by Adline MangoPadonda Campbell.     04/20/2014 1st [facility] office visit/ Xian Alves   Chief Complaint  Patient presents with  . Advice Only    Referred by Dr. Orvan Falconerampbell.  SOB.    variable x > decade  sob x  2-10 per month / lasts sev min to sev hours, deep breathing seems to help/ inhalers don't/ no assoc choking or loss of voice  Never onset or disturbed when sleeping, never with exercise / worse x last 3 weeks.  Doesn't feel anything like when she had sle.   No obvious other patterns in day to day or daytime variabilty or assoc chronic cough or cp or chest tightness, subjective wheeze overt sinus or hb symptoms. No unusual exp hx or h/o childhood pna/ asthma or knowledge of premature birth.  Sleeping ok without nocturnal  or early am exacerbation  of respiratory  c/o's or need for noct saba. Also denies any obvious fluctuation of symptoms with weather or environmental changes or other aggravating or alleviating factors except as outlined above   Current Medications, Allergies, Complete Past Medical History, Past Surgical History, Family History, and Social History were reviewed in [facility] electronic medical record.           Review of Systems     Objective:   Physical Exam    pleasant amb bf nad   Wt Readings from Last 3 Encounters:  04/20/14 155 lb (70.308 kg)  03/29/14 157 lb 14.4 oz (71.623 kg)  01/01/14 158 lb 1.6 oz (71.714 kg)    Vital signs reviewed   HEENT: nl  dentition, turbinates, and orophanx. Nl external ear canals without cough reflex   NECK :  without JVD/Nodes/TM/ nl carotid upstrokes bilaterally   LUNGS: no acc muscle use, clear to A and P bilaterally with trace crackles both bases/ no cough    CV:  RRR  no s3 or murmur or increase in P2, no edema   ABD:  soft and nontender with nl excursion in the supine position. No bruits or organomegaly, bowel sounds nl  MS:  warm without deformities, calf tenderness, cyanosis or clubbing  SKIN: warm and dry without lesions    NEURO:  alert, approp, no deficits      CXR PA and Lateral:   04/20/2014 :    Declined   I personally reviewed images and agree with radiology impression as follows:  CXR:  01/08/08  Mild interstitial opacity in the upper and lower lobes, potentially a manifestation of the patient's lupus. This appears similar to that noted on recent chest CT examinations.      Assessment:

## 2014-04-20 NOTE — Patient Instructions (Addendum)
To get the most out of exercise, you need to be continuously aware that you are short of breath, but never out of breath, for 30 minutes daily. As you improve, it will actually be easier for you to do the same amount of exercise  in  30 minutes so always push to the level where you are short of breath.      GERD (REFLUX)  is an extremely common cause of respiratory symptoms just like yours , many times with no obvious heartburn at all.    It can be treated with medication, but also with lifestyle changes including avoidance of late meals, excessive alcohol, smoking cessation, and avoid fatty foods, chocolate, peppermint, colas, red wine, and acidic juices such as orange juice.  NO MINT OR MENTHOL PRODUCTS SO NO COUGH DROPS  USE SUGARLESS CANDY INSTEAD (Jolley ranchers or Stover's or Life Savers) or even ice chips will also do - the key is to swallow to prevent all throat clearing. NO OIL BASED VITAMINS - use powdered substitutes.    Please schedule a follow up office visit in 6 weeks, call sooner if needed with pft's on return  Late add  Inquire re any awareness of  loss of voice ever with spells ? Needs trial of ppi?

## 2014-04-21 ENCOUNTER — Encounter: Payer: Self-pay | Admitting: Internal Medicine

## 2014-04-21 NOTE — Assessment & Plan Note (Signed)
-   PFTs  01/28/2006  VC 3.5 (94%) no obst/ dlco 63 corrects to 84% for alv vol - 04/21/2014  Walked RA x 3 laps @ 185 ft each stopped due to  End of study, moderate pace, sat 92% at end, no sob   I had an extended discussion with the patient and her mother reviewing all relevant studies completed to date and  lasting 35 minutes   1) although she reports only "bronchitis" in her 4520s at dx of sle, I strongly suspect this was active lupus pneumonitis called bronchitis by a health care professional before the dx was clear and labeled by the patient thereafter - but the point is she doesn't sense the "bronchitis" now and I doubt she has active lupus pneumonitis  2) she likely does have permanent mild pf assoc with previous lupus pneumonitis, but this wouldn't explain her variable sob at rest now that can't be reproduced with ex or bother her sleep.  This sounds much more typical of hyperventilation syndrome/panic though GERD/ LPR laryngospasm is in the ddx (absence of hoarseness assoc rules against but asked her to look for this and gave her gerd diet)  3) she needs cxr > declined  4) she needs repeat pfts > agreed   5) in meantime would benefit from consistent low lever aerobic ex  See instructions for specific recommendations which were reviewed directly with the patient who was given a copy with highlighter outlining the key components.

## 2014-05-11 ENCOUNTER — Ambulatory Visit: Payer: BLUE CROSS/BLUE SHIELD | Admitting: Cardiovascular Disease

## 2014-05-17 ENCOUNTER — Ambulatory Visit: Payer: BLUE CROSS/BLUE SHIELD | Admitting: Internal Medicine

## 2014-05-21 ENCOUNTER — Encounter: Payer: Self-pay | Admitting: Cardiovascular Disease

## 2014-05-24 ENCOUNTER — Other Ambulatory Visit: Payer: BLUE CROSS/BLUE SHIELD

## 2014-06-22 ENCOUNTER — Other Ambulatory Visit (INDEPENDENT_AMBULATORY_CARE_PROVIDER_SITE_OTHER): Payer: BLUE CROSS/BLUE SHIELD

## 2014-06-22 DIAGNOSIS — E785 Hyperlipidemia, unspecified: Secondary | ICD-10-CM

## 2014-06-22 LAB — LIPID PANEL
CHOL/HDL RATIO: 3
Cholesterol: 229 mg/dL — ABNORMAL HIGH (ref 0–200)
HDL: 87.4 mg/dL (ref >39.00)
LDL Cholesterol: 133 mg/dL — ABNORMAL HIGH (ref 0–99)
NonHDL: 141.6
TRIGLYCERIDES: 44 mg/dL (ref 0.0–149.0)
VLDL: 8.8 mg/dL (ref 0.0–40.0)

## 2014-06-26 ENCOUNTER — Encounter: Payer: Self-pay | Admitting: Family Medicine

## 2014-06-26 ENCOUNTER — Ambulatory Visit (INDEPENDENT_AMBULATORY_CARE_PROVIDER_SITE_OTHER): Payer: BLUE CROSS/BLUE SHIELD | Admitting: Family Medicine

## 2014-06-26 DIAGNOSIS — R69 Illness, unspecified: Secondary | ICD-10-CM

## 2014-06-26 NOTE — Progress Notes (Signed)
NO SHOW FOR NPV. On review of chart prior to visit:  HM: -refused flu and tetanus vaccines in the past -HIV  Hx of:  GAD: -meds: Effexor XR 37.5 -reports: -denies:  Dyspnea:  -see Dr. Sherene Sires in pulmonology for this -hx remote lupus pneumonitis, mild pf -doing trial PPI for possible GERD related symptoms

## 2014-07-10 ENCOUNTER — Ambulatory Visit: Payer: BLUE CROSS/BLUE SHIELD | Admitting: Cardiovascular Disease

## 2014-08-03 ENCOUNTER — Ambulatory Visit (INDEPENDENT_AMBULATORY_CARE_PROVIDER_SITE_OTHER): Payer: BLUE CROSS/BLUE SHIELD | Admitting: Family

## 2014-08-03 ENCOUNTER — Encounter: Payer: Self-pay | Admitting: Family

## 2014-08-03 VITALS — BP 120/70 | HR 81 | Temp 98.3°F

## 2014-08-03 DIAGNOSIS — F411 Generalized anxiety disorder: Secondary | ICD-10-CM | POA: Diagnosis not present

## 2014-08-03 DIAGNOSIS — L200 Besnier's prurigo: Secondary | ICD-10-CM

## 2014-08-03 DIAGNOSIS — L239 Allergic contact dermatitis, unspecified cause: Secondary | ICD-10-CM

## 2014-08-03 DIAGNOSIS — B354 Tinea corporis: Secondary | ICD-10-CM

## 2014-08-03 MED ORDER — DIAZEPAM 5 MG PO TABS: 5.0000 mg | ORAL_TABLET | Freq: Two times a day (BID) | ORAL | Status: DC | PRN

## 2014-08-03 MED ORDER — PREDNISONE 20 MG PO TABS: 20.0000 mg | ORAL_TABLET | Freq: Every day | ORAL | Status: DC

## 2014-08-03 NOTE — Patient Instructions (Signed)

## 2014-08-03 NOTE — Progress Notes (Signed)
Subjective:    Patient ID: Donna Blankenship, female    DOB: May 24, 1965, 49 y.o.   MRN: [id number]  HPI Comments: 49 year old [place] American female here today with complaints of fungus rash on face and around eyes.  States that rash began when her niece left from a previous visit with her a few days ago.  States that she has the rash looks like ringworm, and it is located on her stomach, face and scalp.  States that it is not painful, but very itchy.  States that she did go to Urgent Care and received a Rx for Ketoconazole and a fungal cream to use topically, which she hasn't began to use the cream yet.  States that Rx provides minimal relief and she has tried using a garlic mixture also to help prevent rash from spreading.    Patient also has complaint of left ear swelling.  States that it began as irritating, then swelling, and this was before the rash began.  States that it is not itching, just very swollen.  She hasn't used anything on ear for relief.  Patient states that she does not have any pain and had not experienced shortness of breath.       Review of Systems  Constitutional: Negative.   HENT: Negative.        Complaints of left ear swelling and facial fungus  Eyes: Negative.   Respiratory: Negative.   Cardiovascular: Negative.   Endocrine: Negative.   Skin: Positive for rash.       Complaints of rash on abdomen, face and scalp.  Allergic/Immunologic: Negative.   Neurological: Negative.   Hematological: Negative.   Psychiatric/Behavioral: Negative for behavioral problems, confusion and decreased concentration. The patient is nervous/anxious.    Past Medical History  Diagnosis Date  . Anemia     past H/o anemia  . Anxiety   . Diabetes mellitus without complication     borderline  . Fibroid 2004    abd hyst  . Thyroid disease     pasdt h/o hypothyroidism  . Urinary incontinence   . Chronic UTI (urinary tract infection)   . Infertility, female   . Dyspnea     seeing  pulm, hx lupus pneumonitis, mild pulm fibrosis    History   Social History  . Marital Status: Married    Spouse Name: N/A  . Number of Children: N/A  . Years of Education: N/A   Occupational History  . Not on file.   Social History Main Topics  . Smoking status: Never Smoker   . Smokeless tobacco: Not on file  . Alcohol Use: 1.0 oz/week    2 drink(s) per week     Comment: occ glass of wine  . Drug Use: No  . Sexual Activity:    Partners: Male   Other Topics Concern  . Not on file   Social History Narrative    Past Surgical History  Procedure Laterality Date  . Abdominal hysterectomy  2004    retained ovaries  . Pelvic laparoscopy  1999    Family History  Problem Relation Age of Onset  . Diabetes Mother   . Diabetes Father   . Stroke Father   . Anuerysm Father   . Sudden death Brother   . Heart attack Brother   . Diabetes Paternal Grandmother   . Breast cancer Maternal Grandmother     Allergies  Allergen Reactions  . Latex     Band aids and gloves  .  Sulfamethoxazole     REACTION: unspecified    Current Outpatient Prescriptions on File Prior to Visit  Medication Sig Dispense Refill  . venlafaxine XR (EFFEXOR-XR) 37.5 MG 24 hr capsule Take by mouth daily.      No current facility-administered medications on file prior to visit.    BP 120/70 mmHg  Pulse 81  Temp(Src) 98.3 F (36.8 C) (Oral)  Wt   SpO2 97%  LMP 01/01/2004chart     Objective:   Physical Exam  Constitutional: She is oriented to person, place, and time. She appears well-developed and well-nourished.  HENT:  Left ear swollen  Neck: Normal range of motion. Neck supple.  Cardiovascular: Normal rate, regular rhythm and normal heart sounds.   Pulmonary/Chest: Effort normal and breath sounds normal.  Neurological: She is alert and oriented to person, place, and time.  Skin: Rash noted.  Circular,red risen rash on face lateral to left eye, left lowerabdomen and mid-scalp.           Assessment & Plan:  Diagnoses and all orders for this visit:  Tinea corporis  Allergic dermatitis  Generalized anxiety disorder  Other orders -     diazepam (VALIUM) 5 MG tablet; Take 1 tablet (5 mg total) by mouth every 12 (twelve) hours as needed for anxiety. -     predniSONE (DELTASONE) 20 MG tablet; Take 1 tablet (20 mg total) by mouth daily with breakfast.   Patient to continue Ketoconazole tablets as directed  Patient to begin using anti-fungal cream as prescribed.  Rx for Prednisone 20mg  daily X 5 days, for left ear swelling.

## 2014-08-03 NOTE — Progress Notes (Signed)
Pre visit review using our clinic review tool, if applicable. No additional management support is needed unless otherwise documented below in the visit note. 

## 2014-08-09 ENCOUNTER — Telehealth: Payer: Self-pay | Admitting: Family

## 2014-08-09 NOTE — Telephone Encounter (Signed)
PLEASE NOTE: All timestamps contained within this report are represented as [place] Standard Time. CONFIDENTIALTY NOTICE: This fax transmission is intended only for the addressee. It contains information that is legally privileged, confidential or otherwise protected from use or disclosure. If you are not the intended recipient, you are strictly prohibited from reviewing, disclosing, copying using or disseminating any of this information or taking any action in reliance on or regarding this information. If you have received this fax in error, please notify us immediately by telephone so that we can arrange for its return to [place]. Phone: [phone], Toll-Free: [phone], Fax: [phone] Page: 1 of 1 Call Id: [id number] [facility] - Client TELEPHONE ADVICE RECORD [facility] Patient Name: Donna Blankenship DOB: 22-Apr-1965 Initial Comment Caller states, has ring worm, she was given an oral Rx which has a warning label, for monitoring kidney and liver function while she is taking it --- no symptoms now Nurse Assessment Nurse: Roderic Ovens, RN, Amy Date/Time (Eastern Time): 08/09/2014 1:28:54 PM Confirm and document reason for call. If symptomatic, describe symptoms. ---Caller states that she is on a medication for ringworm and that the warning on it is to check with md prior to taking this medication regarding kidney and liver functions. She states that this was not done and she would like to have this medication changed to something not so damaging to her organs. She is taking this medication for ringworm that is on her face, her back and on her hand. The medication is Ketocolnazole (Nozoral). Has the patient traveled out of the country within the last 30 days? ---Not Applicable Does the patient require triage? ---Yes Related visit to physician within the last 2 weeks? ---No Does the PT have any chronic conditions? (i.e. diabetes, asthma,  etc.) ---Unknown Did the patient indicate they were pregnant? ---No Guidelines Guideline Title Affirmed Question Affirmed Notes No Guideline or Reference Available Nursing judgment Final Disposition User See Physician within 24 Hours [place], [place], Amy Comments caller was instructed to have an appt within 24 hours. she was unable to have an appt today. she requested for one to be on monday. appt was made on monday and it was okay as it is regarding a medication and she states that she was suppose to get in with dr. Selena Batten as dr. Orvan Falconer is leaving and that is who is she is suppose to get established with. Set up appt with drl kim on Monday as requested. Referrals REFERRED TO PCP OFFICE Disagree/Comply: Comply

## 2014-08-13 ENCOUNTER — Encounter: Payer: Self-pay | Admitting: Family Medicine

## 2014-08-13 ENCOUNTER — Ambulatory Visit (INDEPENDENT_AMBULATORY_CARE_PROVIDER_SITE_OTHER): Payer: BLUE CROSS/BLUE SHIELD | Admitting: Family Medicine

## 2014-08-13 VITALS — BP 128/80 | HR 77 | Temp 98.1°F | Ht 66.0 in

## 2014-08-13 DIAGNOSIS — R739 Hyperglycemia, unspecified: Secondary | ICD-10-CM | POA: Diagnosis not present

## 2014-08-13 DIAGNOSIS — F411 Generalized anxiety disorder: Secondary | ICD-10-CM

## 2014-08-13 DIAGNOSIS — E785 Hyperlipidemia, unspecified: Secondary | ICD-10-CM

## 2014-08-13 DIAGNOSIS — L989 Disorder of the skin and subcutaneous tissue, unspecified: Secondary | ICD-10-CM

## 2014-08-13 DIAGNOSIS — F39 Unspecified mood [affective] disorder: Secondary | ICD-10-CM | POA: Diagnosis not present

## 2014-08-13 NOTE — Progress Notes (Signed)
Pre visit review using our clinic review tool, if applicable. No additional management support is needed unless otherwise documented below in the visit note. Patient declines weight measurement today. 

## 2014-08-13 NOTE — Patient Instructions (Signed)
BEFORE YOU LEAVE: -schedule physical in 3-6 months; drink plenty of water but come fasting  Call your dermatologist for an appointment to evaluate the skin lesions  We recommend the following healthy lifestyle measures: - eat a healthy diet consisting of lots of vegetables, fruits, beans, nuts, seeds, healthy meats such as white chicken and fish and whole grains.  - avoid fried foods, fast food, processed foods, sodas, red meet and other fattening foods.  - get a least 150 minutes of aerobic exercise per week.

## 2014-08-13 NOTE — Progress Notes (Signed)
HPI:  Genell Thede Janoski is here to establish care. She used to see [facility] [facility].   Has the following chronic problems that require follow up and concerns today:  Ringworm: -started about a few weeks ago -skin lesions on face, scalp, abd - itchy -saw ucc and given ketoconazole tables 200mg  daily for 2 weeks -using topical clotrimazole for several weeks as well -sees dermatologist at [facility] and in [place] for traction alopecia  S/p abd hyst for fibroid 2004  Hx of:  HLD/Prediabetes: -no regular exercise, diet is ok -taking red yeast rice -prefers natural remidies -no polyuria or polydipsia  -she has chosen not to eat gluten, eats organic, lots of veggies  GAD/neurodermatitis: -chronic, when father passed 8 years and worsened with brother passing at 9 yo with MI -meds: Effexor XR 37.5 - weaning off of this slowly, uses valium occasionally for fear of panic attack - uses 1/2 tablet every other month -reports:doing much better now -denies: SI, hx of depression  Dyspnea:  -see Dr. Sherene Sires in pulmonology for this -hx ? remote lupus pneumonitis, with residual mild pulm fibrosis per notes  -she denies GERd, reports had this in the past -she has been doing better recently with her brathing  ROS negative for unless reported above: fevers, unintentional weight loss, hearing or vision loss, chest pain, palpitations, struggling to breath, hemoptysis, melena, hematochezia, hematuria, falls, loc, si, thoughts of self harm  Past Medical History  Diagnosis Date  . Anemia     past H/o anemia  . Anxiety     on effexor and valium in the past  . Hyperglycemia     borderline  . Thyroid disease     past h/o hypothyroidism  . Dyspnea     seeing pulm, hx lupus pneumonitis, mild pulm fibrosis  . GERD 01/11/2008    Qualifier: Diagnosis of  By: Yetta Barre MD, Bernadene Bell.   . Fibromyalgia 01/18/2008    Qualifier: Diagnosis of  By: Yetta Barre MD, Bernadene Bell.   . Infertility, female     Past Surgical History   Procedure Laterality Date  . Abdominal hysterectomy  2004    for fibroid; retained ovaries  . Pelvic laparoscopy  1999    Family History  Problem Relation Age of Onset  . Diabetes Mother   . Diabetes Father   . Stroke Father   . Anuerysm Father   . Sudden death Brother   . Heart attack Brother   . Diabetes Paternal Grandmother   . Breast cancer Maternal Grandmother     History   Social History  . Marital Status: Married    Spouse Name: N/A  . Number of Children: N/A  . Years of Education: N/A   Social History Main Topics  . Smoking status: Never Smoker   . Smokeless tobacco: Not on file  . Alcohol Use: 1.0 oz/week    2 drink(s) per week     Comment: occ glass of wine  . Drug Use: No  . Sexual Activity:    Partners: Male   Other Topics Concern  . None   Social History Narrative   Work or School: not working      Home Situation: lives with husband      Spiritual Beliefs: Christian      Lifestyle: eats healthy, not exercising           Current outpatient prescriptions:  .  clotrimazole (LOTRIMIN) 1 % cream, , Disp: , Rfl:  .  diazepam (VALIUM) 5 MG  tablet, Take 1 tablet (5 mg total) by mouth every 12 (twelve) hours as needed for anxiety., Disp: 30 tablet, Rfl: 0 .  ketoconazole (NIZORAL) 200 MG tablet, , Disp: , Rfl:  .  venlafaxine XR (EFFEXOR-XR) 37.5 MG 24 hr capsule, Take by mouth daily. , Disp: , Rfl:   EXAM:  Filed Vitals:   08/13/14 1628  BP: 128/80  Pulse: 77  Temp: 98.1 F (36.7 C)    There is no weight on file to calculate BMI.  GENERAL: vitals reviewed and listed above, alert, oriented, appears well hydrated and in no acute distress  HEENT: atraumatic, conjunttiva clear, no obvious abnormalities on inspection of external nose and ears  NECK: no obvious masses on inspection  LUNGS: clear to auscultation bilaterally, no wheezes, rales or rhonchi, good air movement  CV: HRRR, no peripheral edema  SKIN: irr shaped hyperpig patch of  skin 1.5x1 cm in diameter with several small papules within lesion, raised hyperpigmented scarred lesion on top of head approx 2 cm in diameter, another such lesion is located on the abd - this lesion with some peripheral scaling  MS: moves all extremities without noticeable abnormality  PSYCH: pleasant and cooperative, no obvious depression or anxiety  ASSESSMENT AND PLAN:  Discussed the following assessment and plan:  Skin lesions -wide differential, given reports nor improvement with fungal tx feel given her hx eval with derm appropriate, she has agreed to call derm for eval.   Hyperlipemia -add regular exercise, check lipids at physical  Hyperglycemia -monitor at physicals  Mood disorder -weaning off of effexor, doing well -rare prn use of benzo ok, she reports 1.2 tab valium ever 1-2 months  -We reviewed the PMH, PSH, FH, SH, Meds and Allergies. -We provided refills for any medications we will prescribe as needed. -We addressed current concerns per orders and patient instructions. -We have asked for records for pertinent exams, studies, vaccines and notes from previous providers. -We have advised patient to follow up per instructions below.   -Patient advised to return or notify a doctor immediately if symptoms worsen or persist or new concerns arise.  Patient Instructions  BEFORE YOU LEAVE: -schedule physical in 3-6 months; drink plenty of water but come fasting  Call your dermatologist for an appointment to evaluate the skin lesions  We recommend the following healthy lifestyle measures: - eat a healthy diet consisting of lots of vegetables, fruits, beans, nuts, seeds, healthy meats such as white chicken and fish and whole grains.  - avoid fried foods, fast food, processed foods, sodas, red meet and other fattening foods.  - get a least 150 minutes of aerobic exercise per week.        Kriste Basque R.

## 2014-09-04 ENCOUNTER — Encounter: Payer: BLUE CROSS/BLUE SHIELD | Admitting: Family Medicine

## 2014-09-04 DIAGNOSIS — Z0289 Encounter for other administrative examinations: Secondary | ICD-10-CM

## 2014-09-04 NOTE — Progress Notes (Signed)
Error   This encounter was created in error - please disregard. 

## 2014-09-24 ENCOUNTER — Ambulatory Visit (INDEPENDENT_AMBULATORY_CARE_PROVIDER_SITE_OTHER): Payer: BLUE CROSS/BLUE SHIELD | Admitting: Adult Health

## 2014-09-24 ENCOUNTER — Ambulatory Visit (INDEPENDENT_AMBULATORY_CARE_PROVIDER_SITE_OTHER): Payer: Self-pay | Admitting: Family Medicine

## 2014-09-24 DIAGNOSIS — N3000 Acute cystitis without hematuria: Secondary | ICD-10-CM | POA: Diagnosis not present

## 2014-09-24 DIAGNOSIS — R69 Illness, unspecified: Secondary | ICD-10-CM

## 2014-09-24 LAB — POCT UA - GLUCOSE/PROTEIN: PROTEIN UA: NEGATIVE

## 2014-09-24 MED ORDER — CIPROFLOXACIN HCL 250 MG PO TABS: 250.0000 mg | ORAL_TABLET | Freq: Two times a day (BID) | ORAL | Status: DC

## 2014-09-24 NOTE — Progress Notes (Signed)
Pre visit review using our clinic review tool, if applicable. No additional management support is needed unless otherwise documented below in the visit note. 

## 2014-09-24 NOTE — Progress Notes (Signed)
NO SHOW

## 2014-09-24 NOTE — Patient Instructions (Signed)
It was great meeting you today!  I have sent in a prescription for Cipro, take this twice a day for three days.   If your symptoms get worse, please let me know.

## 2014-09-24 NOTE — Progress Notes (Addendum)
   Subjective:    Patient ID: Donna Blankenship, female    DOB: Jun 03, 1965, 49 y.o.   MRN: [id number]  HPI  49 year old female who presents to the office today for UTI type symptoms for less than 24 hours. Her symptoms include frequency, dysuria, lower back pain, hesitancy, feeling of incomplete bladder emptying. She endorses a significant history of UTI's. She is presenting with her typical symptoms.   No fevers, abdominal pain , n/v/d   Review of Systems  Constitutional: Negative.   Gastrointestinal: Negative.   Genitourinary: Positive for dysuria, urgency, frequency, flank pain, decreased urine volume and difficulty urinating. Negative for hematuria, vaginal bleeding, vaginal discharge, vaginal pain and pelvic pain.  Musculoskeletal: Negative.        Objective:   Physical Exam  Constitutional: She is oriented to person, place, and time.  Cardiovascular: Normal rate, regular rhythm and normal heart sounds.  Exam reveals no gallop and no friction rub.   No murmur heard. Pulmonary/Chest: Effort normal and breath sounds normal. No respiratory distress. She has no wheezes. She has no rales. She exhibits no tenderness.  Abdominal: Soft. Bowel sounds are normal. She exhibits no distension and no mass. There is no tenderness. There is no rebound and no guarding.  Musculoskeletal: Normal range of motion. She exhibits no edema or tenderness.  No CVA tenderness  Neurological: She is alert and oriented to person, place, and time.  Skin: Skin is warm and dry.  Psychiatric: She has a normal mood and affect. Her behavior is normal. Judgment and thought content normal.  Nursing note and vitals reviewed.      Assessment & Plan:  1. Acute cystitis without hematuria - POC Urinalysis - Glucose/Protein - ciprofloxacin (CIPRO) 250 MG tablet; Take 1 tablet (250 mg total) by mouth 2 (two) times daily.  Dispense: 6 tablet; Refill: 0 - POC Urinalysis Dipstick- Negative

## 2014-09-25 LAB — POCT URINALYSIS DIPSTICK
BILIRUBIN UA: NEGATIVE
GLUCOSE UA: NEGATIVE
KETONES UA: NEGATIVE
Leukocytes, UA: NEGATIVE
Nitrite, UA: NEGATIVE
Protein, UA: NEGATIVE
RBC UA: NEGATIVE
SPEC GRAV UA: 1.015
Urobilinogen, UA: NEGATIVE
pH, UA: 6

## 2014-09-25 NOTE — Addendum Note (Signed)
Addended by: Nancy Fetter on: 09/25/2014 01:30 PM   Modules accepted: SmartSet

## 2014-09-25 NOTE — Addendum Note (Signed)
Addended by: Cheree Ditto, DEMETRICE A on: 09/25/2014 12:57 PM   Modules accepted: Orders

## 2014-10-08 ENCOUNTER — Ambulatory Visit (INDEPENDENT_AMBULATORY_CARE_PROVIDER_SITE_OTHER): Payer: BLUE CROSS/BLUE SHIELD | Admitting: *Deleted

## 2014-10-08 DIAGNOSIS — Z23 Encounter for immunization: Secondary | ICD-10-CM

## 2014-10-08 DIAGNOSIS — Z111 Encounter for screening for respiratory tuberculosis: Secondary | ICD-10-CM

## 2014-10-10 ENCOUNTER — Encounter: Payer: Self-pay | Admitting: *Deleted

## 2014-10-10 LAB — TB SKIN TEST
INDURATION: 0 mm
TB SKIN TEST: NEGATIVE

## 2014-10-15 ENCOUNTER — Telehealth: Payer: Self-pay | Admitting: Family Medicine

## 2014-10-15 NOTE — Telephone Encounter (Signed)
Is requesting to transfer from Donna Blankenship to Donna Blankenship. Please advise.

## 2014-10-15 NOTE — Telephone Encounter (Signed)
Left message to call back to schedule.

## 2014-10-15 NOTE — Telephone Encounter (Signed)
Ok with me 

## 2014-10-20 ENCOUNTER — Ambulatory Visit: Payer: BLUE CROSS/BLUE SHIELD | Admitting: Family Medicine

## 2014-10-29 ENCOUNTER — Ambulatory Visit (INDEPENDENT_AMBULATORY_CARE_PROVIDER_SITE_OTHER): Payer: BLUE CROSS/BLUE SHIELD | Admitting: Family Medicine

## 2014-10-29 ENCOUNTER — Encounter: Payer: Self-pay | Admitting: Internal Medicine

## 2014-10-29 ENCOUNTER — Ambulatory Visit (INDEPENDENT_AMBULATORY_CARE_PROVIDER_SITE_OTHER): Payer: BLUE CROSS/BLUE SHIELD | Admitting: Internal Medicine

## 2014-10-29 VITALS — BP 146/90 | HR 69 | Temp 98.3°F | Resp 16 | Ht 66.0 in | Wt 153.0 lb

## 2014-10-29 DIAGNOSIS — E7800 Pure hypercholesterolemia, unspecified: Secondary | ICD-10-CM | POA: Diagnosis not present

## 2014-10-29 DIAGNOSIS — R922 Inconclusive mammogram: Secondary | ICD-10-CM

## 2014-10-29 DIAGNOSIS — F411 Generalized anxiety disorder: Secondary | ICD-10-CM

## 2014-10-29 DIAGNOSIS — R9230 Dense breasts, unspecified: Secondary | ICD-10-CM

## 2014-10-29 DIAGNOSIS — R739 Hyperglycemia, unspecified: Secondary | ICD-10-CM

## 2014-10-29 DIAGNOSIS — R69 Illness, unspecified: Secondary | ICD-10-CM

## 2014-10-29 NOTE — Assessment & Plan Note (Signed)
Managed naturally Controlled, stable

## 2014-10-29 NOTE — Progress Notes (Signed)
Subjective:    Patient ID: Donna Blankenship, female    DOB: November 06, 1965, 49 y.o.   MRN: [id number]  HPI She is here to establish with a new pcp.  She is concerned about her blood work checked.      Blood pressure is elevated her today. She states she monitors at home and it is typically 110 on top.  Neck pain:  She has a history of neck pain from muscle tension and was prescribed valium in the past.  She only takes this on a rare occasion.  She would like to avoid any refills - she will other natural things.   Hyperlipidemia: she has a history of elevated chollesterol. She has taken red yeast rice in the past and changed her diet. This has brought her cholesterol down.She prefers do make lifestyle changes and avoid all prescription medications.  Hx of hypothyroidism:  She has a history hypothyroidism. She was on armour thyroid in the past and did well with medication. When this medication was available she was on Synthroid and not do well. She is currently not on any medication. She is concerned about her thyroid function.  Prediabetes:   She is slightly elevated sugar. She tries to eat a low carbohydrate diet and exercises on a regular basis. She has family history of diabetes.    Medications and allergies reviewed with patient and updated if appropriate.  Patient Active Problem List   Diagnosis Date Noted  . GAD (generalized anxiety disorder) 08/13/2014  . Dyspnea 04/20/2014  . Hyperglycemia 01/01/2014  . Pure hypercholesterolemia 06/16/2013  . GERD 01/11/2008  . PULMONARY FIBROSIS, POSTINFLAMMATORY 09/20/2007  . COLONIC POLYPS, HX OF 10/21/2006    Past Medical History  Diagnosis Date  . Anemia     past H/o anemia  . Anxiety     on effexor and valium in the past  . Hyperglycemia     borderline  . Thyroid disease     past h/o hypothyroidism  . Dyspnea     seeing pulm, hx lupus pneumonitis, mild pulm fibrosis  . GERD 01/11/2008    Qualifier: Diagnosis of  By: Yetta BarreJones MD,  Bernadene Bellhomas L.   . Fibromyalgia 01/18/2008    Qualifier: Diagnosis of  By: Yetta BarreJones MD, Bernadene Bellhomas L.   . Infertility, female     Past Surgical History  Procedure Laterality Date  . Abdominal hysterectomy  2004    for fibroid; retained ovaries  . Pelvic laparoscopy  1999    Social History   Social History  . Marital Status: Married    Spouse Name: N/A  . Number of Children: N/A  . Years of Education: N/A   Social History Main Topics  . Smoking status: Never Smoker   . Smokeless tobacco: None  . Alcohol Use: 1.0 oz/week    2 Standard drinks or equivalent per week     Comment: occ glass of wine  . Drug Use: No  . Sexual Activity:    Partners: Male   Other Topics Concern  . None   Social History Narrative   Work or School: not working      Home Situation: lives with husband      Spiritual Beliefs: Christian      Lifestyle: eats healthy, not exercising          Review of Systems  Constitutional: Negative for fever, chills, appetite change, fatigue and unexpected weight change.  HENT: Negative for congestion, ear pain and sore throat.   Respiratory:  Negative for cough, shortness of breath (occasional, pulmonary fibrosis) and wheezing.   Cardiovascular: Negative for chest pain, palpitations and leg swelling.  Gastrointestinal: Negative for abdominal pain, diarrhea and constipation.       No GERD  Musculoskeletal: Negative for back pain and arthralgias.  Neurological: Positive for headaches.  Psychiatric/Behavioral: Negative for dysphoric mood. The patient is nervous/anxious.        Objective:   Filed Vitals:   10/29/14 1304  BP: 146/90  Pulse: 69  Temp: 98.3 F (36.8 C)  Resp: 16   Filed Weights   10/29/14 1304  Weight: 153 lb (69.4 kg)   Body mass index is 24.71 kg/(m^2).   Physical Exam  Constitutional: She is oriented to person, place, and time. She appears well-developed and well-nourished.  HENT:  Head: Normocephalic and atraumatic.  Right Ear:  External ear normal.  Left Ear: External ear normal.  Mouth/Throat: Oropharynx is clear and moist.  Eyes: Conjunctivae are normal.  Neck: Neck supple. No tracheal deviation present. No thyromegaly present.  No carotid  Cardiovascular: Normal rate, regular rhythm and normal heart sounds.   No murmur heard. Pulmonary/Chest: Effort normal and breath sounds normal. No respiratory distress. She has no wheezes.  Abdominal: Bowel sounds are normal. She exhibits no distension. There is no tenderness.  Musculoskeletal: She exhibits no edema.  Lymphadenopathy:    She has no cervical adenopathy.  Neurological: She is oriented to person, place, and time.  Skin: Skin is warm and dry.  Psychiatric: She has a normal mood and affect. Her behavior is normal.          Assessment & Plan:   See Problem List.

## 2014-10-29 NOTE — Progress Notes (Signed)
NO SHOW

## 2014-10-29 NOTE — Assessment & Plan Note (Signed)
a1c at one point was 5.9% She is exercising She is complaint with a low sugar/carb diet Will check a1c

## 2014-10-29 NOTE — Assessment & Plan Note (Signed)
Likely familial Regular exercise Low fat/cholesterol diet Will check lipid profile - she would like a detailed lipid profile Consider restarting red yeast rice

## 2014-10-29 NOTE — Progress Notes (Signed)
Pre visit review using our clinic review tool, if applicable. No additional management support is needed unless otherwise documented below in the visit note. 

## 2014-10-29 NOTE — Patient Instructions (Signed)
  Test(s) ordered today. Your results will be released to MyChart (or called to you) after review, usually within 72hours after test completion. If any changes need to be made, you will be notified at that same time.  I ordered ultrasound for your breasts for  imaging - mention that this was ordered when you make your mammogram appointment.  All other Health Maintenance issues reviewed.   All recommended immunizations and age-appropriate screenings are up-to-date.  No immunizations administered today.   Medications reviewed and updated.  No changes recommended at this time.

## 2014-10-30 ENCOUNTER — Other Ambulatory Visit (INDEPENDENT_AMBULATORY_CARE_PROVIDER_SITE_OTHER): Payer: BLUE CROSS/BLUE SHIELD

## 2014-10-30 ENCOUNTER — Encounter: Payer: Self-pay | Admitting: Internal Medicine

## 2014-10-30 DIAGNOSIS — E7800 Pure hypercholesterolemia, unspecified: Secondary | ICD-10-CM

## 2014-10-30 DIAGNOSIS — R739 Hyperglycemia, unspecified: Secondary | ICD-10-CM

## 2014-10-30 LAB — COMPREHENSIVE METABOLIC PANEL
ALBUMIN: 4.5 g/dL (ref 3.5–5.2)
ALK PHOS: 66 U/L (ref 39–117)
ALT: 9 U/L (ref 0–35)
AST: 18 U/L (ref 0–37)
BUN: 14 mg/dL (ref 6–23)
CO2: 30 mEq/L (ref 19–32)
Calcium: 10.5 mg/dL (ref 8.4–10.5)
Chloride: 103 mEq/L (ref 96–112)
Creatinine, Ser: 1.03 mg/dL (ref 0.40–1.20)
GFR: 73.74 mL/min (ref >60.00)
Glucose, Bld: 95 mg/dL (ref 70–99)
POTASSIUM: 4.3 meq/L (ref 3.5–5.1)
Sodium: 140 mEq/L (ref 135–145)
Total Bilirubin: 0.7 mg/dL (ref 0.2–1.2)
Total Protein: 7.7 g/dL (ref 6.0–8.3)

## 2014-10-30 LAB — CBC WITH DIFFERENTIAL/PLATELET
Basophils Absolute: 0 10*3/uL (ref 0.0–0.1)
Basophils Relative: 0.6 % (ref 0.0–3.0)
Eosinophils Absolute: 0.4 10*3/uL (ref 0.0–0.7)
Eosinophils Relative: 5.6 % — ABNORMAL HIGH (ref 0.0–5.0)
HCT: 39.8 % (ref 36.0–46.0)
Hemoglobin: 13.3 g/dL (ref 12.0–15.0)
Lymphocytes Relative: 25.3 % (ref 12.0–46.0)
Lymphs Abs: 2 10*3/uL (ref 0.7–4.0)
MCHC: 33.4 g/dL (ref 30.0–36.0)
MCV: 87.4 fl (ref 78.0–100.0)
MONO ABS: 0.7 10*3/uL (ref 0.1–1.0)
Monocytes Relative: 8.7 % (ref 3.0–12.0)
Neutro Abs: 4.6 10*3/uL (ref 1.4–7.7)
Neutrophils Relative %: 59.8 % (ref 43.0–77.0)
Platelets: 191 10*3/uL (ref 150.0–400.0)
RBC: 4.55 Mil/uL (ref 3.87–5.11)
RDW: 13.4 % (ref 11.5–15.5)
WBC: 7.7 10*3/uL (ref 4.0–10.5)

## 2014-10-30 LAB — LIPID PANEL
CHOL/HDL RATIO: 3
Cholesterol: 246 mg/dL — ABNORMAL HIGH (ref 0–200)
HDL: 82.3 mg/dL (ref >39.00)
LDL Cholesterol: 153 mg/dL — ABNORMAL HIGH (ref 0–99)
NonHDL: 164.08
TRIGLYCERIDES: 55 mg/dL (ref 0.0–149.0)
VLDL: 11 mg/dL (ref 0.0–40.0)

## 2014-10-30 LAB — HEMOGLOBIN A1C: HEMOGLOBIN A1C: 5.5 % (ref 4.6–6.5)

## 2014-10-30 LAB — TSH: TSH: 1.44 u[IU]/mL (ref 0.35–4.50)

## 2014-11-02 ENCOUNTER — Other Ambulatory Visit: Payer: Self-pay

## 2014-11-02 ENCOUNTER — Other Ambulatory Visit: Payer: Self-pay | Admitting: Internal Medicine

## 2014-11-02 DIAGNOSIS — R922 Inconclusive mammogram: Secondary | ICD-10-CM

## 2014-11-02 DIAGNOSIS — R9230 Dense breasts, unspecified: Secondary | ICD-10-CM

## 2014-11-13 ENCOUNTER — Ambulatory Visit (INDEPENDENT_AMBULATORY_CARE_PROVIDER_SITE_OTHER): Payer: BLUE CROSS/BLUE SHIELD | Admitting: Internal Medicine

## 2014-11-13 ENCOUNTER — Encounter: Payer: Self-pay | Admitting: Internal Medicine

## 2014-11-13 VITALS — BP 144/94 | HR 77 | Temp 97.8°F | Resp 16 | Wt 150.0 lb

## 2014-11-13 DIAGNOSIS — L439 Lichen planus, unspecified: Secondary | ICD-10-CM | POA: Insufficient documentation

## 2014-11-13 DIAGNOSIS — L661 Lichen planopilaris: Secondary | ICD-10-CM

## 2014-11-13 MED ORDER — CLOBETASOL PROPIONATE 0.05 % EX CREA: 1.0000 "application " | TOPICAL_CREAM | Freq: Two times a day (BID) | CUTANEOUS | Status: DC

## 2014-11-13 NOTE — Progress Notes (Signed)
Pre visit review using our clinic review tool, if applicable. No additional management support is needed unless otherwise documented below in the visit note. 

## 2014-11-13 NOTE — Progress Notes (Signed)
Subjective:    Patient ID: Donna Blankenship, female    DOB: 10/21/1965, 49 y.o.   MRN: [id number]  HPI She saw derm and had a biopsy for a cyst on her head.  She was diagnosed with lichen planopilaris.  She has a follow up with derm, but not until next week.  She has burning type pain at the cyst location, which is on the top of her head.  She is not sure what to do - she needs relief until next week.  She has tried polysporin, which has helped slightly.  There is no discharge from the cyst.   She denies fever.  She is having headaches, but unsure if that is related.  She is taking advil for the headaches - this stated prior to the biopsy.   She has a remote history of being positive for lupus and followed with rheumatology for a period of time.  She has been symptom free for years.  Medications and allergies reviewed with patient and updated if appropriate.  Patient Active Problem List   Diagnosis Date Noted  . GAD (generalized anxiety disorder) 08/13/2014  . Dyspnea 04/20/2014  . Hyperglycemia 01/01/2014  . Pure hypercholesterolemia 06/16/2013  . PULMONARY FIBROSIS, POSTINFLAMMATORY 09/20/2007  . COLONIC POLYPS, HX OF 10/21/2006    Past Medical History  Diagnosis Date  . Anemia     past H/o anemia  . Anxiety     on effexor and valium in the past  . Hyperglycemia     borderline  . Thyroid disease     past h/o hypothyroidism  . Dyspnea     seeing pulm, hx lupus pneumonitis, mild pulm fibrosis  . GERD 01/11/2008    Qualifier: Diagnosis of  By: Yetta Barre MD, Bernadene Bell.   . Fibromyalgia 01/18/2008    Qualifier: Diagnosis of  By: Yetta Barre MD, Bernadene Bell.   . Infertility, female     Past Surgical History  Procedure Laterality Date  . Abdominal hysterectomy  2004    for fibroid; retained ovaries  . Pelvic laparoscopy  1999    Social History   Social History  . Marital Status: Married    Spouse Name: N/A  . Number of Children: N/A  . Years of Education: N/A   Social History Main  Topics  . Smoking status: Never Smoker   . Smokeless tobacco: None  . Alcohol Use: 1.0 oz/week    2 Standard drinks or equivalent per week     Comment: occ glass of wine  . Drug Use: No  . Sexual Activity:    Partners: Male   Other Topics Concern  . None   Social History Narrative   Work or School: not working      Home Situation: lives with husband      Spiritual Beliefs: Christian      Lifestyle: eats healthy, not exercising          Review of Systems  Constitutional: Negative for fever.  Skin:       No discharged from cyst, ? Redness at cyst,   Neurological: Positive for headaches.       Objective:   Filed Vitals:   11/13/14 0825  BP: 144/94  Pulse: 77  Temp: 97.8 F (36.6 C)  Resp: 16   Filed Weights   11/13/14 0825  Weight: 150 lb (68.04 kg)   Body mass index is 24.22 kg/(m^2).   Physical Exam  Constitutional: She appears well-developed and well-nourished.  Skin:  Small cyst on top of head that is non-erythematous, non-tender, no open wound, no dicharge        Assessment & Plan:   Lichen Planopilaris Having burning type pain and she "just needs some relief" Stop polysporin - no evidence of an infection Start clobetasol topical cream twice daily until she sees derm next week Would like to avoid oral steroids -discussed that with her -- dermatology will be treating her since they are more qualified - may get a steroid injection next week  Call if no improvement after a couple of days.

## 2014-11-13 NOTE — Patient Instructions (Addendum)
A topical steroid was sent to your pharmacy.  Use as directed and let me know if there is no improvement.  Follow up with dermatology next week.

## 2014-11-24 DIAGNOSIS — L930 Discoid lupus erythematosus: Secondary | ICD-10-CM | POA: Insufficient documentation

## 2014-11-28 ENCOUNTER — Other Ambulatory Visit: Payer: Self-pay | Admitting: Internal Medicine

## 2014-11-28 ENCOUNTER — Encounter: Payer: Self-pay | Admitting: Internal Medicine

## 2014-11-28 ENCOUNTER — Encounter: Payer: BLUE CROSS/BLUE SHIELD | Admitting: Internal Medicine

## 2014-11-28 DIAGNOSIS — Z0289 Encounter for other administrative examinations: Secondary | ICD-10-CM

## 2014-11-28 DIAGNOSIS — R922 Inconclusive mammogram: Secondary | ICD-10-CM

## 2014-11-28 NOTE — Progress Notes (Signed)
This encounter was created in error - please disregard.

## 2014-12-19 ENCOUNTER — Other Ambulatory Visit (INDEPENDENT_AMBULATORY_CARE_PROVIDER_SITE_OTHER): Payer: BLUE CROSS/BLUE SHIELD

## 2014-12-19 ENCOUNTER — Encounter: Payer: Self-pay | Admitting: Internal Medicine

## 2014-12-19 ENCOUNTER — Ambulatory Visit (INDEPENDENT_AMBULATORY_CARE_PROVIDER_SITE_OTHER): Payer: BLUE CROSS/BLUE SHIELD | Admitting: Internal Medicine

## 2014-12-19 DIAGNOSIS — L661 Lichen planopilaris, unspecified: Secondary | ICD-10-CM

## 2014-12-19 DIAGNOSIS — E041 Nontoxic single thyroid nodule: Secondary | ICD-10-CM

## 2014-12-19 DIAGNOSIS — Z91018 Allergy to other foods: Secondary | ICD-10-CM

## 2014-12-19 DIAGNOSIS — L309 Dermatitis, unspecified: Secondary | ICD-10-CM

## 2014-12-19 DIAGNOSIS — L930 Discoid lupus erythematosus: Secondary | ICD-10-CM | POA: Diagnosis not present

## 2014-12-19 LAB — COMPREHENSIVE METABOLIC PANEL
ALBUMIN: 4.5 g/dL (ref 3.5–5.2)
ALT: 10 U/L (ref 0–35)
AST: 18 U/L (ref 0–37)
Alkaline Phosphatase: 73 U/L (ref 39–117)
BUN: 9 mg/dL (ref 6–23)
CALCIUM: 10 mg/dL (ref 8.4–10.5)
CHLORIDE: 101 meq/L (ref 96–112)
CO2: 30 meq/L (ref 19–32)
Creatinine, Ser: 1.16 mg/dL (ref 0.40–1.20)
GFR: 64.25 mL/min (ref >60.00)
Glucose, Bld: 101 mg/dL — ABNORMAL HIGH (ref 70–99)
POTASSIUM: 4.1 meq/L (ref 3.5–5.1)
SODIUM: 138 meq/L (ref 135–145)
Total Bilirubin: 0.5 mg/dL (ref 0.2–1.2)
Total Protein: 7.8 g/dL (ref 6.0–8.3)

## 2014-12-19 MED ORDER — EPINEPHRINE 0.3 MG/0.3ML IJ SOAJ
0.3000 mg | Freq: Once | INTRAMUSCULAR | Status: DC
Start: 2014-12-19 — End: 2017-05-27

## 2014-12-19 NOTE — Patient Instructions (Signed)
  We have reviewed your prior records including labs and tests today.  Test(s) ordered today. Your results will be released to MyChart (or called to you) after review, usually within 72hours after test completion. If any changes need to be made, you will be notified at that same time.  A referral was ordered for rheumatology.    Your prescription(s) have been submitted to your pharmacy. Please take as directed and contact our office if you believe you are having problem(s) with the medication(s).

## 2014-12-19 NOTE — Assessment & Plan Note (Signed)
Interested in food allergy testing to make sure she is not allergic to anything else Food allergy test ordered

## 2014-12-19 NOTE — Assessment & Plan Note (Signed)
Recommended US, but she declined today - will consider in the future

## 2014-12-19 NOTE — Progress Notes (Signed)
Subjective:    Patient ID: Donna Blankenship, female    DOB: 06-Jul-1965, 49 y.o.   MRN: [id number]  HPI  She had blood work done recently by rheumatology and her ANA was elevated, speckled pattern with a titer of 1:40.  She also had a scalp biopsy and was diagnosed with lichen planopilaris.  She was advised to go on plaquenil, but she does not want to and has not started it.  She would like to get a second opinion.   With recent blood work she was also found to have elevated calcium, 10.3(upper limit of normal 10.2).  She states she has had elevated calcium in the past and is concerned about her parathyroid.  She denies every having that checked in the past.  She has had a thyroid US in the past and was told she had nodules.  She is unsure if it needed to be followed.    She has some food allergies that she knows of, but is afraid she may have others.  She can not tolerate dairy and knows she is allergic to:  sesame seed, walnuts, shell fish, pecans, peanuts, animals, dust.  She has eczema and wonders if it is related to something she is eating or a possible leaky gut syndrome    Medications and allergies reviewed with patient and updated if appropriate.  Patient Active Problem List   Diagnosis Date Noted  . Lichen planopilaris 11/13/2014  . GAD (generalized anxiety disorder) 08/13/2014  . Dyspnea 04/20/2014  . Hyperglycemia 01/01/2014  . Pure hypercholesterolemia 06/16/2013  . PULMONARY FIBROSIS, POSTINFLAMMATORY 09/20/2007  . COLONIC POLYPS, HX OF 10/21/2006    Current Outpatient Prescriptions on File Prior to Visit  Medication Sig Dispense Refill  . clobetasol cream (TEMOVATE) 0.05 % Apply 1 application topically 2 (two) times daily. 30 g 0  . diazepam (VALIUM) 5 MG tablet Take 1 tablet (5 mg total) by mouth every 12 (twelve) hours as needed for anxiety. 30 tablet 0  . hydroxychloroquine (PLAQUENIL) 200 MG tablet      No current facility-administered medications on file prior to  visit.    Past Medical History  Diagnosis Date  . Anemia     past H/o anemia  . Anxiety     on effexor and valium in the past  . Hyperglycemia     borderline  . Thyroid disease     past h/o hypothyroidism  . Dyspnea     seeing pulm, hx lupus pneumonitis, mild pulm fibrosis  . GERD 01/11/2008    Qualifier: Diagnosis of  By: Yetta Barre MD, Bernadene Bell.   . Fibromyalgia 01/18/2008    Qualifier: Diagnosis of  By: Yetta Barre MD, Bernadene Bell.   . Infertility, female     Past Surgical History  Procedure Laterality Date  . Abdominal hysterectomy  2004    for fibroid; retained ovaries  . Pelvic laparoscopy  1999    Social History   Social History  . Marital Status: Married    Spouse Name: N/A  . Number of Children: N/A  . Years of Education: N/A   Social History Main Topics  . Smoking status: Never Smoker   . Smokeless tobacco: Not on file  . Alcohol Use: 1.0 oz/week    2 Standard drinks or equivalent per week     Comment: occ glass of wine  . Drug Use: No  . Sexual Activity:    Partners: Male   Other Topics Concern  . Not on file  Social History Narrative   Work or School: not working      Home Situation: lives with husband      Spiritual Beliefs: Christian      Lifestyle: eats healthy, not exercising          Review of Systems  Constitutional: Positive for unexpected weight change (12 lbs in 4 months ). Negative for fever and chills.  Respiratory: Negative for cough, shortness of breath and wheezing.   Cardiovascular: Negative for chest pain, palpitations and leg swelling.  Skin: Positive for rash.  Neurological: Positive for numbness (numbness intermittent left lower anterior leg) and headaches. Negative for dizziness, weakness and light-headedness.       Objective:   Filed Vitals:   12/19/14 1117  BP: 116/82  Pulse: 66  Temp: 98.1 F (36.7 C)  Resp: 16   Filed Weights   12/19/14 1117  Weight: 149 lb (67.586 kg)   Body mass index is 24.06 kg/(m^2).    Physical Exam  Constitutional: She appears well-developed and well-nourished. No distress.  Eyes: Conjunctivae are normal.  Neck: Neck supple. No tracheal deviation present. No thyromegaly present.  Musculoskeletal: She exhibits no edema.  Lymphadenopathy:    She has no cervical adenopathy.  Skin: She is not diaphoretic.          Assessment & Plan:   See Problem List.

## 2014-12-19 NOTE — Assessment & Plan Note (Signed)
Wants a second opinion regarding treatment Will refer to rheumatology

## 2014-12-19 NOTE — Assessment & Plan Note (Signed)
Minimally elevated calcium on a couple of occasions Not taking calcium supplements No symptoms Will recheck Ca and PTH

## 2014-12-19 NOTE — Progress Notes (Signed)
Pre visit review using our clinic review tool, if applicable. No additional management support is needed unless otherwise documented below in the visit note. 

## 2014-12-20 ENCOUNTER — Encounter: Payer: Self-pay | Admitting: Internal Medicine

## 2014-12-20 LAB — PTH, INTACT AND CALCIUM
Calcium: 10.2 mg/dL (ref 8.4–10.5)
PTH: 61 pg/mL (ref 14–64)

## 2014-12-21 LAB — FOOD ALLERGY PROFILE
Allergen Corn, IgE: 0.17 kU/L — AB
Clam IgE: <0.1 kU/L
Egg White IgE: <0.1 kU/L
Milk IgE: 0.15 kU/L — AB
PEANUT IGE: 0.14 kU/L — AB
SHRIMP IGE: 8.67 kU/L — AB
Sesame Seed IgE: 0.27 kU/L — AB
Soybean IgE: <0.1 kU/L
WALNUT IGE: 0.42 kU/L — AB
WHEAT IGE: 0.28 kU/L — AB

## 2014-12-23 ENCOUNTER — Encounter: Payer: Self-pay | Admitting: Internal Medicine

## 2014-12-25 ENCOUNTER — Telehealth: Payer: Self-pay | Admitting: Internal Medicine

## 2014-12-25 NOTE — Telephone Encounter (Signed)
agree

## 2014-12-25 NOTE — Telephone Encounter (Signed)
Please advise 

## 2014-12-25 NOTE — Telephone Encounter (Signed)
Patient Name: Donna Blankenship DOB: Jun 10, 1965 Initial Comment Caller States scalp is burning feeling, cant get it stop, very painful. Nurse Assessment Nurse: Charna Elizabethrumbull, RN, Cathy Date/Time (Eastern Time): 12/25/2014 10:31:19 AM Confirm and document reason for call. If symptomatic, describe symptoms. ---Caller states she developed an itching/burning rash on her scalp several months ago that became worse again yesterday. No fever. Has the patient traveled out of the country within the last 30 days? ---No Does the patient have any new or worsening symptoms? ---Yes Will a triage be completed? ---Yes Related visit to physician within the last 2 weeks? ---No Does the PT have any chronic conditions? (i.e. diabetes, asthma, etc.) ---Yes List chronic conditions. ---Lichen Planopilaris, High Calcium Did the patient indicate they were pregnant? ---No Is this a behavioral health or substance abuse call? ---No Guidelines Guideline Title Affirmed Question Affirmed Notes Rash or Redness - Localized [1] Looks infected (spreading redness, pus) AND [2] large red area (> 2 in. or 5 cm) Final Disposition User See Physician within 4 Hours (or PCP triage) Charna Elizabethrumbull, RN, Cathy Referrals No office appointment available. [facility] Disagree/Comply: Comply

## 2014-12-27 ENCOUNTER — Ambulatory Visit: Payer: BLUE CROSS/BLUE SHIELD | Admitting: Family

## 2015-01-01 ENCOUNTER — Ambulatory Visit: Payer: BLUE CROSS/BLUE SHIELD | Admitting: Internal Medicine

## 2015-01-01 ENCOUNTER — Telehealth: Payer: Self-pay | Admitting: Internal Medicine

## 2015-01-01 NOTE — Telephone Encounter (Signed)
Patient is requesting titer to be entered so she can go to lab.  Please follow up.

## 2015-01-02 NOTE — Telephone Encounter (Signed)
Spoke with pt to verify which titer she was needing. Pt is going to call back after she finds out which vaccines she will need a titer for.

## 2015-01-23 ENCOUNTER — Other Ambulatory Visit: Payer: Self-pay | Admitting: Internal Medicine

## 2015-02-27 ENCOUNTER — Ambulatory Visit (INDEPENDENT_AMBULATORY_CARE_PROVIDER_SITE_OTHER): Payer: BLUE CROSS/BLUE SHIELD | Admitting: Internal Medicine

## 2015-02-27 ENCOUNTER — Encounter: Payer: Self-pay | Admitting: Internal Medicine

## 2015-02-27 VITALS — BP 128/88 | HR 110 | Temp 98.2°F | Resp 16 | Wt 139.0 lb

## 2015-02-27 DIAGNOSIS — E7889 Other lipoprotein metabolism disorders: Secondary | ICD-10-CM | POA: Diagnosis not present

## 2015-02-27 DIAGNOSIS — G4700 Insomnia, unspecified: Secondary | ICD-10-CM | POA: Diagnosis not present

## 2015-02-27 DIAGNOSIS — E063 Autoimmune thyroiditis: Secondary | ICD-10-CM | POA: Insufficient documentation

## 2015-02-27 DIAGNOSIS — E7841 Elevated Lipoprotein(a): Secondary | ICD-10-CM

## 2015-02-27 MED ORDER — ZOLPIDEM TARTRATE ER 6.25 MG PO TBCR: 6.2500 mg | EXTENDED_RELEASE_TABLET | Freq: Every evening | ORAL | Status: DC | PRN

## 2015-02-27 MED ORDER — NALTREXONE HCL 50 MG PO TABS: 50.0000 mg | ORAL_TABLET | Freq: Every day | ORAL | Status: DC

## 2015-02-27 NOTE — Progress Notes (Signed)
Subjective:    Patient ID: Donna Blankenship, female    DOB: 1965-10-25, 50 y.o.   MRN: [id number]  HPI She is here for an acute visit for difficulty sleeping. She tried trazodone 100 mg at night.  It does get her to sleep, but does not get her to sleep.  She tried [place] and had bad dreams.  She has not slept much and is exhausted.  She has tried all natural remedies and nothing helps.  She just needs to sleep.   [facility]   She was found to have high lipoprotein A and has hashimoto's.  She is trying natural remedies to see if that will help, but knows she may need to consider a cholesterol lowering medication.  Medications and allergies reviewed with patient and updated if appropriate.  Patient Active Problem List   Diagnosis Date Noted  . Thyroid nodule 12/19/2014  . Eczema 12/19/2014  . Food allergy 12/19/2014  . Hypercalcemia 12/19/2014  . Discoid lupus 11/24/2014  . Lichen planopilaris 11/13/2014  . GAD (generalized anxiety disorder) 08/13/2014  . Dyspnea 04/20/2014  . Hyperglycemia 01/01/2014  . Traction alopecia 07/17/2013  . Pure hypercholesterolemia 06/16/2013  . PULMONARY FIBROSIS, POSTINFLAMMATORY 09/20/2007  . COLONIC POLYPS, HX OF 10/21/2006    Current Outpatient Prescriptions on File Prior to Visit  Medication Sig Dispense Refill  . clobetasol cream (TEMOVATE) 0.05 % Apply 1 application topically 2 (two) times daily. 30 g 0  . diazepam (VALIUM) 5 MG tablet Take 1 tablet (5 mg total) by mouth every 12 (twelve) hours as needed for anxiety. 30 tablet 0  . EPINEPHrine 0.3 mg/0.3 mL IJ SOAJ injection Inject 0.3 mLs (0.3 mg total) into the muscle once. 1 Device 3  . hydroxychloroquine (PLAQUENIL) 200 MG tablet      No current facility-administered medications on file prior to visit.    Past Medical History  Diagnosis Date  . Anemia     past H/o anemia  . Anxiety     on effexor and valium in the past  . Hyperglycemia    borderline  . Thyroid disease     past h/o hypothyroidism  . Dyspnea     seeing pulm, hx lupus pneumonitis, mild pulm fibrosis  . GERD 01/11/2008    Qualifier: Diagnosis of  By: Yetta Barre MD, Bernadene Bell.   . Fibromyalgia 01/18/2008    Qualifier: Diagnosis of  By: Yetta Barre MD, Bernadene Bell.   . Infertility, female     Past Surgical History  Procedure Laterality Date  . Abdominal hysterectomy  2004    for fibroid; retained ovaries  . Pelvic laparoscopy  1999    Social History   Social History  . Marital Status: Married    Spouse Name: N/A  . Number of Children: N/A  . Years of Education: N/A   Social History Main Topics  . Smoking status: Never Smoker   . Smokeless tobacco: None  . Alcohol Use: 1.0 oz/week    2 Standard drinks or equivalent per week     Comment: occ glass of wine  . Drug Use: No  . Sexual Activity:    Partners: Male   Other Topics Concern  . None   Social History Narrative   Work or School: not working      Home Situation: lives with husband      Spiritual Beliefs: Christian      Lifestyle: eats healthy, not exercising  Family History  Problem Relation Age of Onset  . Diabetes Mother   . Diabetes Father   . Stroke Father   . Anuerysm Father   . Sudden death Brother   . Heart attack Brother   . Diabetes Paternal Grandmother   . Breast cancer Maternal Grandmother     Review of Systems  Constitutional: Positive for appetite change (improved - not eating normally), fatigue and unexpected weight change (related to not eating due to stomach upset - improved).  Psychiatric/Behavioral: Positive for sleep disturbance. Negative for dysphoric mood. The patient is not nervous/anxious.        Objective:   Filed Vitals:   02/27/15 1438  BP: 128/88  Pulse: 110  Temp: 98.2 F (36.8 C)  Resp: 16   Filed Weights   02/27/15 1438  Weight: 139 lb (63.05 kg)   Body mass index is 22.45 kg/(m^2).   Physical Exam  Constitutional: She is oriented to  person, place, and time. She appears well-developed and well-nourished. No distress.  Neurological: She is alert and oriented to person, place, and time.  Psychiatric: She has a normal mood and affect. Her behavior is normal. Judgment and thought content normal.        Assessment & Plan:    See Problem List for Assessment and Plan of chronic medical problems.

## 2015-02-27 NOTE — Patient Instructions (Signed)
We will try ambien CR for the insomnia.  Take as prescribed and if it is not effective let me know.

## 2015-02-27 NOTE — Progress Notes (Signed)
Pre visit review using our clinic review tool, if applicable. No additional management support is needed unless otherwise documented below in the visit note. 

## 2015-02-27 NOTE — Assessment & Plan Note (Signed)
Recommended considering a stating - discussed increased risk of CVD She will consider and return if she is interested she will return

## 2015-02-27 NOTE — Assessment & Plan Note (Signed)
Natural remedies and trazodone not effective Discussed options She would like to try ambien again -- will try CR - may need to change to ambien 5 mg - she will let me know if the CR is not effective

## 2015-03-06 ENCOUNTER — Telehealth: Payer: Self-pay | Admitting: *Deleted

## 2015-03-06 MED ORDER — DIAZEPAM 5 MG PO TABS: 5.0000 mg | ORAL_TABLET | Freq: Two times a day (BID) | ORAL | Status: DC | PRN

## 2015-03-06 NOTE — Telephone Encounter (Signed)
Received call pt is requesting refills on her Diazepam.../lmb

## 2015-03-06 NOTE — Telephone Encounter (Signed)
rx printed

## 2015-03-06 NOTE — Telephone Encounter (Signed)
Called pt no answer LMOM rx fax to [facility].../lmb 

## 2015-04-29 ENCOUNTER — Other Ambulatory Visit (INDEPENDENT_AMBULATORY_CARE_PROVIDER_SITE_OTHER): Payer: BLUE CROSS/BLUE SHIELD

## 2015-04-29 ENCOUNTER — Telehealth: Payer: Self-pay | Admitting: Internal Medicine

## 2015-04-29 DIAGNOSIS — R300 Dysuria: Secondary | ICD-10-CM | POA: Diagnosis not present

## 2015-04-29 LAB — URINALYSIS, ROUTINE W REFLEX MICROSCOPIC
BILIRUBIN URINE: NEGATIVE
HGB URINE DIPSTICK: NEGATIVE
Ketones, ur: NEGATIVE
Nitrite: NEGATIVE
PH: 6.5 (ref 5.0–8.0)
RBC / HPF: NONE SEEN (ref 0–?)
Specific Gravity, Urine: <=1.005 — AB (ref 1.000–1.030)
TOTAL PROTEIN, URINE-UPE24: NEGATIVE
Urine Glucose: NEGATIVE
Urobilinogen, UA: 0.2 (ref 0.0–1.0)

## 2015-04-29 NOTE — Telephone Encounter (Signed)
Please advise 

## 2015-04-29 NOTE — Telephone Encounter (Signed)
Patient thinks she has a UTI.  Had one opening left with Plot this afternoon but patient has to take her mother to a doctors appt.  Patient would like to know if she can go to the lab.  Please follow up with patient in regards.

## 2015-04-29 NOTE — Telephone Encounter (Signed)
Ok to leave sample - Ua and cx

## 2015-04-29 NOTE — Telephone Encounter (Signed)
UA and Cx ordered. Pt informed.

## 2015-04-30 ENCOUNTER — Telehealth: Payer: Self-pay

## 2015-04-30 NOTE — Telephone Encounter (Signed)
Phone call was disconnected.

## 2015-04-30 NOTE — Telephone Encounter (Signed)
Patient would like for you to call her and go over her labs from yesterday with her. Please follow up. Thank you.

## 2015-04-30 NOTE — Telephone Encounter (Signed)
Spoke with pt to inform. Will call her back when we receive results for culture.

## 2015-05-01 ENCOUNTER — Other Ambulatory Visit: Payer: Self-pay | Admitting: Internal Medicine

## 2015-05-01 MED ORDER — NITROFURANTOIN MONOHYD MACRO 100 MG PO CAPS: 100.0000 mg | ORAL_CAPSULE | Freq: Two times a day (BID) | ORAL | Status: AC

## 2015-05-02 LAB — URINE CULTURE

## 2015-05-06 ENCOUNTER — Ambulatory Visit: Payer: BLUE CROSS/BLUE SHIELD | Admitting: Internal Medicine

## 2015-05-09 ENCOUNTER — Telehealth: Payer: Self-pay | Admitting: *Deleted

## 2015-05-09 MED ORDER — DIAZEPAM 5 MG PO TABS: 5.0000 mg | ORAL_TABLET | Freq: Two times a day (BID) | ORAL | Status: DC | PRN

## 2015-05-09 NOTE — Telephone Encounter (Signed)
Received call pt is requesting refills on her diazepam.../lmb

## 2015-05-09 NOTE — Telephone Encounter (Signed)
printed

## 2015-05-09 NOTE — Telephone Encounter (Signed)
RX faxed to [facility] 

## 2015-05-24 ENCOUNTER — Ambulatory Visit (INDEPENDENT_AMBULATORY_CARE_PROVIDER_SITE_OTHER): Payer: BLUE CROSS/BLUE SHIELD | Admitting: Internal Medicine

## 2015-05-24 ENCOUNTER — Other Ambulatory Visit: Payer: BLUE CROSS/BLUE SHIELD

## 2015-05-24 ENCOUNTER — Encounter: Payer: Self-pay | Admitting: Internal Medicine

## 2015-05-24 VITALS — BP 110/70 | HR 67 | Temp 98.1°F | Ht 66.0 in | Wt 140.1 lb

## 2015-05-24 DIAGNOSIS — R202 Paresthesia of skin: Secondary | ICD-10-CM

## 2015-05-24 DIAGNOSIS — R0600 Dyspnea, unspecified: Secondary | ICD-10-CM

## 2015-05-24 DIAGNOSIS — E7800 Pure hypercholesterolemia, unspecified: Secondary | ICD-10-CM

## 2015-05-24 DIAGNOSIS — R300 Dysuria: Secondary | ICD-10-CM

## 2015-05-24 DIAGNOSIS — E7889 Other lipoprotein metabolism disorders: Secondary | ICD-10-CM

## 2015-05-24 DIAGNOSIS — R0602 Shortness of breath: Secondary | ICD-10-CM | POA: Diagnosis not present

## 2015-05-24 DIAGNOSIS — Z79899 Other long term (current) drug therapy: Secondary | ICD-10-CM | POA: Diagnosis not present

## 2015-05-24 DIAGNOSIS — E7841 Elevated Lipoprotein(a): Secondary | ICD-10-CM

## 2015-05-24 LAB — POC URINALSYSI DIPSTICK (AUTOMATED)
BILIRUBIN UA: NEGATIVE
Blood, UA: NEGATIVE
GLUCOSE UA: NEGATIVE
KETONES UA: NEGATIVE
Leukocytes, UA: NEGATIVE
Nitrite, UA: NEGATIVE
Protein, UA: NEGATIVE
Spec Grav, UA: 1.015
Urobilinogen, UA: 0.2
pH, UA: 6

## 2015-05-24 NOTE — Progress Notes (Signed)
Subjective:    Patient ID: Donna Blankenship, female    DOB: 12-10-1965, 50 y.o.   MRN: [id number]  HPI  She is here for an acute visit.   When she is falling asleep her hands get tingly/numb.  She can be laying down or be in a recliner.  She does not have symptoms during the day.  She sometimes has neck stiffness or pain at times.  She moves her hands and the tingling/numbness resolves.  She denies hand pain or weakness.  Elevated lipoprotein a:  She found out through integrative medicine that she has an elevated lipoprotein a or "sticky blood".  She is concerned about her risk for heart disease and wonders if she should have a heart work up.  She is starting to exercise regularly and is increasing her exercise.  She has no difficulty exercising.  She sometimes has shortness of breath.  It can occur at rest or with exertion.  It occurs randomly and has been going on for a while.  She denies chest pain or palpitations.   Dysuria intermittent, increased frequency:  She has frequency, dysuria on occasion and weak stream at times.  She was concerned about a possible UTI.  She did see a urologist in the past and was told her urethra was strictured - she did not have a dilation and is not sure she wants to have one.   High cholesterol:  She doe snot want to take a statin.  She was taking red yeast rice and it was helping, but she can not take it any longer because her integrative medicine doctor has diagnosed her with candida in her bowel - she will be starting a treatment for that soon.   Medications and allergies reviewed with patient and updated if appropriate.  Patient Active Problem List   Diagnosis Date Noted  . Hashimoto's thyroiditis 02/27/2015  . Elevated lipoprotein(a) 02/27/2015  . Insomnia 02/27/2015  . Thyroid nodule 12/19/2014  . Eczema 12/19/2014  . Food allergy 12/19/2014  . Hypercalcemia 12/19/2014  . Discoid lupus 11/24/2014  . Lichen planopilaris 11/13/2014  . GAD  (generalized anxiety disorder) 08/13/2014  . Dyspnea 04/20/2014  . Hyperglycemia 01/01/2014  . Pure hypercholesterolemia 06/16/2013  . PULMONARY FIBROSIS, POSTINFLAMMATORY 09/20/2007  . COLONIC POLYPS, HX OF 10/21/2006    Current Outpatient Prescriptions on File Prior to Visit  Medication Sig Dispense Refill  . hydroxychloroquine (PLAQUENIL) 200 MG tablet     . naltrexone (DEPADE) 50 MG tablet Take 1 tablet (50 mg total) by mouth daily.    . diazepam (VALIUM) 5 MG tablet Take 1 tablet (5 mg total) by mouth every 12 (twelve) hours as needed for anxiety. (Patient not taking: Reported on 05/24/2015) 30 tablet 0  . EPINEPHrine 0.3 mg/0.3 mL IJ SOAJ injection Inject 0.3 mLs (0.3 mg total) into the muscle once. (Patient not taking: Reported on 05/24/2015) 1 Device 3  . zolpidem (AMBIEN CR) 6.25 MG CR tablet Take 1 tablet (6.25 mg total) by mouth at bedtime as needed for sleep. (Patient not taking: Reported on 05/24/2015) 30 tablet 0   No current facility-administered medications on file prior to visit.    Past Medical History  Diagnosis Date  . Anemia     past H/o anemia  . Anxiety     on effexor and valium in the past  . Hyperglycemia     borderline  . Thyroid disease     past h/o hypothyroidism  . Dyspnea  seeing pulm, hx lupus pneumonitis, mild pulm fibrosis  . GERD 01/11/2008    Qualifier: Diagnosis of  By: Yetta BarreJones MD, Bernadene Bellhomas L.   . Fibromyalgia 01/18/2008    Qualifier: Diagnosis of  By: Yetta BarreJones MD, Bernadene Bellhomas L.   . Infertility, female     Past Surgical History  Procedure Laterality Date  . Abdominal hysterectomy  2004    for fibroid; retained ovaries  . Pelvic laparoscopy  1999    Social History   Social History  . Marital Status: Married    Spouse Name: N/A  . Number of Children: N/A  . Years of Education: N/A   Social History Main Topics  . Smoking status: Never Smoker   . Smokeless tobacco: None  . Alcohol Use: 1.0 oz/week    2 Standard drinks or equivalent per week       Comment: occ glass of wine  . Drug Use: No  . Sexual Activity:    Partners: Male   Other Topics Concern  . None   Social History Narrative   Work or School: not working      Home Situation: lives with husband      Spiritual Beliefs: Christian      Lifestyle: eats healthy, not exercising          Family History  Problem Relation Age of Onset  . Diabetes Mother   . Diabetes Father   . Stroke Father   . Anuerysm Father   . Sudden death Brother   . Heart attack Brother   . Diabetes Paternal Grandmother   . Breast cancer Maternal Grandmother     Review of Systems  Constitutional: Negative for fever.  Respiratory: Positive for cough (from URI) and shortness of breath (occasional, rest or exertion). Negative for wheezing.   Cardiovascular: Negative for chest pain, palpitations and leg swelling.  Gastrointestinal: Negative for abdominal pain.  Genitourinary: Positive for dysuria, frequency and difficulty urinating. Negative for hematuria.  Neurological: Negative for light-headedness and headaches.       Objective:   Filed Vitals:   05/24/15 1638  BP: 110/70  Pulse: 67  Temp: 98.1 F (36.7 C)   Filed Weights   05/24/15 1638  Weight: 140 lb 2 oz (63.56 kg)   Body mass index is 22.63 kg/(m^2).   Physical Exam Constitutional: Appears well-developed and well-nourished. No distress.  Neck: Neck supple. No tracheal deviation present. No thyromegaly present.  No carotid bruit. No cervical adenopathy.   Cardiovascular: Normal rate, regular rhythm and normal heart sounds.   No murmur heard.  No edema Pulmonary/Chest: Effort normal and breath sounds normal. No respiratory distress. No wheezes.      Assessment & Plan:   See Problem List for Assessment and Plan of chronic medical problems.

## 2015-05-24 NOTE — Patient Instructions (Signed)
We will call you with the results of the urine.   A cardiology referral has been ordered.   Let me know if you want to see a urologist.

## 2015-05-25 DIAGNOSIS — R300 Dysuria: Secondary | ICD-10-CM | POA: Insufficient documentation

## 2015-05-25 DIAGNOSIS — R202 Paresthesia of skin: Secondary | ICD-10-CM | POA: Insufficient documentation

## 2015-05-25 DIAGNOSIS — R0602 Shortness of breath: Secondary | ICD-10-CM | POA: Insufficient documentation

## 2015-05-25 NOTE — Assessment & Plan Note (Signed)
Discussed elevated risk for heart attack and stroke Advised a baby ASA and statin - she may start a ASA 81 mg but does not want to take a statin Will refer to cardio for their opinion

## 2015-05-25 NOTE — Assessment & Plan Note (Signed)
Not able to take red yeast rice at this time, but this was helping  Does not want to take a statin - I did advise taking a statin due to her elevated lipoprotein a

## 2015-05-25 NOTE — Assessment & Plan Note (Signed)
Intermittent - UA today without evidence of infection Will send culture Likely related to urethral stricture, which urology told her in the past she had Declined urology referral at this time, but if she continues to have symptoms she will see them again

## 2015-05-25 NOTE — Assessment & Plan Note (Signed)
Occurs only when laying down - bed or recliner Does not occur during the day Likely CTS or cervical in nature Discussed EMG  - will hold off for now Just monitor  Try adjusting sleep position

## 2015-05-25 NOTE — Assessment & Plan Note (Signed)
Intermittent SOB that occurs randomly at rest and with exertion Unlikely cardiac or pulmonary Referring to cardio for their opinion regarding lipoprotein a and elevated cholesterol - she will discuss her sob with them as well

## 2015-05-26 ENCOUNTER — Encounter: Payer: Self-pay | Admitting: Internal Medicine

## 2015-05-26 LAB — URINE CULTURE: Colony Count: 25000

## 2015-05-31 DIAGNOSIS — Z01411 Encounter for gynecological examination (general) (routine) with abnormal findings: Secondary | ICD-10-CM | POA: Diagnosis not present

## 2015-05-31 DIAGNOSIS — N952 Postmenopausal atrophic vaginitis: Secondary | ICD-10-CM | POA: Diagnosis not present

## 2015-05-31 DIAGNOSIS — Z01419 Encounter for gynecological examination (general) (routine) without abnormal findings: Secondary | ICD-10-CM | POA: Diagnosis not present

## 2015-05-31 DIAGNOSIS — Z6823 Body mass index (BMI) 23.0-23.9, adult: Secondary | ICD-10-CM | POA: Diagnosis not present

## 2015-06-06 ENCOUNTER — Other Ambulatory Visit: Payer: Self-pay | Admitting: Internal Medicine

## 2015-06-14 ENCOUNTER — Encounter: Payer: Self-pay | Admitting: Internal Medicine

## 2015-07-19 ENCOUNTER — Telehealth: Payer: Self-pay | Admitting: Internal Medicine

## 2015-07-19 DIAGNOSIS — D8989 Other specified disorders involving the immune mechanism, not elsewhere classified: Secondary | ICD-10-CM

## 2015-07-19 NOTE — Telephone Encounter (Signed)
Pt called in and said that she is wanting Dr burns to put in an referral for into rheumatologists and would like to talk to nurse about put some blood work in before she come for appt?

## 2015-07-19 NOTE — Telephone Encounter (Signed)
Pt states she has called a couple Rheumatologists in the area and they have a 5-6 week wait for an appt. She would like to have auto immune blood work done as well as cholesterol. Please advise, pt can come Monday to get blood work done.

## 2015-07-20 NOTE — Telephone Encounter (Addendum)
We can do some blood work but rheumatology will probably want additional blood work so she still may need to have blood work with them.   If she wants to do some blood work can order- lipid, cmp, cbc, ANA, ds DNA, esr and crp

## 2015-07-22 NOTE — Telephone Encounter (Signed)
ordered

## 2015-07-22 NOTE — Addendum Note (Signed)
Addended by: Zenovia JordanMITCHELL, Lynasia Meloche B on: 07/22/2015 10:48 AM   Modules accepted: Orders

## 2015-07-22 NOTE — Telephone Encounter (Signed)
Was able to enter everything but the ds dna, please advise.

## 2015-07-22 NOTE — Addendum Note (Signed)
Addended by: Pincus SanesBURNS, Laytoya Ion J on: 07/22/2015 12:38 PM   Modules accepted: Orders

## 2015-08-01 ENCOUNTER — Telehealth: Payer: Self-pay | Admitting: Emergency Medicine

## 2015-08-01 NOTE — Telephone Encounter (Signed)
Pt would like to have labs done to check her thyroid antibodies.. Please advise.   Informed her Mother's and Brother's scat form is ready for pick up

## 2015-08-01 NOTE — Telephone Encounter (Signed)
Pt had question about your blood work and wanted to know if you can check her thyroid panel as well. Please follow up thanks.

## 2015-08-01 NOTE — Telephone Encounter (Signed)
I don't think she needs to have them tested - they have been positive in the past -- once positive they will always be positive.

## 2015-08-02 NOTE — Telephone Encounter (Signed)
LVM informing pt

## 2015-08-05 ENCOUNTER — Ambulatory Visit: Payer: BLUE CROSS/BLUE SHIELD | Admitting: Internal Medicine

## 2015-08-09 DIAGNOSIS — R739 Hyperglycemia, unspecified: Secondary | ICD-10-CM | POA: Diagnosis not present

## 2015-08-09 DIAGNOSIS — E063 Autoimmune thyroiditis: Secondary | ICD-10-CM | POA: Diagnosis not present

## 2015-08-09 DIAGNOSIS — R5383 Other fatigue: Secondary | ICD-10-CM | POA: Diagnosis not present

## 2015-08-09 DIAGNOSIS — E559 Vitamin D deficiency, unspecified: Secondary | ICD-10-CM | POA: Diagnosis not present

## 2015-08-19 ENCOUNTER — Encounter: Payer: Self-pay | Admitting: Cardiovascular Disease

## 2015-08-19 ENCOUNTER — Ambulatory Visit (INDEPENDENT_AMBULATORY_CARE_PROVIDER_SITE_OTHER): Payer: BLUE CROSS/BLUE SHIELD | Admitting: Cardiovascular Disease

## 2015-08-19 VITALS — BP 116/70 | HR 68 | Ht 66.0 in | Wt 139.4 lb

## 2015-08-19 DIAGNOSIS — E785 Hyperlipidemia, unspecified: Secondary | ICD-10-CM | POA: Diagnosis not present

## 2015-08-19 DIAGNOSIS — Z8249 Family history of ischemic heart disease and other diseases of the circulatory system: Secondary | ICD-10-CM

## 2015-08-19 NOTE — Patient Instructions (Addendum)
Medication Instructions:  Your physician recommends that you continue on your current medications as directed. Please refer to the Current Medication list given to you today.  Labwork: NONE  Testing/Procedures: CALCIUM SCORING   Follow-Up: Your physician wants you to follow-up in: 1 YEAR OV You will receive a reminder letter in the mail two months in advance. If you don't receive a letter, please call our office to schedule the follow-up appointment.   If you need a refill on your cardiac medications before your next appointment, please call your pharmacy.

## 2015-08-19 NOTE — Progress Notes (Signed)
Cardiology Office Note   Date:  08/19/2015   ID:  Donna Blankenship, DOB [birth year], MRN [id number]  PCP:  Donna Sanes, MD  Cardiologist:   Donna Si, MD   Chief Complaint  Patient presents with  . new patient eval.    pt c/o occasional SOB--comes and goes, random  has autoimmune disease, has had lupus before     History of Present Illness: Donna Blankenship is a 50 y.o. female with discoid Lupus, Hashimoto's thyroiditis, and hyperlipidemia who presents for cardiovascular risk assessment.  She has been feeling well and denies chest pain or shortness of breath.  She has not been exercising much lately but reports having a very healthy diet.  She had a lupus flare lately and reports significant fatigue as well as a 25 lb weight loss.  Lately she has been feeling better and purchased a new bike.  She rode for 20 miles last week and denied exertional symptoms.  She has not noted any lower extremity edema, orthopnea or PND.    Donna Blankenship has a strong family history of CAD.  Her father had a heart attack at age 51 and her grandfather had a heart attack at age 53.  Her brother recently had a heart attack and died at age 26.  She reports a history of hyperlipidemia but has not been on any medication.  She took red yeast rice which lowered her numbers nicely.  However, she was unable to tolerate it due to GI upset.    Past Medical History:  Diagnosis Date  . Anemia    past H/o anemia  . Anxiety    on effexor and valium in the past  . Dyspnea    seeing pulm, hx lupus pneumonitis, mild pulm fibrosis  . Fibromyalgia 01/18/2008   Qualifier: Diagnosis of  By: Yetta Barre MD, Bernadene Bell.   Marland Kitchen GERD 01/11/2008   Qualifier: Diagnosis of  By: Yetta Barre MD, Bernadene Bell.   . Hyperglycemia    borderline  . Infertility, female   . Thyroid disease    past h/o hypothyroidism    Past Surgical History:  Procedure Laterality Date  . ABDOMINAL HYSTERECTOMY  2004   for fibroid; retained ovaries  . PELVIC  LAPAROSCOPY  1999     Current Outpatient Prescriptions  Medication Sig Dispense Refill  . EPINEPHrine 0.3 mg/0.3 mL IJ SOAJ injection Inject 0.3 mLs (0.3 mg total) into the muscle once. 1 Device 3  . hydroxychloroquine (PLAQUENIL) 200 MG tablet     . naltrexone (DEPADE) 50 MG tablet Take 1 tablet (50 mg total) by mouth daily.    Marland Kitchen zolpidem (AMBIEN CR) 6.25 MG CR tablet TAKE 1 TABLET BY MOUTH AT BEDTIME AS NEEDED FOR SLEEP 30 tablet 0   No current facility-administered medications for this visit.     Allergies:   Latex and Sulfamethoxazole    Social History:  The patient  reports that she has never smoked. She does not have any smokeless tobacco history on file. She reports that she drinks about 1.0 oz of alcohol per week . She reports that she does not use drugs.   Family History:  The patient's family history includes Anuerysm in her father; Breast cancer in her maternal grandmother; Diabetes in her father, mother, and paternal grandmother; Heart attack in her brother; Stroke in her father; Sudden death in her brother.    ROS:  Please see the history of present illness.   Otherwise, review of systems are  positive for none.   All other systems are reviewed and negative.    PHYSICAL EXAM: VS:  BP 116/70   Pulse 68   Ht 5\' 6"  (1.676 m)   Wt 139 lb 6.4 oz (63.2 kg)   LMP 01/05/2002   BMI 22.50 kg/m  , BMI Body mass index is 22.5 kg/m. GENERAL:  Well appearing HEENT:  Pupils equal round and reactive, fundi not visualized, oral mucosa unremarkable NECK:  No jugular venous distention, waveform within normal limits, carotid upstroke brisk and symmetric, no bruits, no thyromegaly LYMPHATICS:  No cervical adenopathy LUNGS:  Clear to auscultation bilaterally HEART:  RRR.  PMI not displaced or sustained,S1 and S2 within normal limits, no S3, no S4, no clicks, no rubs, no murmurs ABD:  Flat, positive bowel sounds normal in frequency in pitch, no bruits, no rebound, no guarding, no midline  pulsatile mass, no hepatomegaly, no splenomegaly EXT:  2 plus pulses throughout, no edema, no cyanosis no clubbing SKIN:  No rashes no nodules NEURO:  Cranial nerves II through XII grossly intact, motor grossly intact throughout PSYCH:  Cognitively intact, oriented to person place and time   EKG:  EKG is ordered today. The ekg ordered today demonstrates sinus rhythm.  Rate 68 bpm.    Recent Labs: 10/30/2014: Hemoglobin 13.3; Platelets 191.0; TSH 1.44 12/19/2014: ALT 10; BUN 9; Creatinine, Ser 1.16; Potassium 4.1; Sodium 138    Lipid Panel    Component Value Date/Time   CHOL 246 (H) 10/30/2014 1305   TRIG 55.0 10/30/2014 1305   HDL 82.30 10/30/2014 1305   CHOLHDL 3 10/30/2014 1305   VLDL 11.0 10/30/2014 1305   LDLCALC 153 (H) 10/30/2014 1305      Wt Readings from Last 3 Encounters:  08/19/15 139 lb 6.4 oz (63.2 kg)  05/24/15 140 lb 2 oz (63.6 kg)  02/27/15 139 lb (63 kg)      ASSESSMENT AND PLAN:  # CV Disease Prevention: # Family history: [name] has a strong family history of cardiovascular disease.  She is currently asymptomatic.  Her cholesterol levels are elevated but her ASCVD 10 year risk is 0.6%.  Therefore we will get a coronary calcium score to better risk stratify her.  She tried taking red yeast rice but had GI upset in the past.  Based on her ASCVD 10 year risk she does not need aspirin.  If her calcium score is elevated she will consider taking aspirin 81mg  daily    Current medicines are reviewed at length with the patient today.  The patient does not have concerns regarding medicines.  The following changes have been made:  no change  Labs/ tests ordered today include:  No orders of the defined types were placed in this encounter.    Disposition:   FU with Donna Blankenship C. Donna Salvia, MD, [facility] in 1 year.    This note was written with the assistance of speech recognition software.  Please excuse any transcriptional errors.  Signed, Donna Blankenship C. Donna Salvia,  MD, [facility]  08/19/2015 3:11 PM    [facility]

## 2015-08-29 ENCOUNTER — Ambulatory Visit (INDEPENDENT_AMBULATORY_CARE_PROVIDER_SITE_OTHER): Payer: BLUE CROSS/BLUE SHIELD | Admitting: Internal Medicine

## 2015-08-29 ENCOUNTER — Inpatient Hospital Stay: Admission: RE | Admit: 2015-08-29 | Payer: BLUE CROSS/BLUE SHIELD | Source: Ambulatory Visit

## 2015-08-29 ENCOUNTER — Encounter: Payer: Self-pay | Admitting: Internal Medicine

## 2015-08-29 VITALS — BP 112/74 | HR 50 | Temp 97.5°F | Resp 16 | Wt 136.0 lb

## 2015-08-29 DIAGNOSIS — Z1231 Encounter for screening mammogram for malignant neoplasm of breast: Secondary | ICD-10-CM

## 2015-08-29 DIAGNOSIS — L661 Lichen planopilaris: Secondary | ICD-10-CM | POA: Diagnosis not present

## 2015-08-29 DIAGNOSIS — G4700 Insomnia, unspecified: Secondary | ICD-10-CM | POA: Diagnosis not present

## 2015-08-29 DIAGNOSIS — Z79899 Other long term (current) drug therapy: Secondary | ICD-10-CM | POA: Diagnosis not present

## 2015-08-29 DIAGNOSIS — Z09 Encounter for follow-up examination after completed treatment for conditions other than malignant neoplasm: Secondary | ICD-10-CM | POA: Diagnosis not present

## 2015-08-29 DIAGNOSIS — L930 Discoid lupus erythematosus: Secondary | ICD-10-CM | POA: Diagnosis not present

## 2015-08-29 DIAGNOSIS — R2230 Localized swelling, mass and lump, unspecified upper limb: Secondary | ICD-10-CM | POA: Insufficient documentation

## 2015-08-29 DIAGNOSIS — Z049 Encounter for examination and observation for unspecified reason: Secondary | ICD-10-CM | POA: Diagnosis not present

## 2015-08-29 DIAGNOSIS — E063 Autoimmune thyroiditis: Secondary | ICD-10-CM

## 2015-08-29 DIAGNOSIS — R2232 Localized swelling, mass and lump, left upper limb: Secondary | ICD-10-CM

## 2015-08-29 DIAGNOSIS — H04123 Dry eye syndrome of bilateral lacrimal glands: Secondary | ICD-10-CM | POA: Diagnosis not present

## 2015-08-29 MED ORDER — DIAZEPAM 5 MG PO TABS: 5.0000 mg | ORAL_TABLET | Freq: Two times a day (BID) | ORAL | 0 refills | Status: DC | PRN

## 2015-08-29 MED ORDER — SCOPOLAMINE 1 MG/3DAYS TD PT72: 1.0000 | MEDICATED_PATCH | TRANSDERMAL | 0 refills | Status: DC

## 2015-08-29 NOTE — Patient Instructions (Signed)
A mammogram was ordered.   A referral was ordered for rheumatology.   Your prescriptions were sent to your pharmacy.

## 2015-08-29 NOTE — Assessment & Plan Note (Signed)
Takes ambien only as needed 

## 2015-08-29 NOTE — Assessment & Plan Note (Addendum)
Following with a naturopath  Has intermittent burning sensation in scalp  Diazepam as needed, which helps Refilled today

## 2015-08-29 NOTE — Progress Notes (Signed)
Pre visit review using our clinic review tool, if applicable. No additional management support is needed unless otherwise documented below in the visit note. 

## 2015-08-29 NOTE — Progress Notes (Signed)
Subjective:    Patient ID: Donna Blankenship, female    DOB: [birth year], [age] y.o.   MRN: [id number]  HPI She is here for follow up.  Left axilla:  She has a lump - it was there two months ago. There is no pain. No change in last couple of months. She denies lumps in her breast or other lumps in the axilla.  She denies infection in the left arm.  Her last mammogram was 3-4 years ago.    She needs a new referral to rheumatology.  She needs to follow up regarding her lichen planus and discoid lupus.  She is taking the plaquenil and feels it has helped.    She is going on a cruise and would like motion sickness patches. The lichen planus causes a burning sensation in her head.  The diazepam helps calm this down and she would like a prescription for it.    She uses [place] on occasion.    Medications and allergies reviewed with patient and updated if appropriate.  Patient Active Problem List   Diagnosis Date Noted  . Dysuria 05/25/2015  . SOB (shortness of breath) 05/25/2015  . Paresthesia of both hands 05/25/2015  . Hashimoto's thyroiditis 02/27/2015  . Elevated lipoprotein(a) 02/27/2015  . Insomnia 02/27/2015  . Thyroid nodule 12/19/2014  . Eczema 12/19/2014  . Food allergy 12/19/2014  . Hypercalcemia 12/19/2014  . Discoid lupus 11/24/2014  . Lichen planopilaris 11/13/2014  . GAD (generalized anxiety disorder) 08/13/2014  . Dyspnea 04/20/2014  . Hyperglycemia 01/01/2014  . Pure hypercholesterolemia 06/16/2013  . PULMONARY FIBROSIS, POSTINFLAMMATORY 09/20/2007  . COLONIC POLYPS, HX OF 10/21/2006    Current Outpatient Prescriptions on File Prior to Visit  Medication Sig Dispense Refill  . EPINEPHrine 0.3 mg/0.3 mL IJ SOAJ injection Inject 0.3 mLs (0.3 mg total) into the muscle once. 1 Device 3  . hydroxychloroquine (PLAQUENIL) 200 MG tablet     . naltrexone (DEPADE) 50 MG tablet Take 1 tablet (50 mg total) by mouth daily.    Marland Kitchen. zolpidem (AMBIEN CR) 6.25 MG CR tablet TAKE 1  TABLET BY MOUTH AT BEDTIME AS NEEDED FOR SLEEP 30 tablet 0   No current facility-administered medications on file prior to visit.     Past Medical History:  Diagnosis Date  . Anemia    past H/o anemia  . Anxiety    on effexor and valium in the past  . Dyspnea    seeing pulm, hx lupus pneumonitis, mild pulm fibrosis  . Fibromyalgia 01/18/2008   Qualifier: Diagnosis of  By: Yetta BarreJones MD, Bernadene Bellhomas L.   Marland Kitchen. GERD 01/11/2008   Qualifier: Diagnosis of  By: Yetta BarreJones MD, Bernadene Bellhomas L.   . Hyperglycemia    borderline  . Infertility, female   . Thyroid disease    past h/o hypothyroidism    Past Surgical History:  Procedure Laterality Date  . ABDOMINAL HYSTERECTOMY  2004   for fibroid; retained ovaries  . PELVIC LAPAROSCOPY  1999    Social History   Social History  . Marital status: Married    Spouse name: N/A  . Number of children: N/A  . Years of education: N/A   Social History Main Topics  . Smoking status: Never Smoker  . Smokeless tobacco: None  . Alcohol use 1.0 oz/week    2 Standard drinks or equivalent per week     Comment: occ glass of wine  . Drug use: No  . Sexual activity: Yes  Partners: Male   Other Topics Concern  . None   Social History Narrative   Work or School: not working      Home Situation: lives with husband      Spiritual Beliefs: Christian      Lifestyle: eats healthy, not exercising          Family History  Problem Relation Age of Onset  . Diabetes Mother   . Diabetes Father   . Stroke Father   . Anuerysm Father   . Sudden death Brother   . Heart attack Brother   . Diabetes Paternal Grandmother   . Breast cancer Maternal Grandmother     Review of Systems  Constitutional: Negative for chills, fatigue, fever and unexpected weight change.  Respiratory: Negative for cough, shortness of breath and wheezing.   Cardiovascular: Negative for chest pain, palpitations and leg swelling.  Skin: Negative for rash.  Neurological: Negative for  light-headedness and headaches.       Objective:   Vitals:   08/29/15 1050  BP: 112/74  Pulse: (!) 50  Resp: 16  Temp: 97.5 F (36.4 C)   Filed Weights   08/29/15 1050  Weight: 136 lb (61.7 kg)   Body mass index is 21.95 kg/m.   Physical Exam Constitutional: Appears well-developed and well-nourished. No distress.  HENT:  Head: Normocephalic and atraumatic.  Neck: Neck supple. No tracheal deviation present. No thyromegaly present.  Cardiovascular: Normal rate, regular rhythm and normal heart sounds.   No murmur heard. No carotid bruit  Pulmonary/Chest: Effort normal and breath sounds normal. No respiratory distress. No has no wheezes. No rales.  Breast: deferred  - will go for mammogram Axilla, left: non-tender lump that is mobile, no redness, no other lumps Musculoskeletal: No edema.  Lymphadenopathy: No cervical adenopathy.  Skin: Skin is warm and dry. Not diaphoretic. no rash Psychiatric: Normal mood and affect. Behavior is normal.       Assessment & Plan:   See Problem List for Assessment and Plan of chronic medical problems.

## 2015-08-29 NOTE — Assessment & Plan Note (Signed)
Would like to see a new rheumatologist - one ideally open to more natural remedies Doing well on plaquenil - will continue Referral ordered today

## 2015-08-29 NOTE — Assessment & Plan Note (Signed)
Discussed diagnostic versus Screening mammogram and she would prefer to have a screening mammogram She would like to hold off on any further evaluation of the axilla-we'll just monitor for now If lump does not resolve - we'll consider ultrasound

## 2015-08-29 NOTE — Assessment & Plan Note (Signed)
Managed by natural vitamins - managed by naturopath

## 2015-09-04 ENCOUNTER — Telehealth: Payer: Self-pay | Admitting: Internal Medicine

## 2015-09-04 NOTE — Telephone Encounter (Signed)
Please follow up with patient.  Returning your phone call.

## 2015-09-05 NOTE — Telephone Encounter (Signed)
Patient called again. Can you please follow up with her, Thank you.

## 2015-09-05 NOTE — Telephone Encounter (Signed)
Spoke with pt, requesting that we send in a form signed for her Brother once received via fax.

## 2015-10-04 ENCOUNTER — Inpatient Hospital Stay: Admission: RE | Admit: 2015-10-04 | Payer: BLUE CROSS/BLUE SHIELD | Source: Ambulatory Visit

## 2015-10-07 ENCOUNTER — Ambulatory Visit: Payer: BLUE CROSS/BLUE SHIELD

## 2015-10-11 ENCOUNTER — Inpatient Hospital Stay: Admission: RE | Admit: 2015-10-11 | Payer: BLUE CROSS/BLUE SHIELD | Source: Ambulatory Visit

## 2015-11-05 DIAGNOSIS — L509 Urticaria, unspecified: Secondary | ICD-10-CM | POA: Diagnosis not present

## 2015-11-05 DIAGNOSIS — R768 Other specified abnormal immunological findings in serum: Secondary | ICD-10-CM | POA: Diagnosis not present

## 2015-11-05 DIAGNOSIS — L661 Lichen planopilaris: Secondary | ICD-10-CM | POA: Diagnosis not present

## 2015-11-06 ENCOUNTER — Telehealth: Payer: Self-pay | Admitting: Internal Medicine

## 2015-11-06 DIAGNOSIS — L661 Lichen planopilaris: Secondary | ICD-10-CM

## 2015-11-06 NOTE — Telephone Encounter (Signed)
Please advise 

## 2015-11-06 NOTE — Telephone Encounter (Signed)
Patient is needing a referral to see a doctor at [facility] [phone]. She does not have a doctor at this time she knows she needs to see. Just needs to see one about lichen planopilaris. Please advise if patient needs an appointment. Thank you.

## 2015-11-06 NOTE — Telephone Encounter (Signed)
No appt needed - referral ordered

## 2015-11-06 NOTE — Telephone Encounter (Deleted)
Pre visit review using our clinic review tool, if applicable. No additional management support is needed unless otherwise documented below in the visit note. 

## 2015-11-07 NOTE — Telephone Encounter (Signed)
Spoke to pt. She was informed about the referral.

## 2015-11-09 ENCOUNTER — Other Ambulatory Visit: Payer: Self-pay | Admitting: Internal Medicine

## 2015-11-11 DIAGNOSIS — L661 Lichen planopilaris: Secondary | ICD-10-CM | POA: Diagnosis not present

## 2015-11-11 DIAGNOSIS — R300 Dysuria: Secondary | ICD-10-CM | POA: Diagnosis not present

## 2015-11-11 NOTE — Telephone Encounter (Signed)
RX faxed to [facility] 

## 2015-12-11 ENCOUNTER — Ambulatory Visit (INDEPENDENT_AMBULATORY_CARE_PROVIDER_SITE_OTHER): Payer: BLUE CROSS/BLUE SHIELD | Admitting: Internal Medicine

## 2015-12-11 ENCOUNTER — Encounter: Payer: Self-pay | Admitting: Internal Medicine

## 2015-12-11 ENCOUNTER — Other Ambulatory Visit (INDEPENDENT_AMBULATORY_CARE_PROVIDER_SITE_OTHER): Payer: BLUE CROSS/BLUE SHIELD

## 2015-12-11 VITALS — BP 124/66 | HR 56 | Temp 97.8°F | Resp 16 | Wt 139.0 lb

## 2015-12-11 DIAGNOSIS — E063 Autoimmune thyroiditis: Secondary | ICD-10-CM

## 2015-12-11 DIAGNOSIS — L661 Lichen planopilaris: Secondary | ICD-10-CM | POA: Diagnosis not present

## 2015-12-11 DIAGNOSIS — E559 Vitamin D deficiency, unspecified: Secondary | ICD-10-CM | POA: Diagnosis not present

## 2015-12-11 DIAGNOSIS — G4700 Insomnia, unspecified: Secondary | ICD-10-CM

## 2015-12-11 DIAGNOSIS — R5383 Other fatigue: Secondary | ICD-10-CM

## 2015-12-11 DIAGNOSIS — K219 Gastro-esophageal reflux disease without esophagitis: Secondary | ICD-10-CM

## 2015-12-11 LAB — TSH: TSH: 1.39 u[IU]/mL (ref 0.35–4.50)

## 2015-12-11 LAB — COMPREHENSIVE METABOLIC PANEL
ALK PHOS: 99 U/L (ref 39–117)
ALT: 14 U/L (ref 0–35)
AST: 16 U/L (ref 0–37)
Albumin: 4.5 g/dL (ref 3.5–5.2)
BUN: 13 mg/dL (ref 6–23)
CHLORIDE: 110 meq/L (ref 96–112)
CO2: 24 meq/L (ref 19–32)
Calcium: 9.7 mg/dL (ref 8.4–10.5)
Creatinine, Ser: 0.97 mg/dL (ref 0.40–1.20)
GFR: 78.66 mL/min (ref >60.00)
GLUCOSE: 99 mg/dL (ref 70–99)
POTASSIUM: 4.4 meq/L (ref 3.5–5.1)
Sodium: 139 mEq/L (ref 135–145)
Total Bilirubin: 0.5 mg/dL (ref 0.2–1.2)
Total Protein: 7.1 g/dL (ref 6.0–8.3)

## 2015-12-11 LAB — CBC WITH DIFFERENTIAL/PLATELET
BASOS PCT: 0.7 % (ref 0.0–3.0)
Basophils Absolute: 0 10*3/uL (ref 0.0–0.1)
EOS PCT: 4.4 % (ref 0.0–5.0)
Eosinophils Absolute: 0.3 10*3/uL (ref 0.0–0.7)
HCT: 36.9 % (ref 36.0–46.0)
Hemoglobin: 12.6 g/dL (ref 12.0–15.0)
LYMPHS ABS: 1.6 10*3/uL (ref 0.7–4.0)
Lymphocytes Relative: 25.5 % (ref 12.0–46.0)
MCHC: 34.3 g/dL (ref 30.0–36.0)
MCV: 87.8 fl (ref 78.0–100.0)
MONO ABS: 0.7 10*3/uL (ref 0.1–1.0)
MONOS PCT: 10.9 % (ref 3.0–12.0)
NEUTROS PCT: 58.5 % (ref 43.0–77.0)
Neutro Abs: 3.7 10*3/uL (ref 1.4–7.7)
Platelets: 193 10*3/uL (ref 150.0–400.0)
RBC: 4.2 Mil/uL (ref 3.87–5.11)
RDW: 14.8 % (ref 11.5–15.5)
WBC: 6.3 10*3/uL (ref 4.0–10.5)

## 2015-12-11 LAB — C-REACTIVE PROTEIN: CRP: <0.1 mg/dL — ABNORMAL LOW (ref 0.5–20.0)

## 2015-12-11 LAB — T3, FREE: T3 FREE: 2.6 pg/mL (ref 2.3–4.2)

## 2015-12-11 LAB — T4, FREE: Free T4: 0.72 ng/dL (ref 0.60–1.60)

## 2015-12-11 LAB — SEDIMENTATION RATE: Sed Rate: 1 mm/hr (ref 0–20)

## 2015-12-11 NOTE — Progress Notes (Signed)
Subjective:    Patient ID: Donna Blankenship, female    DOB: 1965/07/25, 50 y.o.   MRN: [id number]  HPI She is here for an acute visit.   She was doing well until she traveled to [place]. When she was she was bread and carbohydrates, which she typically does not eat. She felt like this affected her whole immune system and she has not felt well since.  Rash: She has lichen planopilaris on her scalp and this has been fairly controlled. She does note a new rash on her upper chest, neck and it is progressing on to her face. At first she thought this was eczema, but she wonders if it's actually the lichen planopilaris. She has seen dermatology for this in the past and is taking Plaquenil. She is about to establish with a new dermatologist in a couple of weeks. The rash is dry and irritated, but does not itch.  She did see rheumatology since she was here last and it was confirmed that she does not have lupus.  Difficulty sleeping: She takes Ambien rarely-once every other month. She tries to avoid taking medication and would prefer not to take it. She feels her difficulty sleeping may be related to the naltrexone. She has vivid dreams that started after she started the medication. She was started on this medication by Dr. Alessandra Bevels - the naturalist she was seeing. She can no longer afford to see her.  The medication is for her autoimmune issues.  Hashimoto's thyroiditis:  Dr Alessandra Bevels has been treating this. She is on thyroid supplements. Since she will no longer be able to afford seeing her she needs to establish with an endocrinologist.  Change in stool - her stool is green.  Her food does not look digested.  It is not formed stool.  This started after her trip to [place].    Her urine smells weird.  She denies frequency, dysuria.  She does not digest food well. She has been taking each HCL supplements to help with her digestion and if she feels it does help. She states she takes these because she does not  have enough acid in her stomach. Recently she has had some reflux symptoms.  It feels like the food just sits in her stomach.  Her whole body feels off.  She is very fatigued and is unsure why.  Medications and allergies reviewed with patient and updated if appropriate.  Patient Active Problem List   Diagnosis Date Noted  . Axillary lump 08/29/2015  . Dysuria 05/25/2015  . SOB (shortness of breath) 05/25/2015  . Paresthesia of both hands 05/25/2015  . Hashimoto's thyroiditis 02/27/2015  . Elevated lipoprotein(a) 02/27/2015  . Insomnia 02/27/2015  . Thyroid nodule 12/19/2014  . Eczema 12/19/2014  . Food allergy 12/19/2014  . Hypercalcemia 12/19/2014  . Discoid lupus 11/24/2014  . Lichen planopilaris 11/13/2014  . GAD (generalized anxiety disorder) 08/13/2014  . Dyspnea 04/20/2014  . Hyperglycemia 01/01/2014  . Pure hypercholesterolemia 06/16/2013  . PULMONARY FIBROSIS, POSTINFLAMMATORY 09/20/2007  . COLONIC POLYPS, HX OF 10/21/2006    Current Outpatient Prescriptions on File Prior to Visit  Medication Sig Dispense Refill  . diazepam (VALIUM) 5 MG tablet TAKE 1 TABLET EVERY 12 HOURS AS NEEDED FOR ANXIETY 30 tablet 0  . EPINEPHrine 0.3 mg/0.3 mL IJ SOAJ injection Inject 0.3 mLs (0.3 mg total) into the muscle once. 1 Device 3  . hydroxychloroquine (PLAQUENIL) 200 MG tablet     . naltrexone (DEPADE) 50 MG tablet  Take 1 tablet (50 mg total) by mouth daily.    Marland Kitchen. zolpidem (AMBIEN CR) 6.25 MG CR tablet TAKE 1 TABLET BY MOUTH AT BEDTIME AS NEEDED FOR SLEEP 30 tablet 0   No current facility-administered medications on file prior to visit.     Past Medical History:  Diagnosis Date  . Anemia    past H/o anemia  . Anxiety    on effexor and valium in the past  . Dyspnea    seeing pulm, hx lupus pneumonitis, mild pulm fibrosis  . Fibromyalgia 01/18/2008   Qualifier: Diagnosis of  By: Yetta BarreJones MD, Bernadene Bellhomas L.   Marland Kitchen. GERD 01/11/2008   Qualifier: Diagnosis of  By: Yetta BarreJones MD, Bernadene Bellhomas L.   .  Hyperglycemia    borderline  . Infertility, female   . Thyroid disease    past h/o hypothyroidism    Past Surgical History:  Procedure Laterality Date  . ABDOMINAL HYSTERECTOMY  2004   for fibroid; retained ovaries  . PELVIC LAPAROSCOPY  1999    Social History   Social History  . Marital status: Married    Spouse name: N/A  . Number of children: N/A  . Years of education: N/A   Social History Main Topics  . Smoking status: Never Smoker  . Smokeless tobacco: Not on file  . Alcohol use 1.0 oz/week    2 Standard drinks or equivalent per week     Comment: occ glass of wine  . Drug use: No  . Sexual activity: Yes    Partners: Male   Other Topics Concern  . Not on file   Social History Narrative   Work or School: not working      Home Situation: lives with husband      Spiritual Beliefs: Christian      Lifestyle: eats healthy, not exercising          Family History  Problem Relation Age of Onset  . Diabetes Mother   . Diabetes Father   . Stroke Father   . Anuerysm Father   . Sudden death Brother   . Heart attack Brother   . Diabetes Paternal Grandmother   . Breast cancer Maternal Grandmother     Review of Systems  Constitutional: Positive for fatigue. Negative for fever.  HENT: Negative for congestion, sinus pressure, sneezing and sore throat.   Respiratory: Negative for cough, shortness of breath and wheezing.   Cardiovascular: Negative for chest pain, palpitations and leg swelling.  Gastrointestinal: Positive for blood in stool (rare, spot on tissue). Negative for abdominal pain.  Genitourinary: Negative for dysuria and frequency.       Change in urine odor  Skin: Positive for rash.  Neurological: Negative for light-headedness and headaches.       Objective:   Vitals:   12/11/15 1603  BP: 124/66  Pulse: (!) 56  Resp: 16  Temp: 97.8 F (36.6 C)   Filed Weights   12/11/15 1603  Weight: 139 lb (63 kg)   Body mass index is 22.44 kg/m.    Physical Exam  Constitutional: She appears well-developed and well-nourished. No distress.  HENT:  Head: Normocephalic and atraumatic.  Eyes: Conjunctivae are normal.  Neck: Normal range of motion. Neck supple. No tracheal deviation present. No thyromegaly present.  Cardiovascular: Normal rate, regular rhythm and normal heart sounds.   No murmur heard. Pulmonary/Chest: Effort normal and breath sounds normal. No respiratory distress. She has no wheezes. She has no rales.  Musculoskeletal: Normal range of motion.  She exhibits no edema.  Lymphadenopathy:    She has no cervical adenopathy.  Skin: Skin is warm and dry. Rash (Skin colored rash on upper chest, neck and lower face, rash consists of several raised irregularly-shaped lesions, no blisters, no papules, no open lesions) noted. She is not diaphoretic.  Psychiatric: She has a normal mood and affect. Her behavior is normal. Judgment normal.          Assessment & Plan:   See Problem List for Assessment and Plan of chronic medical problems.]

## 2015-12-11 NOTE — Assessment & Plan Note (Signed)
States she was receiving vitamin D injections Will check vitamin D level

## 2015-12-11 NOTE — Progress Notes (Signed)
Pre visit review using our clinic review tool, if applicable. No additional management support is needed unless otherwise documented below in the visit note. 

## 2015-12-11 NOTE — Assessment & Plan Note (Signed)
Increased difficulty sleeping since starting naltrexone  - causes vivid dreams May need to take Ambien more in the next couple of weeks to help with sleep

## 2015-12-11 NOTE — Patient Instructions (Addendum)
  Test(s) ordered today. Your results will be released to MyChart (or called to you) after review, usually within 72hours after test completion. If any changes need to be made, you will be notified at that same time.   Medications reviewed and updated.  No changes recommended at this time.   A referral was ordered for GI for your stomach and Endocrine for your thyroid.

## 2015-12-11 NOTE — Assessment & Plan Note (Signed)
Can no longer afford to see Dr Alessandra BevelsVaughn Will refer to endocrine Check tfts

## 2015-12-11 NOTE — Assessment & Plan Note (Signed)
Her current symptoms are suggestive of GERD, but she feels she is not of acid in her stomach and is taking a supplement for that She also notes changes in her stools Will refer to GI for further evaluation.

## 2015-12-11 NOTE — Assessment & Plan Note (Signed)
Managed by derm Taking plaquenil Will establish with a new dermatologist the end of the month

## 2015-12-11 NOTE — Assessment & Plan Note (Signed)
Possibly multifactorial Will check thyroid function If she is not sleeping well but is probably the cause for her fatigue-may need to use the Ambien to reset her sleep cycle Check basic blood work

## 2015-12-12 ENCOUNTER — Telehealth: Payer: Self-pay | Admitting: Emergency Medicine

## 2015-12-12 ENCOUNTER — Encounter: Payer: Self-pay | Admitting: Internal Medicine

## 2015-12-12 DIAGNOSIS — L659 Nonscarring hair loss, unspecified: Secondary | ICD-10-CM | POA: Diagnosis not present

## 2015-12-12 DIAGNOSIS — L309 Dermatitis, unspecified: Secondary | ICD-10-CM | POA: Diagnosis not present

## 2015-12-12 LAB — VITAMIN D 25 HYDROXY (VIT D DEFICIENCY, FRACTURES): VITD: 36.72 ng/mL (ref 30.00–100.00)

## 2015-12-12 NOTE — Telephone Encounter (Signed)
Patient called and said to disregard this message she was able to get in with another one of her doctors. Thank you.

## 2015-12-12 NOTE — Telephone Encounter (Signed)
Pt called and stated she is still not feeling well and wants to know if she should come in and get a prednisone injection for her autoimmune system. Please advise thanks.

## 2015-12-12 NOTE — Telephone Encounter (Signed)
Please advise 

## 2015-12-20 ENCOUNTER — Other Ambulatory Visit (INDEPENDENT_AMBULATORY_CARE_PROVIDER_SITE_OTHER): Payer: BLUE CROSS/BLUE SHIELD

## 2015-12-20 ENCOUNTER — Telehealth: Payer: Self-pay | Admitting: Internal Medicine

## 2015-12-20 DIAGNOSIS — R300 Dysuria: Secondary | ICD-10-CM | POA: Diagnosis not present

## 2015-12-20 LAB — URINALYSIS, ROUTINE W REFLEX MICROSCOPIC
BILIRUBIN URINE: NEGATIVE
Hgb urine dipstick: NEGATIVE
KETONES UR: NEGATIVE
Nitrite: NEGATIVE
PH: 6 (ref 5.0–8.0)
RBC / HPF: NONE SEEN (ref 0–?)
Specific Gravity, Urine: 1.02 (ref 1.000–1.030)
TOTAL PROTEIN, URINE-UPE24: NEGATIVE
URINE GLUCOSE: NEGATIVE
UROBILINOGEN UA: 0.2 (ref 0.0–1.0)

## 2015-12-20 NOTE — Telephone Encounter (Signed)
Spoke with pt to inform, labs are entered.

## 2015-12-20 NOTE — Telephone Encounter (Signed)
Please advise 

## 2015-12-20 NOTE — Telephone Encounter (Signed)
yes

## 2015-12-20 NOTE — Telephone Encounter (Signed)
Patient states she believes she has a UTI.  She would like to know if she could go to the lab to leave specimen today.

## 2015-12-22 ENCOUNTER — Encounter: Payer: Self-pay | Admitting: Internal Medicine

## 2015-12-22 LAB — URINE CULTURE

## 2015-12-24 ENCOUNTER — Encounter: Payer: Self-pay | Admitting: Pediatrics

## 2015-12-24 ENCOUNTER — Ambulatory Visit (INDEPENDENT_AMBULATORY_CARE_PROVIDER_SITE_OTHER): Payer: BLUE CROSS/BLUE SHIELD | Admitting: Pediatrics

## 2015-12-24 VITALS — BP 112/70 | HR 88 | Temp 98.3°F | Resp 18 | Ht 66.0 in | Wt 136.0 lb

## 2015-12-24 DIAGNOSIS — R1319 Other dysphagia: Secondary | ICD-10-CM | POA: Insufficient documentation

## 2015-12-24 DIAGNOSIS — R1310 Dysphagia, unspecified: Secondary | ICD-10-CM

## 2015-12-24 DIAGNOSIS — T7800XD Anaphylactic reaction due to unspecified food, subsequent encounter: Secondary | ICD-10-CM

## 2015-12-24 DIAGNOSIS — R634 Abnormal weight loss: Secondary | ICD-10-CM | POA: Diagnosis not present

## 2015-12-24 DIAGNOSIS — T7800XA Anaphylactic reaction due to unspecified food, initial encounter: Secondary | ICD-10-CM | POA: Insufficient documentation

## 2015-12-24 DIAGNOSIS — L439 Lichen planus, unspecified: Secondary | ICD-10-CM | POA: Diagnosis not present

## 2015-12-24 DIAGNOSIS — K219 Gastro-esophageal reflux disease without esophagitis: Secondary | ICD-10-CM | POA: Diagnosis not present

## 2015-12-24 DIAGNOSIS — J3089 Other allergic rhinitis: Secondary | ICD-10-CM | POA: Diagnosis not present

## 2015-12-24 DIAGNOSIS — E739 Lactose intolerance, unspecified: Secondary | ICD-10-CM | POA: Insufficient documentation

## 2015-12-24 DIAGNOSIS — R140 Abdominal distension (gaseous): Secondary | ICD-10-CM | POA: Diagnosis not present

## 2015-12-24 DIAGNOSIS — K5904 Chronic idiopathic constipation: Secondary | ICD-10-CM | POA: Diagnosis not present

## 2015-12-24 MED ORDER — FLUTICASONE PROPIONATE 50 MCG/ACT NA SUSP
NASAL | 5 refills | Status: DC
Start: 2015-12-24 — End: 2016-04-22

## 2015-12-24 NOTE — Progress Notes (Signed)
[address] [place] [address] Dept: [phone]  New Patient Note  Patient ID: Donna Blankenship, female    DOB: 1965/04/24  Age: 50 y.o. MRN: [id number] Date of Office Visit: 12/24/2015 Referring provider: Pincus SanesStacy J Burns, MD [address] [place], [place] [address]    Chief Complaint: Allergy Testing and Allergies (multiple food allergies)  HPI Donna Blankenship presents for evaluation of food allergies and rhinitis. In 2007 she noted some itching of her mouth and throat after eating peanut and sesame and possibly other nuts Shellfish has given her severe abdominal pain. Milk products make her nauseous and  at times constipated. She was told not to eat eggs in March  2017 based on food sensitivity testing i.e. IgG to egg. She has been avoiding egg since the. She also has been avoiding Roh and gluten. She has had nasal congestion for several years aggravated by exposure to dust, cats, dogs, horses and the springtime. She has a history of eczema. She had lupus for 2 years about 20 years ago but Lupus is in remission. She has lichen planus. She also has had Hashimoto's thyroiditis. She denies heartburn but she has had difficulty swallowing for 2 weeks. She had allergy skin testing done about 9 years ago and has been avoiding shellfish, peanut, tree nuts, sesame and dairy since then.. She stopped eating eggs in the springtime after her food sensitivity test. This food sensitivity IgG test is nondiagnostic.  Review of Systems  Constitutional: Negative.   HENT:       Nasal congestion for several years  Eyes:       Itchy eyes at times  Respiratory:       History of Lupus pneumonitis in remission  Cardiovascular: Negative.   Gastrointestinal:       Occasional heartburn. Difficulty swallowing for 2 weeks  Genitourinary:       Partial hysterectomy  Musculoskeletal:       History of Lupus in remission. Fibromyalgia. Autoimmune disease  Skin:       Lichen planus of her scalp. History of  eczema but no longer a problem  Neurological: Negative.   Endo/Heme/Allergies:       No diabetes. Hashimoto's thyroiditis. Allergic reaction to several foods  Psychiatric/Behavioral: Negative.     Outpatient Encounter Prescriptions as of 12/24/2015  Medication Sig  . diazepam (VALIUM) 5 MG tablet TAKE 1 TABLET EVERY 12 HOURS AS NEEDED FOR ANXIETY  . EPINEPHrine 0.3 mg/0.3 mL IJ SOAJ injection Inject 0.3 mLs (0.3 mg total) into the muscle once.  . fexofenadine (ALLEGRA) 180 MG tablet Take 180 mg by mouth daily.  . hydroxychloroquine (PLAQUENIL) 200 MG tablet   . naltrexone (DEPADE) 50 MG tablet Take 1 tablet (50 mg total) by mouth daily.  . progesterone (PROMETRIUM) 100 MG capsule Take 100 mg by mouth daily.  Marland Kitchen. zolpidem (AMBIEN CR) 6.25 MG CR tablet TAKE 1 TABLET BY MOUTH AT BEDTIME AS NEEDED FOR SLEEP  . [DISCONTINUED] naltrexone (DEPADE) 50 MG tablet Take by mouth.  . fluticasone (FLONASE) 50 MCG/ACT nasal spray Use two sprays in each nostril once daily for stuffy nose or drainage   No facility-administered encounter medications on file as of 12/24/2015.      Drug Allergies:  Allergies  Allergen Reactions  . Eggs Or Egg-Derived Products   . Flax Seeds [Flaxseed (Linseed)]   . Gluten Meal   . Latex     Band aids and gloves  . Milk-Related Compounds   . Peanut-Containing  Drug Products     All tree nuts   . Sesame Seed Extract Allergy Skin Test     Itchy throat  . Shellfish Allergy   . Ebbs Bran   . Sulfamethoxazole Rash    REACTION: unspecified    Family History: Adriel's family history includes Anuerysm in her father; Breast cancer in her maternal grandmother; Diabetes in her father, mother, and paternal grandmother; Heart attack in her brother; Stroke in her father; Sudden death in her brother..Family history is positive for allergic rhinitis in her brother. Family history is negative for asthma, sinus problems, angioedema, eczema, hives, food allergies, lupus and celiac  disease.  Social and environmental. There are no pets in the home. She is not exposed to cigarette smoking. 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 Her symptoms are not worse at work. She has never smoked cigarettes.  Physical Exam: BP 112/70   Pulse 88   Temp 98.3 F (36.8 C) (Oral)   Resp 18   Ht 5\' 6"  (1.676 m)   Wt 136 lb (61.7 kg)   LMP 01/05/2002   SpO2 99%   BMI 21.95 kg/m    Physical Exam  Constitutional: She is oriented to person, place, and time. She appears well-developed and well-nourished.  HENT:  Eyes normal. Ears normal. Nose mild swelling of nasal  turbinates with clear nasal discharge. Pharynx normal.  Neck: Neck supple. No thyromegaly present.  Cardiovascular:  S1 and S2 normal no murmurs  Pulmonary/Chest:  Clear to percussion and auscultation  Abdominal: Soft. There is no tenderness (no hepatosplenomegaly).  Lymphadenopathy:    She has no cervical adenopathy.  Neurological: She is alert and oriented to person, place, and time.  Skin:  Clear  Psychiatric: She has a normal mood and affect. Her behavior is normal. Thought content normal.  Vitals reviewed.   Diagnostics: Allergy skin tests were extremely positive to dust mites, cat, dog, horse, cockroach, ragweed, molds. She had mild reactivity to grass pollens, weeds, and tree pollens. She had some reactivity to shrimp, crab, lobster and [place] nut   Assessment  Assessment and Plan: 1. Anaphylactic shock due to food, subsequent encounter   2. Other allergic rhinitis   3. Lichen planus   4. Lactose intolerance   5. Esophageal dysphagia     Meds ordered this encounter  Medications  . fluticasone (FLONASE) 50 MCG/ACT nasal spray    Sig: Use two sprays in each nostril once daily for stuffy nose or drainage    Dispense:  16 g    Refill:  5    Keep on file, patient will call when ready.    Patient Instructions  Environmental control of dust mite and mold Zyrtec 10 mg once a day for runny nose or  itching Fluticasone 2 sprays per nostril once a day for stuffy nose Opcon-A one drop 3 times a day if needed for itchy eyes I gave you information regarding lactose intolerance  If you have an allergic reaction take Benadryl 50 mg every 4 hours and if you have life-threatening symptoms inject  with EpiPen 0.3 mg Avoid tree nuts and shellfish. Also avoid sesame, peanuts, and milk until you hear from me regarding the results of your blood tests for food allergy  Please have Dr. Loreta AveMann send  me the results of your gastroenterology evaluation   Return in about 4 weeks (around 01/21/2016).   Thank you for the opportunity to care for this patient.  Please do not hesitate to contact me with questions.  Penne Lash, M.D.  [facility] [address] [place], [place] [address] [phone]

## 2015-12-24 NOTE — Addendum Note (Signed)
Addended by: Maryjean MornFREEMAN, Lucian Baswell D on: 12/24/2015 05:10 PM   Modules accepted: Orders

## 2015-12-24 NOTE — Patient Instructions (Addendum)
Environmental control of dust mite and mold Zyrtec 10 mg once a day for runny nose or itching Fluticasone 2 sprays per nostril once a day for stuffy nose Opcon-A one drop 3 times a day if needed for itchy eyes I gave you information regarding lactose intolerance  If you have an allergic reaction take Benadryl 50 mg every 4 hours and if you have life-threatening symptoms inject  with EpiPen 0.3 mg Avoid tree nuts and shellfish. Also avoid sesame, peanuts, and milk until you hear from me regarding the results of your blood tests for food allergy  Please have Dr. Loreta AveMann send  me the results of your gastroenterology evaluation

## 2015-12-26 DIAGNOSIS — L658 Other specified nonscarring hair loss: Secondary | ICD-10-CM | POA: Diagnosis not present

## 2016-01-03 DIAGNOSIS — T7800XD Anaphylactic reaction due to unspecified food, subsequent encounter: Secondary | ICD-10-CM | POA: Diagnosis not present

## 2016-01-14 ENCOUNTER — Encounter: Payer: Self-pay | Admitting: Pediatrics

## 2016-01-14 ENCOUNTER — Encounter: Payer: Self-pay | Admitting: Internal Medicine

## 2016-01-14 ENCOUNTER — Ambulatory Visit (INDEPENDENT_AMBULATORY_CARE_PROVIDER_SITE_OTHER): Payer: BLUE CROSS/BLUE SHIELD | Admitting: Internal Medicine

## 2016-01-14 VITALS — BP 110/70 | HR 94 | Ht 66.0 in | Wt 138.0 lb

## 2016-01-14 DIAGNOSIS — E063 Autoimmune thyroiditis: Secondary | ICD-10-CM

## 2016-01-14 DIAGNOSIS — Z8639 Personal history of other endocrine, nutritional and metabolic disease: Secondary | ICD-10-CM

## 2016-01-14 HISTORY — DX: Personal history of other endocrine, nutritional and metabolic disease: Z86.39

## 2016-01-14 NOTE — Progress Notes (Addendum)
Patient ID: Donna Blankenship, female   DOB: August 02, 1965, 51 y.o.   MRN: [id number]    HPI  Donna Blankenship is a 51 y.o.-year-old female, referred by her PCP, Dr.Burns, for management of Hashimoto's thyroiditis. She has been previously seen by Dr. Allena NapoleonElizabeth Vaughn, but cannot afford to go see her anymore.  Pt. has been dx with Hashimoto's thyroiditis hypothyroidism in ~2002.  She [facility] >> was feeling well on it >> she stopped as he ran out >> saw endocrinology >> started Synthroid DAW >> felt terrible on this (panic attacks, very emotional, went to ED 2-3x for CP - would not want to repeat) >> She then came off Synthroid >> TFTs were normal off meds afterwards.  I reviewed pt's thyroid tests: Lab Results  Component Value Date   TSH 1.39 12/11/2015   TSH 1.44 10/30/2014   TSH 1.30 06/17/2012   TSH 1.727 09/15/2011   TSH 0.09 (L) 01/18/2008   TSH 0.64 09/28/2007   FREET4 0.72 12/11/2015   FREET4 0.7 09/28/2007    Labs also reviewed per records from Dr. Alessandra BevelsVaughn: 08/14/2015: - TSH 2.00 (0.40-4.50) - rT3 18 (8-25) - TPO antibodies 151 (<9) - ATA antibodies 241 (<2)  01/28/2015: - TPO antibodies 339 (<9) - ATA antibodies 232 (<2)  Pt describes: - no weight gain - no fatigue - no cold intolerance, + hot flushes - entered menopause after her partial hysterectomy 5 years ago - no depression, no anxiety - + occasional constipation - no dry skin - + hair loss  Pt denies feeling nodules in neck, hoarseness, dysphagia/odynophagia, SOB with lying down.  She has a h/o small thyroid nodules by a thyroid U/S ~2010.  She started taking Selenium 100 mg 2-3x a day.  She has no FH of thyroid disorders. No FH of thyroid cancer. No h/o radiation tx to head or neck. No recent use of iodine supplements.  Pt. also has a history of SLE (dx'ed in her 5820s).She was on Prednisone for 1.5 years >> then was able to come off >> her SLE appeared to be  cured. In last 2 years, she developed lichen planus on scalp.  ROS: Constitutional: + see HPI, + nocturia, + poor sleep Eyes: no blurry vision, no xerophthalmia ENT: no sore throat, no nodules palpated in throat, no dysphagia/odynophagia, no hoarseness Cardiovascular: no CP/SOB/palpitations/leg swelling Respiratory: no cough/SOB Gastrointestinal: no N/V/D/C Musculoskeletal: no muscle/joint aches Skin: + rash - lichen planus scalp, + hair loss Neurological: no tremors/numbness/tingling/dizziness Psychiatric: no depression/+ occasional anxiety  Past Medical History:  Diagnosis Date  . Anemia    past H/o anemia  . Anxiety    on effexor and valium in the past  . Dyspnea    seeing pulm, hx lupus pneumonitis, mild pulm fibrosis  . Eczema   . Fibromyalgia 01/18/2008   Qualifier: Diagnosis of  By: Yetta BarreJones MD, Bernadene Bellhomas L.   Marland Kitchen. GERD 01/11/2008   Qualifier: Diagnosis of  By: Yetta BarreJones MD, Bernadene Bellhomas L.   . Hyperglycemia    borderline  . Infertility, female   . Thyroid disease    past h/o hypothyroidism   Past Surgical History:  Procedure Laterality Date  . ABDOMINAL HYSTERECTOMY  2004   for fibroid; retained ovaries  . PELVIC LAPAROSCOPY  1999   Social History   Social History  . Marital status: Married    Spouse name: N/A  . Number of children: 0   Occupational History  . N/a  Social History Main Topics  . Smoking status: Never Smoker  . Smokeless tobacco: Never Used  . Alcohol use No           . Drug use: No   Current Outpatient Prescriptions on File Prior to Visit  Medication Sig Dispense Refill  . diazepam (VALIUM) 5 MG tablet TAKE 1 TABLET EVERY 12 HOURS AS NEEDED FOR ANXIETY 30 tablet 0  . fexofenadine (ALLEGRA) 180 MG tablet Take 180 mg by mouth daily.    . hydroxychloroquine (PLAQUENIL) 200 MG tablet     . naltrexone (DEPADE) 50 MG tablet Take 1 tablet (50 mg total) by mouth daily.    Marland Kitchen EPINEPHrine 0.3 mg/0.3 mL IJ SOAJ injection Inject 0.3 mLs (0.3 mg total) into the  muscle once. (Patient not taking: Reported on 01/14/2016) 1 Device 3  . fluticasone (FLONASE) 50 MCG/ACT nasal spray Use two sprays in each nostril once daily for stuffy nose or drainage (Patient not taking: Reported on 01/14/2016) 16 g 5  . progesterone (PROMETRIUM) 100 MG capsule Take 100 mg by mouth daily.    Marland Kitchen zolpidem (AMBIEN CR) 6.25 MG CR tablet TAKE 1 TABLET BY MOUTH AT BEDTIME AS NEEDED FOR SLEEP (Patient not taking: Reported on 01/14/2016) 30 tablet 0   No current facility-administered medications on file prior to visit.    Allergies  Allergen Reactions  . Eggs Or Egg-Derived Products   . Flax Seeds [Flaxseed (Linseed)]   . Gluten Meal   . Latex     Band aids and gloves  . Milk-Related Compounds   . Peanut-Containing Drug Products     All tree nuts   . Sesame Seed Extract Allergy Skin Test     Itchy throat  . Shellfish Allergy   . Carbo Bran   . Sulfamethoxazole Rash    REACTION: unspecified   Family History  Problem Relation Age of Onset  . Diabetes Mother   . Diabetes Father   . Stroke Father   . Anuerysm Father   . Sudden death Brother   . Heart attack Brother   . Diabetes Paternal Grandmother   . Breast cancer Maternal Grandmother   . Asthma Neg Hx   . Allergic rhinitis Neg Hx   . Immunodeficiency Neg Hx   . Urticaria Neg Hx   . Eczema Neg Hx   . Atopy Neg Hx   . Angioedema Neg Hx    PE: BP 110/70 (BP Location: Left Arm, Patient Position: Sitting)   Pulse 94   Ht 5\' 6"  (1.676 m)   Wt 138 lb (62.6 kg)   LMP 01/05/2002   SpO2 98%   BMI 22.27 kg/m   Wt Readings from Last 3 Encounters:  01/14/16 138 lb (62.6 kg)  12/24/15 136 lb (61.7 kg)  12/11/15 139 lb (63 kg)   Constitutional: normal weight, in NAD Eyes: PERRLA, EOMI, no exophthalmos ENT: moist mucous membranes, no thyromegaly, no cervical lymphadenopathy Cardiovascular: RRR, No MRG Respiratory: CTA B Gastrointestinal: abdomen soft, NT, ND, BS+ Musculoskeletal: no deformities, strength intact in  all 4 Skin: moist, warm, no rashes Neurological: no tremor with outstretched hands, DTR normal in all 4  ASSESSMENT: 1. Hashimoto thyroiditis  2. H/o thyroid nodules  PLAN: 1. Hashimoto thyroiditis - we had a long discussion about her Hashimoto thyroiditis diagnosis. I explained that this is an autoimmune disorder, in which she develops antibodies against her own thyroid. The antibodies bind to the thyroid tissue and cause inflammation, and, eventually, destruction of the gland  and hypothyroidism. We don't know how long this process can be, it can last from months to years. As of now, based on the last results that I have, her thyroid tests are normal as checked last month. We will repeat them in 2 mo. I will also add thyroid antibody levels.  - We discussed about treatment for Hashimoto thyroiditis, which is actually limited to thyroid hormones in case her TFTs are abnormal. Supplements like selenium has been tried with various results, some showing improvement in the TPO antibodies. However, there are no randomized controlled trials of this are consistent results between trials. We also discussed about ways to improve her immune system (relaxation, diet, exercise, sleep) to reduce the Ab titer and, subsequently, the thyroid inflammation.  - We also discussed about iodine supplementation. She was wondering whether she needs to start this. As she is already taking a multivitamin which contains the daily recommended dose, I advised her not to take a supplement. - She would like to be proactive in decreasing her antibody titers so I suggested to take selenium 200 g daily. She started to take 100 g for gut health, but will increase to 2 tablets daily. We will repeat her antibodies in 2 months when she returns for TFTs.  2. History of thyroid nodules - Per thyroid ultrasound ~2010 - No neck compression symptoms - We will check a thyroid ultrasound now  Orders Placed This Encounter  Procedures  .  US SOFT TISSUE HEAD AND NECK  . T4, free  . T3, free  . TSH  . Thyroid peroxidase antibody  . Thyroglobulin antibody   US SOFT TISSUE HEAD AND NECK  Order: [id number]  Status:  Final result Visible to patient:  No (Not Released) Dx:  H/O thyroid nodule  Details   Reading Physician Reading Date Result Priority  Richarda Overlie, MD 01/16/2016   Narrative    CLINICAL DATA: History of thyroid nodules. Prior studies not available.  EXAM: THYROID ULTRASOUND  TECHNIQUE: Ultrasound examination of the thyroid gland and adjacent soft tissues was performed.  COMPARISON: None.  FINDINGS: Parenchymal Echotexture: Mildly heterogenous  Isthmus: Measures 0.1 cm in the AP dimension.  Right lobe: Measures 5.2 x 1.2 x 1.6 cm.  Left lobe: Measures 4.2 x 0.9 x 1.2 cm.  _________________________________________________________  Estimated total number of nodules >/= 1 cm: 0  Number of spongiform nodules >/= 2 cm not described below (TR1): 0  Number of mixed cystic and solid nodules >/= 1.5 cm not described below (TR2): 0  Nodule # 1:  Location: Right; Inferior  Maximum size: 0.5 cm; Other 2 dimensions: 0.3 x 0.4 cm  Composition: solid/almost completely solid (2)  Echogenicity: hypoechoic (2)  Shape: not taller-than-wide (0)  Margins: smooth (0)  Echogenic foci: none (0)  ACR TI-RADS total points: 4.  ACR TI-RADS risk category: TR4 (4-6 points).  ACR TI-RADS recommendations:  Given size (<0.9 cm) and appearance, this nodule does NOT meet TI-RADS criteria for biopsy or dedicated follow-up.  Small anechoic nodules in the inferior left thyroid lobe are suggestive for small cysts, largest measuring 0.2 cm.  IMPRESSION: Small thyroid nodules. These nodules do not meet criteria for biopsy or dedicated follow-up.  The above is in keeping with the ACR TI-RADS recommendations - J Am Coll Radiol 2017;14:587-595.   Electronically Signed By: Richarda Overlie M.D. On:  01/16/2016 16:59       Small thyroid nodules, no need for follow-up.  Addendum 03/18/16  Component     Latest  Ref Rng & Units 03/17/2016  TSH     0.35 - 4.50 uIU/mL 2.18  T4,Free(Direct)     0.60 - 1.60 ng/dL 1.61 (H)  Triiodothyronine,Free,Serum     2.3 - 4.2 pg/mL 7.2 (H)  Thyroperoxidase Ab SerPl-aCnc     <9 IU/mL 67 (H)  Thyroglobulin Ab     <2 IU/mL 177 (H)   Thyroid antibodies are improved. TSH is normal, however, free T4 and free T3 are normal slightly elevated. I would not intervene at this point, but would like to repeat her thyroid tests in 6 weeks.  Carlus Pavlov, MD PhD [facility] End[facility]

## 2016-01-14 NOTE — Patient Instructions (Signed)
Please take Selenium 200 mcg daily.  Come back for labs in 2 months.  Please come back for a follow-up appointment in 8 months.

## 2016-01-15 ENCOUNTER — Telehealth: Payer: Self-pay | Admitting: Allergy

## 2016-01-15 ENCOUNTER — Other Ambulatory Visit: Payer: BLUE CROSS/BLUE SHIELD

## 2016-01-15 NOTE — Telephone Encounter (Signed)
Faxed lab work to Dr. Cheryll CockayneStacy Burns and mailed copy to patient. Per Dr. Beaulah DinningBardelas

## 2016-01-16 ENCOUNTER — Ambulatory Visit
Admission: RE | Admit: 2016-01-16 | Discharge: 2016-01-16 | Disposition: A | Discharge status: Home / Self Care | Payer: BLUE CROSS/BLUE SHIELD | Source: Ambulatory Visit | Attending: Internal Medicine | Admitting: Internal Medicine

## 2016-01-16 DIAGNOSIS — E042 Nontoxic multinodular goiter: Secondary | ICD-10-CM | POA: Diagnosis not present

## 2016-01-16 NOTE — Progress Notes (Signed)
I was able to review the serum  IgE results with her and they have been inserted into the chart. I want her to avoid shellfish, sesame and tree nuts for now. She had a positive skin test to [place] nut. We could do a gradual oral challenge to other nuts.. She had very positive serum IgE to shellfish and mild reactivity to sesame. The  following serum IgE testing was negative-milk proteins and casein, tree nuts, egg, peanut. She may add milk, egg and peanut into her diet.Marland Kitchen. She will keep her previously scheduled follow-up appointment

## 2016-01-20 ENCOUNTER — Ambulatory Visit
Admission: RE | Admit: 2016-01-20 | Discharge: 2016-01-20 | Disposition: A | Discharge status: Home / Self Care | Payer: BLUE CROSS/BLUE SHIELD | Source: Ambulatory Visit | Attending: Internal Medicine | Admitting: Internal Medicine

## 2016-01-20 DIAGNOSIS — Z1231 Encounter for screening mammogram for malignant neoplasm of breast: Secondary | ICD-10-CM

## 2016-01-21 ENCOUNTER — Ambulatory Visit: Payer: Self-pay | Admitting: Allergy and Immunology

## 2016-01-21 DIAGNOSIS — R21 Rash and other nonspecific skin eruption: Secondary | ICD-10-CM | POA: Diagnosis not present

## 2016-02-06 DIAGNOSIS — L659 Nonscarring hair loss, unspecified: Secondary | ICD-10-CM | POA: Diagnosis not present

## 2016-03-02 ENCOUNTER — Telehealth: Payer: Self-pay | Admitting: *Deleted

## 2016-03-02 NOTE — Telephone Encounter (Signed)
Left msg on triage requesting refill on her diazepam.../lmb

## 2016-03-02 NOTE — Telephone Encounter (Signed)
[place] controlled substance database checked.  Ok to fill medication.  

## 2016-03-03 DIAGNOSIS — K5904 Chronic idiopathic constipation: Secondary | ICD-10-CM | POA: Diagnosis not present

## 2016-03-03 DIAGNOSIS — Z800 Family history of malignant neoplasm of digestive organs: Secondary | ICD-10-CM | POA: Diagnosis not present

## 2016-03-03 MED ORDER — DIAZEPAM 5 MG PO TABS: ORAL_TABLET | ORAL | 0 refills | Status: DC

## 2016-03-03 NOTE — Telephone Encounter (Signed)
Tried calling pt to inform refill has been called into [facility], but # has been disconnected...Raechel Chute/lmb

## 2016-03-04 ENCOUNTER — Ambulatory Visit: Payer: BLUE CROSS/BLUE SHIELD | Admitting: Internal Medicine

## 2016-03-04 NOTE — Progress Notes (Deleted)
Subjective:    Patient ID: Donna Blankenship, female    DOB: 04-08-1965, 51 y.o.   MRN: [id number]  HPI The patient is here for follow up.    Medications and allergies reviewed with patient and updated if appropriate.  Patient Active Problem List   Diagnosis Date Noted  . H/O thyroid nodule 01/14/2016  . Anaphylactic shock due to adverse food reaction 12/24/2015  . Other allergic rhinitis 12/24/2015  . Lactose intolerance 12/24/2015  . Esophageal dysphagia 12/24/2015  . Vitamin D deficiency 12/11/2015  . Fatigue 12/11/2015  . Gastroesophageal reflux disease 12/11/2015  . Axillary lump 08/29/2015  . Paresthesia of both hands 05/25/2015  . Hashimoto's thyroiditis 02/27/2015  . Elevated lipoprotein(a) 02/27/2015  . Insomnia 02/27/2015  . Thyroid nodule 12/19/2014  . Eczema 12/19/2014  . Food allergy 12/19/2014  . Hypercalcemia 12/19/2014  . Lichen planus 11/13/2014  . GAD (generalized anxiety disorder) 08/13/2014  . Dyspnea 04/20/2014  . Hyperglycemia 01/01/2014  . Pure hypercholesterolemia 06/16/2013  . PULMONARY FIBROSIS, POSTINFLAMMATORY 09/20/2007  . COLONIC POLYPS, HX OF 10/21/2006    Current Outpatient Prescriptions on File Prior to Visit  Medication Sig Dispense Refill  . diazepam (VALIUM) 5 MG tablet TAKE 1 TABLET EVERY 12 HOURS AS NEEDED FOR ANXIETY 30 tablet 0  . EPINEPHrine 0.3 mg/0.3 mL IJ SOAJ injection Inject 0.3 mLs (0.3 mg total) into the muscle once. (Patient not taking: Reported on 01/14/2016) 1 Device 3  . fexofenadine (ALLEGRA) 180 MG tablet Take 180 mg by mouth daily.    . fluconazole (DIFLUCAN) 200 MG tablet     . fluticasone (FLONASE) 50 MCG/ACT nasal spray Use two sprays in each nostril once daily for stuffy nose or drainage (Patient not taking: Reported on 01/14/2016) 16 g 5  . hydroxychloroquine (PLAQUENIL) 200 MG tablet     . LINZESS 290 MCG CAPS capsule as needed.    . naltrexone (DEPADE) 50 MG tablet Take 1 tablet (50 mg total) by mouth daily.      . progesterone (PROMETRIUM) 100 MG capsule Take 100 mg by mouth daily.    Marland Kitchen. zolpidem (AMBIEN CR) 6.25 MG CR tablet TAKE 1 TABLET BY MOUTH AT BEDTIME AS NEEDED FOR SLEEP (Patient not taking: Reported on 01/14/2016) 30 tablet 0   No current facility-administered medications on file prior to visit.     Past Medical History:  Diagnosis Date  . Anemia    past H/o anemia  . Anxiety    on effexor and valium in the past  . Dyspnea    seeing pulm, hx lupus pneumonitis, mild pulm fibrosis  . Eczema   . Fibromyalgia 01/18/2008   Qualifier: Diagnosis of  By: Yetta BarreJones MD, Bernadene Bellhomas L.   Marland Kitchen. GERD 01/11/2008   Qualifier: Diagnosis of  By: Yetta BarreJones MD, Bernadene Bellhomas L.   . Hyperglycemia    borderline  . Infertility, female   . Thyroid disease    past h/o hypothyroidism    Past Surgical History:  Procedure Laterality Date  . ABDOMINAL HYSTERECTOMY  2004   for fibroid; retained ovaries  . PELVIC LAPAROSCOPY  1999    Social History   Social History  . Marital status: Married    Spouse name: N/A  . Number of children: N/A  . Years of education: N/A   Social History Main Topics  . Smoking status: Never Smoker  . Smokeless tobacco: Never Used  . Alcohol use 1.0 oz/week    2 Standard drinks or equivalent per week  Comment: occ glass of wine  . Drug use: No  . Sexual activity: Yes    Partners: Male   Other Topics Concern  . Not on file   Social History Narrative   Work or School: not working      Home Situation: lives with husband      Spiritual Beliefs: Christian      Lifestyle: eats healthy, not exercising          Family History  Problem Relation Age of Onset  . Diabetes Mother   . Diabetes Father   . Stroke Father   . Anuerysm Father   . Sudden death Brother   . Heart attack Brother   . Diabetes Paternal Grandmother   . Breast cancer Maternal Grandmother   . Asthma Neg Hx   . Allergic rhinitis Neg Hx   . Immunodeficiency Neg Hx   . Urticaria Neg Hx   . Eczema Neg Hx   .  Atopy Neg Hx   . Angioedema Neg Hx     Review of Systems     Objective:  There were no vitals filed for this visit. Wt Readings from Last 3 Encounters:  01/14/16 138 lb (62.6 kg)  12/24/15 136 lb (61.7 kg)  12/11/15 139 lb (63 kg)   There is no height or weight on file to calculate BMI.   Physical Exam    Constitutional: Appears well-developed and well-nourished. No distress.  HENT:  Head: Normocephalic and atraumatic.  Neck: Neck supple. No tracheal deviation present. No thyromegaly present.  No cervical lymphadenopathy Cardiovascular: Normal rate, regular rhythm and normal heart sounds.   No murmur heard. No carotid bruit .  No edema Pulmonary/Chest: Effort normal and breath sounds normal. No respiratory distress. No has no wheezes. No rales.  Skin: Skin is warm and dry. Not diaphoretic.  Psychiatric: Normal mood and affect. Behavior is normal.      Assessment & Plan:    See Problem List for Assessment and Plan of chronic medical problems.

## 2016-03-09 ENCOUNTER — Telehealth: Payer: Self-pay | Admitting: Internal Medicine

## 2016-03-09 ENCOUNTER — Telehealth: Payer: Self-pay

## 2016-03-09 NOTE — Telephone Encounter (Signed)
Patient called back, stated she needed to know when to come for lab work, I scheduled patient and she understood, no other questions at this time.

## 2016-03-09 NOTE — Telephone Encounter (Signed)
Called patient and LVM advising of the question she has regarding her appointments, I advised she may need to have labs drawn first and then see Dr.Gherghe for her appointment via her previous OV note, left call back number.

## 2016-03-09 NOTE — Telephone Encounter (Signed)
Patient would like to know what do she need the appointment in September. And just come in to come in and get labs drawn. Please advise

## 2016-03-11 ENCOUNTER — Ambulatory Visit: Payer: BLUE CROSS/BLUE SHIELD | Admitting: Internal Medicine

## 2016-03-16 ENCOUNTER — Other Ambulatory Visit: Payer: BLUE CROSS/BLUE SHIELD

## 2016-03-17 ENCOUNTER — Other Ambulatory Visit (INDEPENDENT_AMBULATORY_CARE_PROVIDER_SITE_OTHER): Payer: BLUE CROSS/BLUE SHIELD

## 2016-03-17 DIAGNOSIS — E063 Autoimmune thyroiditis: Secondary | ICD-10-CM | POA: Diagnosis not present

## 2016-03-17 LAB — TSH: TSH: 2.18 u[IU]/mL (ref 0.35–4.50)

## 2016-03-17 LAB — T3, FREE: T3, Free: 7.2 pg/mL — ABNORMAL HIGH (ref 2.3–4.2)

## 2016-03-17 LAB — T4, FREE: Free T4: 1.75 ng/dL — ABNORMAL HIGH (ref 0.60–1.60)

## 2016-03-18 LAB — THYROGLOBULIN ANTIBODY: Thyroglobulin Ab: 177 IU/mL — ABNORMAL HIGH (ref <2)

## 2016-03-18 LAB — THYROID PEROXIDASE ANTIBODY: Thyroperoxidase Ab SerPl-aCnc: 67 IU/mL — ABNORMAL HIGH (ref <9)

## 2016-03-18 NOTE — Addendum Note (Signed)
Addended by: Carlus PavlovGHERGHE, Tanairy Payeur on: 03/18/2016 12:58 PM   Modules accepted: Orders

## 2016-03-22 NOTE — Progress Notes (Deleted)
Subjective:    Patient ID: Donna Blankenship, female    DOB: 04-08-1965, 51 y.o.   MRN: [id number]  HPI The patient is here for follow up.    Medications and allergies reviewed with patient and updated if appropriate.  Patient Active Problem List   Diagnosis Date Noted  . H/O thyroid nodule 01/14/2016  . Anaphylactic shock due to adverse food reaction 12/24/2015  . Other allergic rhinitis 12/24/2015  . Lactose intolerance 12/24/2015  . Esophageal dysphagia 12/24/2015  . Vitamin D deficiency 12/11/2015  . Fatigue 12/11/2015  . Gastroesophageal reflux disease 12/11/2015  . Axillary lump 08/29/2015  . Paresthesia of both hands 05/25/2015  . Hashimoto's thyroiditis 02/27/2015  . Elevated lipoprotein(a) 02/27/2015  . Insomnia 02/27/2015  . Thyroid nodule 12/19/2014  . Eczema 12/19/2014  . Food allergy 12/19/2014  . Hypercalcemia 12/19/2014  . Lichen planus 11/13/2014  . GAD (generalized anxiety disorder) 08/13/2014  . Dyspnea 04/20/2014  . Hyperglycemia 01/01/2014  . Pure hypercholesterolemia 06/16/2013  . PULMONARY FIBROSIS, POSTINFLAMMATORY 09/20/2007  . COLONIC POLYPS, HX OF 10/21/2006    Current Outpatient Prescriptions on File Prior to Visit  Medication Sig Dispense Refill  . diazepam (VALIUM) 5 MG tablet TAKE 1 TABLET EVERY 12 HOURS AS NEEDED FOR ANXIETY 30 tablet 0  . EPINEPHrine 0.3 mg/0.3 mL IJ SOAJ injection Inject 0.3 mLs (0.3 mg total) into the muscle once. (Patient not taking: Reported on 01/14/2016) 1 Device 3  . fexofenadine (ALLEGRA) 180 MG tablet Take 180 mg by mouth daily.    . fluconazole (DIFLUCAN) 200 MG tablet     . fluticasone (FLONASE) 50 MCG/ACT nasal spray Use two sprays in each nostril once daily for stuffy nose or drainage (Patient not taking: Reported on 01/14/2016) 16 g 5  . hydroxychloroquine (PLAQUENIL) 200 MG tablet     . LINZESS 290 MCG CAPS capsule as needed.    . naltrexone (DEPADE) 50 MG tablet Take 1 tablet (50 mg total) by mouth daily.      . progesterone (PROMETRIUM) 100 MG capsule Take 100 mg by mouth daily.    Marland Kitchen. zolpidem (AMBIEN CR) 6.25 MG CR tablet TAKE 1 TABLET BY MOUTH AT BEDTIME AS NEEDED FOR SLEEP (Patient not taking: Reported on 01/14/2016) 30 tablet 0   No current facility-administered medications on file prior to visit.     Past Medical History:  Diagnosis Date  . Anemia    past H/o anemia  . Anxiety    on effexor and valium in the past  . Dyspnea    seeing pulm, hx lupus pneumonitis, mild pulm fibrosis  . Eczema   . Fibromyalgia 01/18/2008   Qualifier: Diagnosis of  By: Yetta BarreJones MD, Bernadene Bellhomas L.   Marland Kitchen. GERD 01/11/2008   Qualifier: Diagnosis of  By: Yetta BarreJones MD, Bernadene Bellhomas L.   . Hyperglycemia    borderline  . Infertility, female   . Thyroid disease    past h/o hypothyroidism    Past Surgical History:  Procedure Laterality Date  . ABDOMINAL HYSTERECTOMY  2004   for fibroid; retained ovaries  . PELVIC LAPAROSCOPY  1999    Social History   Social History  . Marital status: Married    Spouse name: N/A  . Number of children: N/A  . Years of education: N/A   Social History Main Topics  . Smoking status: Never Smoker  . Smokeless tobacco: Never Used  . Alcohol use 1.0 oz/week    2 Standard drinks or equivalent per week  Comment: occ glass of wine  . Drug use: No  . Sexual activity: Yes    Partners: Male   Other Topics Concern  . Not on file   Social History Narrative   Work or School: not working      Home Situation: lives with husband      Spiritual Beliefs: Christian      Lifestyle: eats healthy, not exercising          Family History  Problem Relation Age of Onset  . Diabetes Mother   . Diabetes Father   . Stroke Father   . Anuerysm Father   . Sudden death Brother   . Heart attack Brother   . Diabetes Paternal Grandmother   . Breast cancer Maternal Grandmother   . Asthma Neg Hx   . Allergic rhinitis Neg Hx   . Immunodeficiency Neg Hx   . Urticaria Neg Hx   . Eczema Neg Hx   .  Atopy Neg Hx   . Angioedema Neg Hx     Review of Systems     Objective:  There were no vitals filed for this visit. Wt Readings from Last 3 Encounters:  01/14/16 138 lb (62.6 kg)  12/24/15 136 lb (61.7 kg)  12/11/15 139 lb (63 kg)   There is no height or weight on file to calculate BMI.   Physical Exam    Constitutional: Appears well-developed and well-nourished. No distress.  HENT:  Head: Normocephalic and atraumatic.  Neck: Neck supple. No tracheal deviation present. No thyromegaly present.  No cervical lymphadenopathy Cardiovascular: Normal rate, regular rhythm and normal heart sounds.   No murmur heard. No carotid bruit .  No edema Pulmonary/Chest: Effort normal and breath sounds normal. No respiratory distress. No has no wheezes. No rales.  Skin: Skin is warm and dry. Not diaphoretic.  Psychiatric: Normal mood and affect. Behavior is normal.      Assessment & Plan:    See Problem List for Assessment and Plan of chronic medical problems.

## 2016-03-23 ENCOUNTER — Ambulatory Visit: Payer: BLUE CROSS/BLUE SHIELD | Admitting: Internal Medicine

## 2016-03-23 DIAGNOSIS — Z0289 Encounter for other administrative examinations: Secondary | ICD-10-CM

## 2016-04-22 ENCOUNTER — Ambulatory Visit (INDEPENDENT_AMBULATORY_CARE_PROVIDER_SITE_OTHER): Payer: BLUE CROSS/BLUE SHIELD | Admitting: Internal Medicine

## 2016-04-22 ENCOUNTER — Encounter: Payer: Self-pay | Admitting: Internal Medicine

## 2016-04-22 VITALS — BP 124/84 | HR 80 | Temp 98.1°F | Resp 16 | Wt 146.0 lb

## 2016-04-22 DIAGNOSIS — Z0000 Encounter for general adult medical examination without abnormal findings: Secondary | ICD-10-CM | POA: Diagnosis not present

## 2016-04-22 DIAGNOSIS — R739 Hyperglycemia, unspecified: Secondary | ICD-10-CM

## 2016-04-22 DIAGNOSIS — E7800 Pure hypercholesterolemia, unspecified: Secondary | ICD-10-CM

## 2016-04-22 DIAGNOSIS — E063 Autoimmune thyroiditis: Secondary | ICD-10-CM

## 2016-04-22 MED ORDER — DIAZEPAM 5 MG PO TABS: ORAL_TABLET | ORAL | 0 refills | Status: DC

## 2016-04-22 MED ORDER — ZOLPIDEM TARTRATE ER 6.25 MG PO TBCR: 6.2500 mg | EXTENDED_RELEASE_TABLET | Freq: Every evening | ORAL | 0 refills | Status: DC | PRN

## 2016-04-22 NOTE — Patient Instructions (Signed)
Test(s) ordered today. Your results will be released to MyChart (or called to you) after review, usually within 72hours after test completion. If any changes need to be made, you will be notified at that same time.  All other Health Maintenance issues reviewed.   All recommended immunizations and age-appropriate screenings are up-to-date or discussed.  No immunizations administered today.   Medications reviewed and updated.  No changes recommended at this time.     Health Maintenance, Female Adopting a healthy lifestyle and getting preventive care can go a long way to promote health and wellness. Talk with your health care provider about what schedule of regular examinations is right for you. This is a good chance for you to check in with your provider about disease prevention and staying healthy. In between checkups, there are plenty of things you can do on your own. Experts have done a lot of research about which lifestyle changes and preventive measures are most likely to keep you healthy. Ask your health care provider for more information. Weight and diet Eat a healthy diet  Be sure to include plenty of vegetables, fruits, low-fat dairy products, and lean protein.  Do not eat a lot of foods high in solid fats, added sugars, or salt.  Get regular exercise. This is one of the most important things you can do for your health.  Most adults should exercise for at least 150 minutes each week. The exercise should increase your heart rate and make you sweat (moderate-intensity exercise).  Most adults should also do strengthening exercises at least twice a week. This is in addition to the moderate-intensity exercise. Maintain a healthy weight  Body mass index (BMI) is a measurement that can be used to identify possible weight problems. It estimates body fat based on height and weight. Your health care provider can help determine your BMI and help you achieve or maintain a healthy  weight.  For females 20 years of age and older:  A BMI below 18.5 is considered underweight.  A BMI of 18.5 to 24.9 is normal.  A BMI of 25 to 29.9 is considered overweight.  A BMI of 30 and above is considered obese. Watch levels of cholesterol and blood lipids  You should start having your blood tested for lipids and cholesterol at 49 years of age, then have this test every 5 years.  You may need to have your cholesterol levels checked more often if:  Your lipid or cholesterol levels are high.  You are older than 50 years of age.  You are at high risk for heart disease. Cancer screening Lung Cancer  Lung cancer screening is recommended for adults 55-80 years old who are at high risk for lung cancer because of a history of smoking.  A yearly low-dose CT scan of the lungs is recommended for people who:  Currently smoke.  Have quit within the past 15 years.  Have at least a 30-pack-year history of smoking. A pack year is smoking an average of one pack of cigarettes a day for 1 year.  Yearly screening should continue until it has been 15 years since you quit.  Yearly screening should stop if you develop a health problem that would prevent you from having lung cancer treatment. Breast Cancer  Practice breast self-awareness. This means understanding how your breasts normally appear and feel.  It also means doing regular breast self-exams. Let your health care provider know about any changes, no matter how small.  If you are in   your 20s or 30s, you should have a clinical breast exam (CBE) by a health care provider every 1-3 years as part of a regular health exam.  If you are 40 or older, have a CBE every year. Also consider having a breast X-ray (mammogram) every year.  If you have a family history of breast cancer, talk to your health care provider about genetic screening.  If you are at high risk for breast cancer, talk to your health care provider about having an MRI  and a mammogram every year.  Breast cancer gene (BRCA) assessment is recommended for women who have family members with BRCA-related cancers. BRCA-related cancers include:  Breast.  Ovarian.  Tubal.  Peritoneal cancers.  Results of the assessment will determine the need for genetic counseling and BRCA1 and BRCA2 testing. Cervical Cancer  Your health care provider may recommend that you be screened regularly for cancer of the pelvic organs (ovaries, uterus, and vagina). This screening involves a pelvic examination, including checking for microscopic changes to the surface of your cervix (Pap test). You may be encouraged to have this screening done every 3 years, beginning at age 21.  For women ages 30-65, health care providers may recommend pelvic exams and Pap testing every 3 years, or they may recommend the Pap and pelvic exam, combined with testing for human papilloma virus (HPV), every 5 years. Some types of HPV increase your risk of cervical cancer. Testing for HPV may also be done on women of any age with unclear Pap test results.  Other health care providers may not recommend any screening for nonpregnant women who are considered low risk for pelvic cancer and who do not have symptoms. Ask your health care provider if a screening pelvic exam is right for you.  If you have had past treatment for cervical cancer or a condition that could lead to cancer, you need Pap tests and screening for cancer for at least 20 years after your treatment. If Pap tests have been discontinued, your risk factors (such as having a new sexual partner) need to be reassessed to determine if screening should resume. Some women have medical problems that increase the chance of getting cervical cancer. In these cases, your health care provider may recommend more frequent screening and Pap tests. Colorectal Cancer  This type of cancer can be detected and often prevented.  Routine colorectal cancer screening  usually begins at 50 years of age and continues through 49 years of age.  Your health care provider may recommend screening at an earlier age if you have risk factors for colon cancer.  Your health care provider may also recommend using home test kits to check for hidden blood in the stool.  A small camera at the end of a tube can be used to examine your colon directly (sigmoidoscopy or colonoscopy). This is done to check for the earliest forms of colorectal cancer.  Routine screening usually begins at age 50.  Direct examination of the colon should be repeated every 5-10 years through 49 years of age. However, you may need to be screened more often if early forms of precancerous polyps or small growths are found. Skin Cancer  Check your skin from head to toe regularly.  Tell your health care provider about any new moles or changes in moles, especially if there is a change in a mole's shape or color.  Also tell your health care provider if you have a mole that is larger than the size of a   pencil eraser.  Always use sunscreen. Apply sunscreen liberally and repeatedly throughout the day.  Protect yourself by wearing long sleeves, pants, a wide-brimmed hat, and sunglasses whenever you are outside. Heart disease, diabetes, and high blood pressure  High blood pressure causes heart disease and increases the risk of stroke. High blood pressure is more likely to develop in:  People who have blood pressure in the high end of the normal range (130-139/85-89 mm Hg).  People who are overweight or obese.  People who are African American.  If you are 18-39 years of age, have your blood pressure checked every 3-5 years. If you are 40 years of age or older, have your blood pressure checked every year. You should have your blood pressure measured twice-once when you are at a hospital or clinic, and once when you are not at a hospital or clinic. Record the average of the two measurements. To check your  blood pressure when you are not at a hospital or clinic, you can use:  An automated blood pressure machine at a pharmacy.  A home blood pressure monitor.  If you are between 55 years and 79 years old, ask your health care provider if you should take aspirin to prevent strokes.  Have regular diabetes screenings. This involves taking a blood sample to check your fasting blood sugar level.  If you are at a normal weight and have a low risk for diabetes, have this test once every three years after 49 years of age.  If you are overweight and have a high risk for diabetes, consider being tested at a younger age or more often. Preventing infection Hepatitis B  If you have a higher risk for hepatitis B, you should be screened for this virus. You are considered at high risk for hepatitis B if:  You were born in a country where hepatitis B is common. Ask your health care provider which countries are considered high risk.  Your parents were born in a high-risk country, and you have not been immunized against hepatitis B (hepatitis B vaccine).  You have HIV or AIDS.  You use needles to inject street drugs.  You live with someone who has hepatitis B.  You have had sex with someone who has hepatitis B.  You get hemodialysis treatment.  You take certain medicines for conditions, including cancer, organ transplantation, and autoimmune conditions. Hepatitis C  Blood testing is recommended for:  Everyone born from 1945 through 1965.  Anyone with known risk factors for hepatitis C. Sexually transmitted infections (STIs)  You should be screened for sexually transmitted infections (STIs) including gonorrhea and chlamydia if:  You are sexually active and are younger than 49 years of age.  You are older than 49 years of age and your health care provider tells you that you are at risk for this type of infection.  Your sexual activity has changed since you were last screened and you are at an  increased risk for chlamydia or gonorrhea. Ask your health care provider if you are at risk.  If you do not have HIV, but are at risk, it may be recommended that you take a prescription medicine daily to prevent HIV infection. This is called pre-exposure prophylaxis (PrEP). You are considered at risk if:  You are sexually active and do not regularly use condoms or know the HIV status of your partner(s).  You take drugs by injection.  You are sexually active with a partner who has HIV. Talk with your   health care provider about whether you are at high risk of being infected with HIV. If you choose to begin PrEP, you should first be tested for HIV. You should then be tested every 3 months for as long as you are taking PrEP. Pregnancy  If you are premenopausal and you may become pregnant, ask your health care provider about preconception counseling.  If you may become pregnant, take 400 to 800 micrograms (mcg) of folic acid every day.  If you want to prevent pregnancy, talk to your health care provider about birth control (contraception). Osteoporosis and menopause  Osteoporosis is a disease in which the bones lose minerals and strength with aging. This can result in serious bone fractures. Your risk for osteoporosis can be identified using a bone density scan.  If you are 65 years of age or older, or if you are at risk for osteoporosis and fractures, ask your health care provider if you should be screened.  Ask your health care provider whether you should take a calcium or vitamin D supplement to lower your risk for osteoporosis.  Menopause may have certain physical symptoms and risks.  Hormone replacement therapy may reduce some of these symptoms and risks. Talk to your health care provider about whether hormone replacement therapy is right for you. Follow these instructions at home:  Schedule regular health, dental, and eye exams.  Stay current with your immunizations.  Do not use  any tobacco products including cigarettes, chewing tobacco, or electronic cigarettes.  If you are pregnant, do not drink alcohol.  If you are breastfeeding, limit how much and how often you drink alcohol.  Limit alcohol intake to no more than 1 drink per day for nonpregnant women. One drink equals 12 ounces of beer, 5 ounces of wine, or 1 ounces of hard liquor.  Do not use street drugs.  Do not share needles.  Ask your health care provider for help if you need support or information about quitting drugs.  Tell your health care provider if you often feel depressed.  Tell your health care provider if you have ever been abused or do not feel safe at home. This information is not intended to replace advice given to you by your health care provider. Make sure you discuss any questions you have with your health care provider. Document Released: 07/07/2010 Document Revised: 05/30/2015 Document Reviewed: 09/25/2014 Elsevier Interactive Patient Education  2017 [employer]  

## 2016-04-22 NOTE — Assessment & Plan Note (Signed)
Not on any medication or on any supplements for cholesterol Check lipid, cmp

## 2016-04-22 NOTE — Assessment & Plan Note (Signed)
Check a1c 

## 2016-04-22 NOTE — Assessment & Plan Note (Signed)
Following with endocrine

## 2016-04-22 NOTE — Assessment & Plan Note (Signed)
cmp

## 2016-04-22 NOTE — Progress Notes (Signed)
Subjective:    Patient ID: Donna Blankenship, female    DOB: 11/05/1965, 51 y.o.   MRN: [id number]  HPI She is here for a physical exam.   She has no concerns - she just wants her blood work checked.     Medications and allergies reviewed with patient and updated if appropriate.  Patient Active Problem List   Diagnosis Date Noted  . H/O thyroid nodule 01/14/2016  . Anaphylactic shock due to adverse food reaction 12/24/2015  . Other allergic rhinitis 12/24/2015  . Lactose intolerance 12/24/2015  . Esophageal dysphagia 12/24/2015  . Vitamin D deficiency 12/11/2015  . Fatigue 12/11/2015  . Gastroesophageal reflux disease 12/11/2015  . Axillary lump 08/29/2015  . Paresthesia of both hands 05/25/2015  . Hashimoto's thyroiditis 02/27/2015  . Elevated lipoprotein(a) 02/27/2015  . Insomnia 02/27/2015  . Thyroid nodule 12/19/2014  . Eczema 12/19/2014  . Food allergy 12/19/2014  . Hypercalcemia 12/19/2014  . Lichen planus 11/13/2014  . GAD (generalized anxiety disorder) 08/13/2014  . Dyspnea 04/20/2014  . Hyperglycemia 01/01/2014  . Pure hypercholesterolemia 06/16/2013  . PULMONARY FIBROSIS, POSTINFLAMMATORY 09/20/2007  . COLONIC POLYPS, HX OF 10/21/2006    Current Outpatient Prescriptions on File Prior to Visit  Medication Sig Dispense Refill  . EPINEPHrine 0.3 mg/0.3 mL IJ SOAJ injection Inject 0.3 mLs (0.3 mg total) into the muscle once. 1 Device 3  . fexofenadine (ALLEGRA) 180 MG tablet Take 180 mg by mouth daily.    . hydroxychloroquine (PLAQUENIL) 200 MG tablet     . LINZESS 290 MCG CAPS capsule as needed.    . naltrexone (DEPADE) 50 MG tablet Take 1 tablet (50 mg total) by mouth daily.    . progesterone (PROMETRIUM) 100 MG capsule Take 100 mg by mouth daily.     No current facility-administered medications on file prior to visit.     Past Medical History:  Diagnosis Date  . Anemia    past H/o anemia  . Anxiety    on effexor and valium in the past  . Dyspnea    seeing pulm, hx lupus pneumonitis, mild pulm fibrosis  . Eczema   . Fibromyalgia 01/18/2008   Qualifier: Diagnosis of  By: Yetta Barre MD, Bernadene Bell.   Marland Kitchen GERD 01/11/2008   Qualifier: Diagnosis of  By: Yetta Barre MD, Bernadene Bell.   . Hyperglycemia    borderline  . Infertility, female   . Thyroid disease    past h/o hypothyroidism    Past Surgical History:  Procedure Laterality Date  . ABDOMINAL HYSTERECTOMY  2004   for fibroid; retained ovaries  . PELVIC LAPAROSCOPY  1999    Social History   Social History  . Marital status: Married    Spouse name: N/A  . Number of children: N/A  . Years of education: N/A   Social History Main Topics  . Smoking status: Never Smoker  . Smokeless tobacco: Never Used  . Alcohol use 1.0 oz/week    2 Standard drinks or equivalent per week     Comment: occ glass of wine  . Drug use: No  . Sexual activity: Yes    Partners: Male   Other Topics Concern  . Not on file   Social History Narrative   Work or School: not working      Home Situation: lives with husband      Spiritual Beliefs: Christian      Lifestyle: eats healthy, not exercising          Family  History  Problem Relation Age of Onset  . Diabetes Mother   . Diabetes Father   . Stroke Father   . Anuerysm Father   . Sudden death Brother   . Heart attack Brother   . Diabetes Paternal Grandmother   . Breast cancer Maternal Grandmother   . Asthma Neg Hx   . Allergic rhinitis Neg Hx   . Immunodeficiency Neg Hx   . Urticaria Neg Hx   . Eczema Neg Hx   . Atopy Neg Hx   . Angioedema Neg Hx     Review of Systems  Constitutional: Positive for fatigue (from not sleeping). Negative for chills and fever.  Eyes: Negative for visual disturbance.  Respiratory: Negative for cough, shortness of breath and wheezing.   Cardiovascular: Negative for chest pain, palpitations and leg swelling.  Gastrointestinal: Positive for constipation (linzess as needed). Negative for abdominal pain, blood in  stool, diarrhea and nausea.       No gerd  Genitourinary: Negative for dysuria and hematuria.  Musculoskeletal: Positive for back pain (lower back). Negative for arthralgias.  Skin: Positive for color change (hyperpigmentation).  Neurological: Negative for light-headedness and headaches.  Psychiatric/Behavioral: Positive for sleep disturbance. Negative for dysphoric mood. The patient is nervous/anxious (occasional).        Objective:   Vitals:   04/22/16 1119  BP: 124/84  Pulse: 80  Resp: 16  Temp: 98.1 F (36.7 C)   Filed Weights   04/22/16 1119  Weight: 146 lb (66.2 kg)   Body mass index is 23.57 kg/m.  Wt Readings from Last 3 Encounters:  04/22/16 146 lb (66.2 kg)  01/14/16 138 lb (62.6 kg)  12/24/15 136 lb (61.7 kg)     Physical Exam Constitutional: She appears well-developed and well-nourished. No distress.  HENT:  Head: Normocephalic and atraumatic.  Right Ear: External ear normal. Normal ear canal and TM Left Ear: External ear normal.  Normal ear canal and TM Mouth/Throat: Oropharynx is clear and moist.  Eyes: Conjunctivae and EOM are normal.  Neck: Neck supple. No tracheal deviation present. No thyromegaly present.  No carotid bruit  Cardiovascular: Normal rate, regular rhythm and normal heart sounds.   No murmur heard.  No edema. Pulmonary/Chest: Effort normal and breath sounds normal. No respiratory distress. She has no wheezes. She has no rales.  Breast: deferred to Gyn Abdominal: Soft. She exhibits no distension. There is no tenderness.  Lymphadenopathy: She has no cervical adenopathy.  Skin: Skin is warm and dry. She is not diaphoretic.  Psychiatric: She has a normal mood and affect. Her behavior is normal.         Assessment & Plan:   Physical exam: Screening blood work ordered Immunizations  Up to date  Mammogram  Up to date  Gyn   scheduled Eye exams   Up to date  Exercise  - regular Weight - normal BMI Skin   No concerns Substance  abuse  none  See Problem List for Assessment and Plan of chronic medical problems.

## 2016-04-22 NOTE — Progress Notes (Signed)
Pre visit review using our clinic review tool, if applicable. No additional management support is needed unless otherwise documented below in the visit note. 

## 2016-04-23 ENCOUNTER — Other Ambulatory Visit (INDEPENDENT_AMBULATORY_CARE_PROVIDER_SITE_OTHER): Payer: BLUE CROSS/BLUE SHIELD

## 2016-04-23 DIAGNOSIS — E7800 Pure hypercholesterolemia, unspecified: Secondary | ICD-10-CM | POA: Diagnosis not present

## 2016-04-23 DIAGNOSIS — R739 Hyperglycemia, unspecified: Secondary | ICD-10-CM | POA: Diagnosis not present

## 2016-04-23 LAB — COMPREHENSIVE METABOLIC PANEL
ALBUMIN: 4.6 g/dL (ref 3.5–5.2)
ALK PHOS: 70 U/L (ref 39–117)
ALT: 12 U/L (ref 0–35)
AST: 18 U/L (ref 0–37)
BILIRUBIN TOTAL: 0.7 mg/dL (ref 0.2–1.2)
BUN: 13 mg/dL (ref 6–23)
CO2: 28 mEq/L (ref 19–32)
Calcium: 10 mg/dL (ref 8.4–10.5)
Chloride: 105 mEq/L (ref 96–112)
Creatinine, Ser: 1.05 mg/dL (ref 0.40–1.20)
GFR: 71.67 mL/min (ref >60.00)
Glucose, Bld: 86 mg/dL (ref 70–99)
POTASSIUM: 4.3 meq/L (ref 3.5–5.1)
Sodium: 139 mEq/L (ref 135–145)
TOTAL PROTEIN: 7.4 g/dL (ref 6.0–8.3)

## 2016-04-23 LAB — LIPID PANEL
CHOLESTEROL: 208 mg/dL — AB (ref 0–200)
HDL: 88.8 mg/dL (ref >39.00)
LDL Cholesterol: 112 mg/dL — ABNORMAL HIGH (ref 0–99)
NonHDL: 119.34
Total CHOL/HDL Ratio: 2
Triglycerides: 39 mg/dL (ref 0.0–149.0)
VLDL: 7.8 mg/dL (ref 0.0–40.0)

## 2016-04-23 LAB — CBC WITH DIFFERENTIAL/PLATELET
BASOS PCT: 1 % (ref 0.0–3.0)
Basophils Absolute: 0.1 10*3/uL (ref 0.0–0.1)
EOS PCT: 3.5 % (ref 0.0–5.0)
Eosinophils Absolute: 0.2 10*3/uL (ref 0.0–0.7)
HEMATOCRIT: 39.2 % (ref 36.0–46.0)
HEMOGLOBIN: 13 g/dL (ref 12.0–15.0)
LYMPHS PCT: 23.8 % (ref 12.0–46.0)
Lymphs Abs: 1.4 10*3/uL (ref 0.7–4.0)
MCHC: 33.2 g/dL (ref 30.0–36.0)
MCV: 89.8 fl (ref 78.0–100.0)
MONO ABS: 0.5 10*3/uL (ref 0.1–1.0)
MONOS PCT: 9 % (ref 3.0–12.0)
Neutro Abs: 3.7 10*3/uL (ref 1.4–7.7)
Neutrophils Relative %: 62.7 % (ref 43.0–77.0)
Platelets: 164 10*3/uL (ref 150.0–400.0)
RBC: 4.36 Mil/uL (ref 3.87–5.11)
RDW: 13.4 % (ref 11.5–15.5)
WBC: 6 10*3/uL (ref 4.0–10.5)

## 2016-04-23 LAB — HEMOGLOBIN A1C: Hgb A1c MFr Bld: 5.3 % (ref 4.6–6.5)

## 2016-04-25 ENCOUNTER — Encounter: Payer: Self-pay | Admitting: Internal Medicine

## 2016-04-28 ENCOUNTER — Other Ambulatory Visit: Payer: Self-pay | Admitting: Internal Medicine

## 2016-04-29 NOTE — Telephone Encounter (Signed)
Okay to fill? You have not filled for pt before.

## 2016-04-29 NOTE — Telephone Encounter (Signed)
Needs to be filled by rheum or derm - whoever prescribes it for her

## 2016-04-30 ENCOUNTER — Telehealth: Payer: Self-pay | Admitting: Internal Medicine

## 2016-04-30 ENCOUNTER — Telehealth: Payer: Self-pay | Admitting: Emergency Medicine

## 2016-04-30 DIAGNOSIS — R300 Dysuria: Secondary | ICD-10-CM

## 2016-04-30 NOTE — Telephone Encounter (Signed)
Orders have been entered, pt notified.

## 2016-04-30 NOTE — Telephone Encounter (Signed)
See other phone note today

## 2016-04-30 NOTE — Telephone Encounter (Signed)
Yes, ok to give sample

## 2016-04-30 NOTE — Telephone Encounter (Signed)
Pt believes she has a UTI. She states eh has UTI a lot. She would like to not come in and be seen but just be sent to the lab for testing.

## 2016-04-30 NOTE — Telephone Encounter (Signed)
Pt states that she thinks she is getting a UTI. It has gotten worse over the past few days. Are you okay with pt coming to into lab to give a specimen since you will be out of the office on Friday?

## 2016-05-05 ENCOUNTER — Telehealth: Payer: Self-pay | Admitting: *Deleted

## 2016-05-05 ENCOUNTER — Other Ambulatory Visit (INDEPENDENT_AMBULATORY_CARE_PROVIDER_SITE_OTHER): Payer: BLUE CROSS/BLUE SHIELD

## 2016-05-05 DIAGNOSIS — Z8249 Family history of ischemic heart disease and other diseases of the circulatory system: Secondary | ICD-10-CM

## 2016-05-05 DIAGNOSIS — R300 Dysuria: Secondary | ICD-10-CM | POA: Diagnosis not present

## 2016-05-05 DIAGNOSIS — E7800 Pure hypercholesterolemia, unspecified: Secondary | ICD-10-CM

## 2016-05-05 LAB — URINALYSIS
BILIRUBIN URINE: NEGATIVE
Hgb urine dipstick: NEGATIVE
KETONES UR: NEGATIVE
Leukocytes, UA: NEGATIVE
Nitrite: NEGATIVE
Total Protein, Urine: NEGATIVE
UROBILINOGEN UA: 0.2 (ref 0.0–1.0)
Urine Glucose: NEGATIVE
pH: 6 (ref 5.0–8.0)

## 2016-05-05 NOTE — Telephone Encounter (Signed)
Cardiac Calcium Score order expired, another placed in Epic. Patient scheduled for 05/22/16

## 2016-05-06 LAB — URINE CULTURE: Organism ID, Bacteria: NO GROWTH

## 2016-05-12 ENCOUNTER — Other Ambulatory Visit: Payer: BLUE CROSS/BLUE SHIELD

## 2016-05-12 ENCOUNTER — Inpatient Hospital Stay: Admission: RE | Admit: 2016-05-12 | Payer: BLUE CROSS/BLUE SHIELD | Source: Ambulatory Visit

## 2016-05-14 NOTE — Progress Notes (Deleted)
Subjective:    Patient ID: Donna Blankenship, female    DOB: 11-Apr-1965, 51 y.o.   MRN: [id number]  HPI She is here for a physical exam.     Medications and allergies reviewed with patient and updated if appropriate.  Patient Active Problem List   Diagnosis Date Noted  . H/O thyroid nodule 01/14/2016  . Anaphylactic shock due to adverse food reaction 12/24/2015  . Other allergic rhinitis 12/24/2015  . Lactose intolerance 12/24/2015  . Esophageal dysphagia 12/24/2015  . Vitamin D deficiency 12/11/2015  . Fatigue 12/11/2015  . Gastroesophageal reflux disease 12/11/2015  . Axillary lump 08/29/2015  . Paresthesia of both hands 05/25/2015  . Hashimoto's thyroiditis 02/27/2015  . Elevated lipoprotein(a) 02/27/2015  . Insomnia 02/27/2015  . Thyroid nodule 12/19/2014  . Eczema 12/19/2014  . Food allergy 12/19/2014  . Hypercalcemia 12/19/2014  . Lichen planus 11/13/2014  . GAD (generalized anxiety disorder) 08/13/2014  . Dyspnea 04/20/2014  . Hyperglycemia 01/01/2014  . Pure hypercholesterolemia 06/16/2013  . PULMONARY FIBROSIS, POSTINFLAMMATORY 09/20/2007  . COLONIC POLYPS, HX OF 10/21/2006    Current Outpatient Prescriptions on File Prior to Visit  Medication Sig Dispense Refill  . diazepam (VALIUM) 5 MG tablet TAKE 1 TABLET EVERY 12 HOURS AS NEEDED FOR ANXIETY 30 tablet 0  . EPINEPHrine 0.3 mg/0.3 mL IJ SOAJ injection Inject 0.3 mLs (0.3 mg total) into the muscle once. 1 Device 3  . fexofenadine (ALLEGRA) 180 MG tablet Take 180 mg by mouth daily.    . hydroxychloroquine (PLAQUENIL) 200 MG tablet     . LINZESS 290 MCG CAPS capsule as needed.    . naltrexone (DEPADE) 50 MG tablet Take 1 tablet (50 mg total) by mouth daily.    . progesterone (PROMETRIUM) 100 MG capsule Take 100 mg by mouth daily.    Marland Kitchen. zolpidem (AMBIEN CR) 6.25 MG CR tablet Take 1 tablet (6.25 mg total) by mouth at bedtime as needed. for sleep 30 tablet 0   No current facility-administered medications on file  prior to visit.     Past Medical History:  Diagnosis Date  . Anemia    past H/o anemia  . Anxiety    on effexor and valium in the past  . Dyspnea    seeing pulm, hx lupus pneumonitis, mild pulm fibrosis  . Eczema   . Fibromyalgia 01/18/2008   Qualifier: Diagnosis of  By: Yetta BarreJones MD, Bernadene Bellhomas L.   Marland Kitchen. GERD 01/11/2008   Qualifier: Diagnosis of  By: Yetta BarreJones MD, Bernadene Bellhomas L.   . Hyperglycemia    borderline  . Infertility, female   . Thyroid disease    past h/o hypothyroidism    Past Surgical History:  Procedure Laterality Date  . ABDOMINAL HYSTERECTOMY  2004   for fibroid; retained ovaries  . PELVIC LAPAROSCOPY  1999    Social History   Social History  . Marital status: Married    Spouse name: N/A  . Number of children: N/A  . Years of education: N/A   Social History Main Topics  . Smoking status: Never Smoker  . Smokeless tobacco: Never Used  . Alcohol use 1.0 oz/week    2 Standard drinks or equivalent per week     Comment: occ glass of wine  . Drug use: No  . Sexual activity: Yes    Partners: Male   Other Topics Concern  . Not on file   Social History Narrative   Work or School: not working  Home Situation: lives with husband      Spiritual Beliefs: Christian      Lifestyle: eats healthy, not exercising          Family History  Problem Relation Age of Onset  . Diabetes Mother   . Diabetes Father   . Stroke Father   . Anuerysm Father   . Sudden death Brother   . Heart attack Brother   . Diabetes Paternal Grandmother   . Breast cancer Maternal Grandmother   . Asthma Neg Hx   . Allergic rhinitis Neg Hx   . Immunodeficiency Neg Hx   . Urticaria Neg Hx   . Eczema Neg Hx   . Atopy Neg Hx   . Angioedema Neg Hx     Review of Systems     Objective:  There were no vitals filed for this visit. There were no vitals filed for this visit. There is no height or weight on file to calculate BMI.  Wt Readings from Last 3 Encounters:  04/22/16 146 lb (66.2  kg)  01/14/16 138 lb (62.6 kg)  12/24/15 136 lb (61.7 kg)     Physical Exam Constitutional: She appears well-developed and well-nourished. No distress.  HENT:  Head: Normocephalic and atraumatic.  Right Ear: External ear normal. Normal ear canal and TM Left Ear: External ear normal.  Normal ear canal and TM Mouth/Throat: Oropharynx is clear and moist.  Eyes: Conjunctivae and EOM are normal.  Neck: Neck supple. No tracheal deviation present. No thyromegaly present.  No carotid bruit  Cardiovascular: Normal rate, regular rhythm and normal heart sounds.   No murmur heard.  No edema. Pulmonary/Chest: Effort normal and breath sounds normal. No respiratory distress. She has no wheezes. She has no rales.  Breast: deferred to Gyn Abdominal: Soft. She exhibits no distension. There is no tenderness.  Lymphadenopathy: She has no cervical adenopathy.  Skin: Skin is warm and dry. She is not diaphoretic.  Psychiatric: She has a normal mood and affect. Her behavior is normal.         Assessment & Plan:   Physical exam: Screening blood work - reviewed Immunizations - discussed td Mammogram  Up to date  Gyn   Up to date  Eye exams Exercise Weight Skin  Substance abuse  See Problem List for Assessment and Plan of chronic medical problems.

## 2016-05-15 ENCOUNTER — Encounter: Payer: BLUE CROSS/BLUE SHIELD | Admitting: Internal Medicine

## 2016-08-04 ENCOUNTER — Telehealth: Payer: Self-pay | Admitting: Internal Medicine

## 2016-08-04 MED ORDER — ZOLPIDEM TARTRATE ER 6.25 MG PO TBCR: 6.2500 mg | EXTENDED_RELEASE_TABLET | Freq: Every evening | ORAL | 0 refills | Status: DC | PRN

## 2016-08-04 MED ORDER — DIAZEPAM 5 MG PO TABS: ORAL_TABLET | ORAL | 0 refills | Status: DC

## 2016-08-04 NOTE — Telephone Encounter (Signed)
Check [place] registry both last filled 04/23/2016 [facility].../lmb

## 2016-08-04 NOTE — Telephone Encounter (Signed)
Updated med list called refill into [facility] had to leave on pharmacy vm. Called pt no answer LMOM refills [facility]...../lmb

## 2016-08-04 NOTE — Telephone Encounter (Signed)
Ok to refill 

## 2016-08-04 NOTE — Telephone Encounter (Signed)
Pt called and needs a refill of her diazepam (VALIUM) 5 MG tablet  and AMBIEN   CPE set up for 09/10/16 Please advise

## 2016-09-01 ENCOUNTER — Encounter: Payer: BLUE CROSS/BLUE SHIELD | Admitting: Internal Medicine

## 2016-09-09 NOTE — Progress Notes (Signed)
Subjective:    Patient ID: Donna Blankenship, female    DOB: May 24, 1965, 51 y.o.   MRN: [id number]  HPI She is here for a physical exam.   She denies concerns.  Overall, she feels well.    Medications and allergies reviewed with patient and updated if appropriate.  Patient Active Problem List   Diagnosis Date Noted  . H/O thyroid nodule 01/14/2016  . Anaphylactic shock due to adverse food reaction 12/24/2015  . Other allergic rhinitis 12/24/2015  . Lactose intolerance 12/24/2015  . Esophageal dysphagia 12/24/2015  . Vitamin D deficiency 12/11/2015  . Fatigue 12/11/2015  . Gastroesophageal reflux disease 12/11/2015  . Axillary lump 08/29/2015  . Paresthesia of both hands 05/25/2015  . Hashimoto's thyroiditis 02/27/2015  . Elevated lipoprotein(a) 02/27/2015  . Insomnia 02/27/2015  . Thyroid nodule 12/19/2014  . Eczema 12/19/2014  . Food allergy 12/19/2014  . Hypercalcemia 12/19/2014  . Lichen planus 11/13/2014  . GAD (generalized anxiety disorder) 08/13/2014  . Dyspnea 04/20/2014  . Hyperglycemia 01/01/2014  . Pure hypercholesterolemia 06/16/2013  . PULMONARY FIBROSIS, POSTINFLAMMATORY 09/20/2007  . COLONIC POLYPS, HX OF 10/21/2006    Current Outpatient Prescriptions on File Prior to Visit  Medication Sig Dispense Refill  . diazepam (VALIUM) 5 MG tablet TAKE 1 TABLET EVERY 12 HOURS AS NEEDED FOR ANXIETY 30 tablet 0  . EPINEPHrine 0.3 mg/0.3 mL IJ SOAJ injection Inject 0.3 mLs (0.3 mg total) into the muscle once. 1 Device 3  . fexofenadine (ALLEGRA) 180 MG tablet Take 180 mg by mouth daily.    . hydroxychloroquine (PLAQUENIL) 200 MG tablet     . naltrexone (DEPADE) 50 MG tablet Take 1 tablet (50 mg total) by mouth daily.     No current facility-administered medications on file prior to visit.     Past Medical History:  Diagnosis Date  . Anemia    past H/o anemia  . Anxiety    on effexor and valium in the past  . Dyspnea    seeing pulm, hx lupus pneumonitis, mild  pulm fibrosis  . Eczema   . Fibromyalgia 01/18/2008   Qualifier: Diagnosis of  By: Yetta Barre MD, Bernadene Bell.   Marland Kitchen GERD 01/11/2008   Qualifier: Diagnosis of  By: Yetta Barre MD, Bernadene Bell.   . Hyperglycemia    borderline  . Infertility, female   . Thyroid disease    past h/o hypothyroidism    Past Surgical History:  Procedure Laterality Date  . ABDOMINAL HYSTERECTOMY  2004   for fibroid; retained ovaries  . PELVIC LAPAROSCOPY  1999    Social History   Social History  . Marital status: Married    Spouse name: N/A  . Number of children: N/A  . Years of education: N/A   Social History Main Topics  . Smoking status: Never Smoker  . Smokeless tobacco: Never Used  . Alcohol use 1.0 oz/week    2 Standard drinks or equivalent per week     Comment: occ glass of wine  . Drug use: No  . Sexual activity: Yes    Partners: Male   Other Topics Concern  . None   Social History Narrative   Work or School: not working      Home Situation: lives with husband      Spiritual Beliefs: Christian      Lifestyle: eats healthy, not exercising          Family History  Problem Relation Age of Onset  . Diabetes Mother   .  Diabetes Father   . Stroke Father   . Anuerysm Father   . Sudden death Brother   . Heart attack Brother   . Diabetes Paternal Grandmother   . Breast cancer Maternal Grandmother   . Asthma Neg Hx   . Allergic rhinitis Neg Hx   . Immunodeficiency Neg Hx   . Urticaria Neg Hx   . Eczema Neg Hx   . Atopy Neg Hx   . Angioedema Neg Hx     Review of Systems  Constitutional: Negative for chills and fever.  Eyes: Negative for visual disturbance.  Respiratory: Negative for cough, shortness of breath and wheezing.   Cardiovascular: Negative for chest pain, palpitations and leg swelling.  Gastrointestinal: Positive for constipation. Negative for abdominal pain, blood in stool, diarrhea and nausea.       No gerd  Genitourinary: Negative for dysuria and hematuria.    Musculoskeletal: Positive for back pain (lower back - mild). Negative for arthralgias.  Skin: Negative for color change and rash.  Neurological: Negative for light-headedness and headaches.  Psychiatric/Behavioral: Positive for sleep disturbance. Negative for dysphoric mood. The patient is nervous/anxious (mild increased stress).        Objective:   Vitals:   09/10/16 0841  BP: 134/84  Pulse: 65  Resp: 16  Temp: 98.1 F (36.7 C)  SpO2: 98%   Filed Weights   09/10/16 0841  Weight: 147 lb (66.7 kg)   Body mass index is 23.73 kg/m.  Wt Readings from Last 3 Encounters:  09/10/16 147 lb (66.7 kg)  04/22/16 146 lb (66.2 kg)  01/14/16 138 lb (62.6 kg)     Physical Exam Constitutional: She appears well-developed and well-nourished. No distress.  HENT:  Head: Normocephalic and atraumatic.  Right Ear: External ear normal. Normal ear canal and TM Left Ear: External ear normal.  Normal ear canal and TM Mouth/Throat: Oropharynx is clear and moist.  Eyes: Conjunctivae and EOM are normal.  Neck: Neck supple. No tracheal deviation present. No thyromegaly present.  No carotid bruit  Cardiovascular: Normal rate, regular rhythm and normal heart sounds.   No murmur heard.  No edema. Pulmonary/Chest: Effort normal and breath sounds normal. No respiratory distress. She has no wheezes. She has no rales.  Breast: deferred to Gyn Abdominal: Soft. She exhibits no distension. There is no tenderness.  Lymphadenopathy: She has no cervical adenopathy.  Skin: Skin is warm and dry. She is not diaphoretic.  Psychiatric: She has a normal mood and affect. Her behavior is normal.       Assessment & Plan:   Physical exam: Screening blood work  reviewed Immunizations  Flu recommended,  Discussed tdap - deferred both Mammogram   Up to date  Colonoscopy - done last 2013, due now will check into cologuard Gyn     not up to date - will schedule Eye exams   Up to date  Exercise  regular Weight   Normal BMI Skin  No concerns, sees derm Substance abuse   none  See Problem List for Assessment and Plan of chronic medical problems.  FU in one year

## 2016-09-09 NOTE — Patient Instructions (Addendum)
[facility]  [place]  [place], [place] [address]  Main: [place] [address] [place], [place] [address] [phone]   [place] [facility] [address] [place], [place] Across the street from [facility] [phone]   All other Health Maintenance issues reviewed.   All recommended immunizations and age-appropriate screenings are up-to-date or discussed.  No immunizations administered today.   Medications reviewed and updated.  Changes include changing ambien to 5 mg nightly as needed.  Your prescription(s) have been submitted to your pharmacy. Please take as directed and contact our office if you believe you are having problem(s) with the medication(s).   Please followup in one year   Health Maintenance, Female Adopting a healthy lifestyle and getting preventive care can go a long way to promote health and wellness. Talk with your health care provider about what schedule of regular examinations is right for you. This is a good chance for you to check in with your provider about disease prevention and staying healthy. In between checkups, there are plenty of things you can do on your own. Experts have done a lot of research about which lifestyle changes and preventive measures are most likely to keep you healthy. Ask your health care provider for more information. Weight and diet Eat a healthy diet  Be sure to include plenty of vegetables, fruits, low-fat dairy products, and lean protein.  Do not eat a lot of foods high in solid fats, added sugars, or salt.  Get regular exercise. This is one of the most important things you can do for your health. ? Most adults should exercise for at least 150 minutes each week. The exercise should increase your heart rate and make you sweat (moderate-intensity  exercise). ? Most adults should also do strengthening exercises at least twice a week. This is in addition to the moderate-intensity exercise.  Maintain a healthy weight  Body mass index (BMI) is a measurement that can be used to identify possible weight problems. It estimates body fat based on height and weight. Your health care provider can help determine your BMI and help you achieve or maintain a healthy weight.  For females 71 years of age and older: ? A BMI below 18.5 is considered underweight. ? A BMI of 18.5 to 24.9 is normal. ? A BMI of 25 to 29.9 is considered overweight. ? A BMI of 30 and above is considered obese.  Watch levels of cholesterol and blood lipids  You should start having your blood tested for lipids and cholesterol at 49 years of age, then have this test every 5 years.  You may need to have your cholesterol levels checked more often if: ? Your lipid or cholesterol levels are high. ? You are older than 49 years of age. ? You are at high risk for heart disease.  Cancer screening Lung Cancer  Lung cancer screening is recommended for adults 31-55 years old who are at high risk for lung cancer because of a history of smoking.  A yearly low-dose CT scan of the lungs is recommended for people who: ? Currently smoke. ? Have quit within the past 15 years. ? Have at least a 30-pack-year history of smoking. A pack year is smoking an average of one pack of cigarettes a day for 1 year.  Yearly screening should continue until it has been 15 years since  you quit.  Yearly screening should stop if you develop a health problem that would prevent you from having lung cancer treatment.  Breast Cancer  Practice breast self-awareness. This means understanding how your breasts normally appear and feel.  It also means doing regular breast self-exams. Let your health care provider know about any changes, no matter how small.  If you are in your 20s or 30s, you should have a  clinical breast exam (CBE) by a health care provider every 1-3 years as part of a regular health exam.  If you are 73 or older, have a CBE every year. Also consider having a breast X-ray (mammogram) every year.  If you have a family history of breast cancer, talk to your health care provider about genetic screening.  If you are at high risk for breast cancer, talk to your health care provider about having an MRI and a mammogram every year.  Breast cancer gene (BRCA) assessment is recommended for women who have family members with BRCA-related cancers. BRCA-related cancers include: ? Breast. ? Ovarian. ? Tubal. ? Peritoneal cancers.  Results of the assessment will determine the need for genetic counseling and BRCA1 and BRCA2 testing.  Cervical Cancer Your health care provider may recommend that you be screened regularly for cancer of the pelvic organs (ovaries, uterus, and vagina). This screening involves a pelvic examination, including checking for microscopic changes to the surface of your cervix (Pap test). You may be encouraged to have this screening done every 3 years, beginning at age 31.  For women ages 35-65, health care providers may recommend pelvic exams and Pap testing every 3 years, or they may recommend the Pap and pelvic exam, combined with testing for human papilloma virus (HPV), every 5 years. Some types of HPV increase your risk of cervical cancer. Testing for HPV may also be done on women of any age with unclear Pap test results.  Other health care providers may not recommend any screening for nonpregnant women who are considered low risk for pelvic cancer and who do not have symptoms. Ask your health care provider if a screening pelvic exam is right for you.  If you have had past treatment for cervical cancer or a condition that could lead to cancer, you need Pap tests and screening for cancer for at least 20 years after your treatment. If Pap tests have been discontinued,  your risk factors (such as having a new sexual partner) need to be reassessed to determine if screening should resume. Some women have medical problems that increase the chance of getting cervical cancer. In these cases, your health care provider may recommend more frequent screening and Pap tests.  Colorectal Cancer  This type of cancer can be detected and often prevented.  Routine colorectal cancer screening usually begins at 49 years of age and continues through 49 years of age.  Your health care provider may recommend screening at an earlier age if you have risk factors for colon cancer.  Your health care provider may also recommend using home test kits to check for hidden blood in the stool.  A small camera at the end of a tube can be used to examine your colon directly (sigmoidoscopy or colonoscopy). This is done to check for the earliest forms of colorectal cancer.  Routine screening usually begins at age 74.  Direct examination of the colon should be repeated every 5-10 years through 49 years of age. However, you may need to be screened more often if early  forms of precancerous polyps or small growths are found.  Skin Cancer  Check your skin from head to toe regularly.  Tell your health care provider about any new moles or changes in moles, especially if there is a change in a mole's shape or color.  Also tell your health care provider if you have a mole that is larger than the size of a pencil eraser.  Always use sunscreen. Apply sunscreen liberally and repeatedly throughout the day.  Protect yourself by wearing long sleeves, pants, a wide-brimmed hat, and sunglasses whenever you are outside.  Heart disease, diabetes, and high blood pressure  High blood pressure causes heart disease and increases the risk of stroke. High blood pressure is more likely to develop in: ? People who have blood pressure in the high end of the normal range (130-139/85-89 mm Hg). ? People who are  overweight or obese. ? People who are African American.  If you are 54-57 years of age, have your blood pressure checked every 3-5 years. If you are 65 years of age or older, have your blood pressure checked every year. You should have your blood pressure measured twice-once when you are at a hospital or clinic, and once when you are not at a hospital or clinic. Record the average of the two measurements. To check your blood pressure when you are not at a hospital or clinic, you can use: ? An automated blood pressure machine at a pharmacy. ? A home blood pressure monitor.  If you are between 64 years and 69 years old, ask your health care provider if you should take aspirin to prevent strokes.  Have regular diabetes screenings. This involves taking a blood sample to check your fasting blood sugar level. ? If you are at a normal weight and have a low risk for diabetes, have this test once every three years after 49 years of age. ? If you are overweight and have a high risk for diabetes, consider being tested at a younger age or more often. Preventing infection Hepatitis B  If you have a higher risk for hepatitis B, you should be screened for this virus. You are considered at high risk for hepatitis B if: ? You were born in a country where hepatitis B is common. Ask your health care provider which countries are considered high risk. ? Your parents were born in a high-risk country, and you have not been immunized against hepatitis B (hepatitis B vaccine). ? You have HIV or AIDS. ? You use needles to inject street drugs. ? You live with someone who has hepatitis B. ? You have had sex with someone who has hepatitis B. ? You get hemodialysis treatment. ? You take certain medicines for conditions, including cancer, organ transplantation, and autoimmune conditions.  Hepatitis C  Blood testing is recommended for: ? Everyone born from [birth year] through 1963. ? Anyone with known risk factors for  hepatitis C.  Sexually transmitted infections (STIs)  You should be screened for sexually transmitted infections (STIs) including gonorrhea and chlamydia if: ? You are sexually active and are younger than 49 years of age. ? You are older than 49 years of age and your health care provider tells you that you are at risk for this type of infection. ? Your sexual activity has changed since you were last screened and you are at an increased risk for chlamydia or gonorrhea. Ask your health care provider if you are at risk.  If you do not have HIV,  but are at risk, it may be recommended that you take a prescription medicine daily to prevent HIV infection. This is called pre-exposure prophylaxis (PrEP). You are considered at risk if: ? You are sexually active and do not regularly use condoms or know the HIV status of your partner(s). ? You take drugs by injection. ? You are sexually active with a partner who has HIV.  Talk with your health care provider about whether you are at high risk of being infected with HIV. If you choose to begin PrEP, you should first be tested for HIV. You should then be tested every 3 months for as long as you are taking PrEP. Pregnancy  If you are premenopausal and you may become pregnant, ask your health care provider about preconception counseling.  If you may become pregnant, take 400 to 800 micrograms (mcg) of folic acid every day.  If you want to prevent pregnancy, talk to your health care provider about birth control (contraception). Osteoporosis and menopause  Osteoporosis is a disease in which the bones lose minerals and strength with aging. This can result in serious bone fractures. Your risk for osteoporosis can be identified using a bone density scan.  If you are 33 years of age or older, or if you are at risk for osteoporosis and fractures, ask your health care provider if you should be screened.  Ask your health care provider whether you should take a  calcium or vitamin D supplement to lower your risk for osteoporosis.  Menopause may have certain physical symptoms and risks.  Hormone replacement therapy may reduce some of these symptoms and risks. Talk to your health care provider about whether hormone replacement therapy is right for you. Follow these instructions at home:  Schedule regular health, dental, and eye exams.  Stay current with your immunizations.  Do not use any tobacco products including cigarettes, chewing tobacco, or electronic cigarettes.  If you are pregnant, do not drink alcohol.  If you are breastfeeding, limit how much and how often you drink alcohol.  Limit alcohol intake to no more than 1 drink per day for nonpregnant women. One drink equals 12 ounces of beer, 5 ounces of wine, or 1 ounces of hard liquor.  Do not use street drugs.  Do not share needles.  Ask your health care provider for help if you need support or information about quitting drugs.  Tell your health care provider if you often feel depressed.  Tell your health care provider if you have ever been abused or do not feel safe at home. This information is not intended to replace advice given to you by your health care provider. Make sure you discuss any questions you have with your health care provider. Document Released: 07/07/2010 Document Revised: 05/30/2015 Document Reviewed: 09/25/2014 Elsevier Interactive Patient Education  [employer].

## 2016-09-10 ENCOUNTER — Ambulatory Visit (INDEPENDENT_AMBULATORY_CARE_PROVIDER_SITE_OTHER): Payer: BLUE CROSS/BLUE SHIELD | Admitting: Internal Medicine

## 2016-09-10 ENCOUNTER — Encounter: Payer: Self-pay | Admitting: Internal Medicine

## 2016-09-10 VITALS — BP 134/84 | HR 65 | Temp 98.1°F | Resp 16 | Ht 66.0 in | Wt 147.0 lb

## 2016-09-10 DIAGNOSIS — G4700 Insomnia, unspecified: Secondary | ICD-10-CM

## 2016-09-10 DIAGNOSIS — F411 Generalized anxiety disorder: Secondary | ICD-10-CM

## 2016-09-10 DIAGNOSIS — Z0000 Encounter for general adult medical examination without abnormal findings: Secondary | ICD-10-CM

## 2016-09-10 DIAGNOSIS — R739 Hyperglycemia, unspecified: Secondary | ICD-10-CM | POA: Diagnosis not present

## 2016-09-10 DIAGNOSIS — E7800 Pure hypercholesterolemia, unspecified: Secondary | ICD-10-CM | POA: Diagnosis not present

## 2016-09-10 MED ORDER — ZOLPIDEM TARTRATE 5 MG PO TABS: 5.0000 mg | ORAL_TABLET | Freq: Every evening | ORAL | 0 refills | Status: DC | PRN

## 2016-09-10 NOTE — Assessment & Plan Note (Signed)
Takes valium occasionally - helps with lichen planus symptoms

## 2016-09-10 NOTE — Assessment & Plan Note (Signed)
Last lipid panel improved Continue healthy diet, regular exercise

## 2016-09-10 NOTE — Assessment & Plan Note (Signed)
Lab Results  Component Value Date   HGBA1C 5.3 04/23/2016    Sugars well controlled

## 2016-09-10 NOTE — Assessment & Plan Note (Signed)
Taking ambien as needed  

## 2016-09-14 ENCOUNTER — Other Ambulatory Visit (INDEPENDENT_AMBULATORY_CARE_PROVIDER_SITE_OTHER): Payer: BLUE CROSS/BLUE SHIELD

## 2016-09-14 ENCOUNTER — Ambulatory Visit (INDEPENDENT_AMBULATORY_CARE_PROVIDER_SITE_OTHER): Payer: BLUE CROSS/BLUE SHIELD | Admitting: Internal Medicine

## 2016-09-14 ENCOUNTER — Encounter: Payer: Self-pay | Admitting: Internal Medicine

## 2016-09-14 VITALS — BP 118/70 | HR 70 | Wt 147.0 lb

## 2016-09-14 DIAGNOSIS — E063 Autoimmune thyroiditis: Secondary | ICD-10-CM | POA: Diagnosis not present

## 2016-09-14 DIAGNOSIS — Z8639 Personal history of other endocrine, nutritional and metabolic disease: Secondary | ICD-10-CM | POA: Diagnosis not present

## 2016-09-14 LAB — T3, FREE: T3 FREE: 2.9 pg/mL (ref 2.3–4.2)

## 2016-09-14 LAB — T4, FREE: Free T4: 0.86 ng/dL (ref 0.60–1.60)

## 2016-09-14 LAB — TSH: TSH: 1.1 u[IU]/mL (ref 0.35–4.50)

## 2016-09-14 NOTE — Patient Instructions (Addendum)
Please stop at [facility].  Please return in 1 year.  

## 2016-09-14 NOTE — Progress Notes (Signed)
Patient ID: Donna Blankenship, female   DOB: 1965-02-20, 51 y.o.   MRN: [id number]    HPI  Donna Blankenship is a 51 y.o.-year-old female, returning forf /u for Hashimoto's thyroiditis. She has been previously seen by Dr. Allena Blankenship, but cannot afford to go see her anymore. Last visit with me 8 mo ago.  Reviewed hx: Pt. has been dx with Hashimoto's thyroiditis hypothyroidism in ~2002.  She was initially on Armour Thyroid Rx'ed at the [facility] >> was feeling well on it >> she stopped as he ran out >> saw endocrinology >> started Synthroid DAW >> felt terrible on this (panic attacks, very emotional, went to ED 2-3x for CP - would not want to repeat) >> She then came off Synthroid >> TFTs were normal off meds afterwards.  I reviewed pt's thyroid tests - at last check, TSH was normal, but free T3 and free T4 were elevated: Lab Results  Component Value Date   TSH 2.18 03/17/2016   TSH 1.39 12/11/2015   TSH 1.44 10/30/2014   TSH 1.30 06/17/2012   TSH 1.727 09/15/2011   TSH 0.09 (L) 01/18/2008   TSH 0.64 09/28/2007   FREET4 1.75 (H) 03/17/2016   FREET4 0.72 12/11/2015   FREET4 0.7 09/28/2007    Lab Results  Component Value Date   T3FREE 7.2 (H) 03/17/2016   T3FREE 2.6 12/11/2015   T3FREE 2.9 09/28/2007    Component     Latest Ref Rng & Units 03/17/2016  Thyroperoxidase Ab SerPl-aCnc     <9 IU/mL 67 (H)  Thyroglobulin Ab     <2 IU/mL 177 (H)   Labs also reviewed per records from Dr. Alessandra Blankenship: 08/14/2015: - TSH 2.00 (0.40-4.50) - rT3 18 (8-25) - TPO antibodies 151 (<9) - ATA antibodies 241 (<2)  01/28/2015: - TPO antibodies 339 (<9) - ATA antibodies 232 (<2)  Pt describes: - no weight changes - no fatigue - + hot flushes - entered menopause after her partial hysterectomy 5 years ago - no depression or anxiety - + constipation >> was on Linzess >> but stopped - no dry skin  - + hair loss  Pt denies: - feeling nodules in neck - hoarseness - dysphagia - choking -  SOB with lying down  She has a h/o small thyroid nodules by a thyroid U/S ~2010. A new thyroid U/S obtained after last visit (01/16/2016) showed only small nodulesm, w/o the need for dedicated f/u:  She is still on Selenium 100 mg 1-2x a day. She stopped Biotin since last visit.  She has no FH of thyroid disorders. No FH of thyroid cancer. No h/o radiation tx to head or neck.  No seaweed or kelp. No recent contrast studies. No herbal supplements - ALA, food enzymes, bromalin and quercetin, MVI, melatonin. No Biotin use. No recent steroids use.   Pt. also has a history of SLE (dx'ed in her 19s).She was on Prednisone for 1.5 years >> then was able to come off >> her SLE appeared to be cured. In last 2 years, she developed lichen planus on scalp.  She just had an APE: everything was normal and LDL was much better after she changed her diet to include healthier fats: olive oil, avocado.  ROS: Constitutional: + see HPI, + nocturia Eyes: no blurry vision, no xerophthalmia ENT: no sore throat, + see HPI Cardiovascular: no CP/no SOB/no palpitations/no leg swelling Respiratory: no cough/no SOB/no wheezing Gastrointestinal: no N/no V/no D/+ C/no acid reflux Musculoskeletal: no muscle aches/no  joint aches Skin: no rashes, + hair loss Neurological: no tremors/no numbness/no tingling/no dizziness  I reviewed pt's medications, allergies, PMH, social hx, family hx, and changes were documented in the history of present illness. Otherwise, unchanged from my initial visit note.  Past Medical History:  Diagnosis Date  . Anemia    past H/o anemia  . Anxiety    on effexor and valium in the past  . Dyspnea    seeing pulm, hx lupus pneumonitis, mild pulm fibrosis  . Eczema   . Fibromyalgia 01/18/2008   Qualifier: Diagnosis of  By: Yetta Barre MD, Bernadene Bell.   Marland Kitchen GERD 01/11/2008   Qualifier: Diagnosis of  By: Yetta Barre MD, Bernadene Bell.   . Hyperglycemia    borderline  . Infertility, female   . Thyroid disease     past h/o hypothyroidism   Past Surgical History:  Procedure Laterality Date  . ABDOMINAL HYSTERECTOMY  2004   for fibroid; retained ovaries  . PELVIC LAPAROSCOPY  1999   Social History   Social History  . Marital status: Married    Spouse name: N/A  . Number of children: 0   Occupational History  . N/a   Social History Main Topics  . Smoking status: Never Smoker  . Smokeless tobacco: Never Used  . Alcohol use No           . Drug use: No   Current Outpatient Prescriptions on File Prior to Visit  Medication Sig Dispense Refill  . diazepam (VALIUM) 5 MG tablet TAKE 1 TABLET EVERY 12 HOURS AS NEEDED FOR ANXIETY 30 tablet 0  . fexofenadine (ALLEGRA) 180 MG tablet Take 180 mg by mouth daily.    . hydroxychloroquine (PLAQUENIL) 200 MG tablet     . naltrexone (DEPADE) 50 MG tablet Take 1 tablet (50 mg total) by mouth daily.    Marland Kitchen EPINEPHrine 0.3 mg/0.3 mL IJ SOAJ injection Inject 0.3 mLs (0.3 mg total) into the muscle once. (Patient not taking: Reported on 09/14/2016) 1 Device 3  . zolpidem (AMBIEN) 5 MG tablet Take 1 tablet (5 mg total) by mouth at bedtime as needed for sleep. (Patient not taking: Reported on 09/14/2016) 30 tablet 0   No current facility-administered medications on file prior to visit.    Allergies  Allergen Reactions  . Eggs Or Egg-Derived Products   . Flax Seeds [Flaxseed (Linseed)]   . Gluten Meal   . Latex     Band aids and gloves  . Milk-Related Compounds   . Peanut-Containing Drug Products     All tree nuts   . Sesame Seed Extract Allergy Skin Test     Itchy throat  . Shellfish Allergy   . Maxim Bran   . Sulfamethoxazole Rash    REACTION: unspecified   Family History  Problem Relation Age of Onset  . Diabetes Mother   . Diabetes Father   . Stroke Father   . Anuerysm Father   . Sudden death Brother   . Heart attack Brother   . Diabetes Paternal Grandmother   . Breast cancer Maternal Grandmother   . Asthma Neg Hx   . Allergic rhinitis Neg  Hx   . Immunodeficiency Neg Hx   . Urticaria Neg Hx   . Eczema Neg Hx   . Atopy Neg Hx   . Angioedema Neg Hx    PE: BP 118/70 (BP Location: Left Arm, Patient Position: Sitting)   Pulse 70   Wt 147 lb (66.7 kg)   LMP  01/05/2002   SpO2 96%   BMI 23.73 kg/m  Wt Readings from Last 3 Encounters:  09/14/16 147 lb (66.7 kg)  09/10/16 147 lb (66.7 kg)  04/22/16 146 lb (66.2 kg)   Constitutional: normal weight, in NAD Eyes: PERRLA, EOMI, no exophthalmos ENT: moist mucous membranes, no thyromegaly, no cervical lymphadenopathy Cardiovascular: RRR, No MRG Respiratory: CTA B Gastrointestinal: abdomen soft, NT, ND, BS+ Musculoskeletal: no deformities, strength intact in all 4 Skin: moist, warm, no rashes Neurological: no tremor with outstretched hands, DTR normal in all 4  ASSESSMENT: 1. Hashimoto thyroiditis  2. H/o thyroid nodules  PLAN: 1. Hashimoto thyroiditis - discussed about the fluctuating nature of the Hashimoto's thyroiditis - she had a normal TSH at last check but free T4 and free T3 were high >> I explained that this could have been caused by transient thyroiditis >> no intervention needed at that point - will recheck TFTs today - if needs to start LT4, she would like to avoid Synthroid - discussed about other options like NT and Armour Thyroid - she will continue Selenium at 100-200 mcg daily - we will recheck her thyroid Abs at next visit - RTC in 1 year, but will likely need TFTs checked sooner  2. History of thyroid nodules - Per thyroid ultrasound ~2010 >> only small nodules per U/S in 01/2016 - no neck compression sxs - no need to continue to follow these  Appointment on 09/14/2016  Component Date Value Ref Range Status  . T3, Free 09/14/2016 2.9  2.3 - 4.2 pg/mL Final  . Free T4 09/14/2016 0.86  0.60 - 1.60 ng/dL Final   Comment: Specimens from patients who are undergoing biotin therapy and /or ingesting biotin supplements may contain high levels of biotin.   The higher biotin concentration in these specimens interferes with this Free T4 assay.  Specimens that contain high levels  of biotin may cause false high results for this Free T4 assay.  Please interpret results in light of the total clinical presentation of the patient.    Marland Kitchen TSH 09/14/2016 1.10  0.35 - 4.50 uIU/mL Final   Normal TFTs, c/w resolved thyroiditis.  Carlus Pavlov, MD PhD [facility] Endocrinology

## 2016-11-02 ENCOUNTER — Telehealth: Payer: Self-pay | Admitting: Internal Medicine

## 2016-11-02 DIAGNOSIS — L659 Nonscarring hair loss, unspecified: Secondary | ICD-10-CM | POA: Diagnosis not present

## 2016-11-02 DIAGNOSIS — L661 Lichen planopilaris: Secondary | ICD-10-CM | POA: Diagnosis not present

## 2016-11-02 MED ORDER — DIAZEPAM 5 MG PO TABS: ORAL_TABLET | ORAL | 0 refills | Status: DC

## 2016-11-02 NOTE — Telephone Encounter (Signed)
Ok to fill 

## 2016-11-02 NOTE — Telephone Encounter (Signed)
Pt called requesting a refill on rediazepam (VALIUM) 5 MG tablet to be sent to [facility] on [address]. She said that she is leaving first thing in the morning to fly out of town and wanted to know if this could be sent today so she can pick it up tonight before she leaves. Please advise.

## 2016-11-02 NOTE — Telephone Encounter (Signed)
Check [place] registry last filled 08/12/2016.../lmb  

## 2016-11-02 NOTE — Telephone Encounter (Signed)
Called pt no answer LMOM [facility] had to leave on pharmacy vm...Raechel Chute/lmb

## 2017-04-23 ENCOUNTER — Ambulatory Visit: Payer: BLUE CROSS/BLUE SHIELD | Admitting: Physician Assistant

## 2017-04-23 ENCOUNTER — Other Ambulatory Visit: Payer: Self-pay

## 2017-04-23 ENCOUNTER — Encounter: Payer: Self-pay | Admitting: Physician Assistant

## 2017-04-23 VITALS — BP 112/70 | HR 94 | Temp 97.9°F | Resp 18 | Ht 66.0 in | Wt 156.2 lb

## 2017-04-23 DIAGNOSIS — R350 Frequency of micturition: Secondary | ICD-10-CM

## 2017-04-23 LAB — POCT URINALYSIS DIP (MANUAL ENTRY)
BILIRUBIN UA: NEGATIVE
Glucose, UA: NEGATIVE mg/dL
Ketones, POC UA: NEGATIVE mg/dL
NITRITE UA: NEGATIVE
PH UA: 6 (ref 5.0–8.0)
Protein Ur, POC: NEGATIVE mg/dL
Spec Grav, UA: 1.015 (ref 1.010–1.025)
Urobilinogen, UA: 0.2 E.U./dL

## 2017-04-23 MED ORDER — CEPHALEXIN 500 MG PO CAPS: 500.0000 mg | ORAL_CAPSULE | Freq: Three times a day (TID) | ORAL | 0 refills | Status: DC

## 2017-04-23 NOTE — Progress Notes (Signed)
04/23/2017 12:55 PM   DOB: 1965/04/25 / MRN: [id number]  SUBJECTIVE:  Donna Blankenship is a 52 y.o. female presenting for 4 days of dysuria, urgency, frequency.  She denies hematuria.  She does complain of low back pain.  She is allergic to eggs or egg-derived products; flax seeds [flaxseed (linseed)]; gluten meal; latex; milk-related compounds; peanut-containing drug products; sesame seed extract allergy skin test; shellfish allergy; [place] bran; and sulfamethoxazole.   She  has a past medical history of Anemia, Anxiety, Dyspnea, Eczema, Fibromyalgia (01/18/2008), GERD (01/11/2008), H/O thyroid nodule (01/14/2016), Hyperglycemia, Infertility, female, and Thyroid disease.    She  reports that she has never smoked. She has never used smokeless tobacco. She reports that she drinks about 1.0 oz of alcohol per week. She reports that she does not use drugs. She  reports that she currently engages in sexual activity and has had partners who are Female. The patient  has a past surgical history that includes Abdominal hysterectomy (2004) and Pelvic laparoscopy (1999).  Her family history includes Anuerysm in her father; Breast cancer in her maternal grandmother; Diabetes in her father, mother, and paternal grandmother; Heart attack in her brother; Stroke in her father; Sudden death in her brother.  Review of Systems  Constitutional: Negative for chills, diaphoresis and fever.  Gastrointestinal: Negative for nausea.  Skin: Negative for rash.  Neurological: Negative for dizziness.    The problem list and medications were reviewed and updated by myself where necessary and exist elsewhere in the encounter.   OBJECTIVE:  BP 112/70 (BP Location: Right Arm, Patient Position: Sitting, Cuff Size: Normal)   Pulse 94   Temp 97.9 F (36.6 C) (Oral)   Resp 18   Ht 5\' 6"  (1.676 m)   Wt 156 lb 3.2 oz (70.9 kg)   LMP 01/05/2002   SpO2 98%   BMI 25.21 kg/m    Physical Exam  Constitutional: She is oriented to  person, place, and time. She appears well-developed and well-nourished. No distress.  Eyes: Pupils are equal, round, and reactive to light. EOM are normal.  Cardiovascular: Normal rate, regular rhythm, S1 normal, S2 normal, normal heart sounds and intact distal pulses. Exam reveals no gallop, no friction rub and no decreased pulses.  No murmur heard. Pulmonary/Chest: Effort normal. No stridor. No respiratory distress. She has no wheezes. She has no rales.  Abdominal: She exhibits no distension. There is no CVA tenderness.  Musculoskeletal: Normal range of motion. She exhibits no edema.  Neurological: She is alert and oriented to person, place, and time. No cranial nerve deficit. Gait normal.  Skin: Skin is warm and dry. She is not diaphoretic.  Psychiatric: She has a normal mood and affect.  Vitals reviewed.   Results for orders placed or performed in visit on 04/23/17 (from the past 72 hour(s))  POCT urinalysis dipstick     Status: Abnormal   Collection Time: 04/23/17  9:51 AM  Result Value Ref Range   Color, UA yellow yellow   Clarity, UA clear clear   Glucose, UA negative negative mg/dL   Bilirubin, UA negative negative   Ketones, POC UA negative negative mg/dL   Spec Grav, UA 7.829 5.621 - 1.025   Blood, UA small (A) negative   pH, UA 6.0 5.0 - 8.0   Protein Ur, POC negative negative mg/dL   Urobilinogen, UA 0.2 0.2 or 1.0 E.U./dL   Nitrite, UA Negative Negative   Leukocytes, UA Small (1+) (A) Negative    No  results found.  ASSESSMENT AND PLAN:  Fair was seen today for urinary frequency.  Diagnoses and all orders for this visit:  Urinary frequency -     POCT urinalysis dipstick -     cephALEXin (KEFLEX) 500 MG capsule; Take 1 capsule (500 mg total) by mouth 3 (three) times daily. Eat when you take this. -     Urine Culture    The patient is advised to call or return to clinic if she does not see an improvement in symptoms, or to seek the care of the closest  emergency department if she worsens with the above plan.   Deliah Boston, MHS, PA-C [facility] at [facility] 04/23/2017 12:55 PM

## 2017-04-23 NOTE — Patient Instructions (Addendum)
Drink lots of water. Cranberry juice is good.     IF you received an x-ray today, you will receive an invoice from [facility]. Please contact [facility] at [phone] with questions or concerns regarding your invoice.   IF you received labwork today, you will receive an invoice from [facility]. Please contact [facility] at [phone] with questions or concerns regarding your invoice.   Our billing staff will not be able to assist you with questions regarding bills from these companies.  You will be contacted with the lab results as soon as they are available. The fastest way to get your results is to activate your My Chart account. Instructions are located on the last page of this paperwork. If you have not heard from us regarding the results in 2 weeks, please contact this office.

## 2017-04-24 ENCOUNTER — Ambulatory Visit: Payer: BLUE CROSS/BLUE SHIELD | Admitting: Family Medicine

## 2017-04-26 LAB — URINE CULTURE

## 2017-05-09 NOTE — Progress Notes (Signed)
Subjective:    Patient ID: Donna Blankenship, female    DOB: 06/25/1965, 52 y.o.   MRN: [id number]  HPI The patient is here for follow up.  Dysuria:  She was seen 2 days ago at urgent care.  She was started on an antibotic. Her urine smells like acid.  She is still having dysuria.  She denies increased urinary frequency.  She denies dysuria or abdominal pain.  She has not had any fevers.  She is concerned that the ascitic smell to the urine is related to having too much acid in her stomach.  GERD:  She was having too little stomach acid and started taking hcl and now she has too much acid and is having GERD.  She has revised her diet and is currently taking baking soda after she eats, which is helping.  After eating she has pins and needles sensation in her fingers and feet and sometimes it goes up her legs. .  This can linger, but often subsides after 15-20 minutes.  She is concerned that this is also related to having too much acid in her system.  She is taking a probiotic enzyme, but it does not seem to be helping.  Her mother has a neuropathy and she is concerned about that.  If she is in a high anxiety states she can have the pins and needles and once she calms down it goes away.  She currently feels her anxiety is well controlled does not feel this is related to anxiety.  She takes Valium on occasion, but not often.  Medications and allergies reviewed with patient and updated if appropriate.  Patient Active Problem List   Diagnosis Date Noted  . Anaphylactic shock due to adverse food reaction 12/24/2015  . Other allergic rhinitis 12/24/2015  . Lactose intolerance 12/24/2015  . Esophageal dysphagia 12/24/2015  . Vitamin D deficiency 12/11/2015  . Gastroesophageal reflux disease 12/11/2015  . Axillary lump 08/29/2015  . Paresthesia of both hands 05/25/2015  . Hashimoto's thyroiditis 02/27/2015  . Elevated lipoprotein(a) 02/27/2015  . Insomnia 02/27/2015  . Thyroid nodule 12/19/2014   . Eczema 12/19/2014  . Food allergy 12/19/2014  . Hypercalcemia 12/19/2014  . Lichen planus 11/13/2014  . GAD (generalized anxiety disorder) 08/13/2014  . Dyspnea 04/20/2014  . Hyperglycemia 01/01/2014  . Pure hypercholesterolemia 06/16/2013  . PULMONARY FIBROSIS, POSTINFLAMMATORY 09/20/2007  . COLONIC POLYPS, HX OF 10/21/2006    Current Outpatient Medications on File Prior to Visit  Medication Sig Dispense Refill  . fexofenadine (ALLEGRA) 180 MG tablet Take 180 mg by mouth daily.    . diazepam (VALIUM) 5 MG tablet TAKE 1 TABLET EVERY 12 HOURS AS NEEDED FOR ANXIETY (Patient not taking: Reported on 04/23/2017) 30 tablet 0  . EPINEPHrine 0.3 mg/0.3 mL IJ SOAJ injection Inject 0.3 mLs (0.3 mg total) into the muscle once. (Patient not taking: Reported on 09/14/2016) 1 Device 3  . hydroxychloroquine (PLAQUENIL) 200 MG tablet     . naltrexone (DEPADE) 50 MG tablet Take 1 tablet (50 mg total) by mouth daily. (Patient not taking: Reported on 04/23/2017)    . zolpidem (AMBIEN) 5 MG tablet Take 1 tablet (5 mg total) by mouth at bedtime as needed for sleep. (Patient not taking: Reported on 09/14/2016) 30 tablet 0   No current facility-administered medications on file prior to visit.     Past Medical History:  Diagnosis Date  . Anemia    past H/o anemia  . Anxiety    on  effexor and valium in the past  . Dyspnea    seeing pulm, hx lupus pneumonitis, mild pulm fibrosis  . Eczema   . Fibromyalgia 01/18/2008   Qualifier: Diagnosis of  By: Yetta Barre MD, Bernadene Bell.   Marland Kitchen GERD 01/11/2008   Qualifier: Diagnosis of  By: Yetta Barre MD, Bernadene Bell.   . H/O thyroid nodule 01/14/2016  . Hyperglycemia    borderline  . Infertility, female   . Thyroid disease    past h/o hypothyroidism    Past Surgical History:  Procedure Laterality Date  . ABDOMINAL HYSTERECTOMY  2004   for fibroid; retained ovaries  . PELVIC LAPAROSCOPY  1999    Social History   Socioeconomic History  . Marital status: Married    Spouse  name: Not on file  . Number of children: Not on file  . Years of education: Not on file  . Highest education level: Not on file  Occupational History  . Not on file  Social Needs  . Financial resource strain: Not on file  . Food insecurity:    Worry: Not on file    Inability: Not on file  . Transportation needs:    Medical: Not on file    Non-medical: Not on file  Tobacco Use  . Smoking status: Never Smoker  . Smokeless tobacco: Never Used  Substance and Sexual Activity  . Alcohol use: Yes    Alcohol/week: 1.0 oz    Types: 2 Standard drinks or equivalent per week    Comment: occ glass of wine  . Drug use: No  . Sexual activity: Yes    Partners: Male  Lifestyle  . Physical activity:    Days per week: Not on file    Minutes per session: Not on file  . Stress: Not on file  Relationships  . Social connections:    Talks on phone: Not on file    Gets together: Not on file    Attends religious service: Not on file    Active member of club or organization: Not on file    Attends meetings of clubs or organizations: Not on file    Relationship status: Not on file  Other Topics Concern  . Not on file  Social History Narrative   Work or School: not working      Home Situation: lives with husband      Spiritual Beliefs: Christian      Lifestyle: eats healthy, not exercising       Family History  Problem Relation Age of Onset  . Diabetes Mother   . Diabetes Father   . Stroke Father   . Anuerysm Father   . Sudden death Brother   . Heart attack Brother   . Diabetes Paternal Grandmother   . Breast cancer Maternal Grandmother   . Asthma Neg Hx   . Allergic rhinitis Neg Hx   . Immunodeficiency Neg Hx   . Urticaria Neg Hx   . Eczema Neg Hx   . Atopy Neg Hx   . Angioedema Neg Hx     Review of Systems  Constitutional: Negative for chills and fever.  Respiratory: Negative for cough, shortness of breath and wheezing.   Cardiovascular: Negative for chest pain,  palpitations and leg swelling.  Gastrointestinal: Negative for abdominal pain, constipation, diarrhea and nausea.       Gerd  Genitourinary: Positive for dysuria. Negative for frequency and hematuria.  Neurological: Positive for numbness (pins and needles in hands and feet). Negative for tremors,  weakness, light-headedness and headaches.       Objective:   Vitals:   05/10/17 1345  BP: 126/84  Pulse: 67  Resp: 16  Temp: 98 F (36.7 C)  SpO2: 98%   BP Readings from Last 3 Encounters:  05/10/17 126/84  04/23/17 112/70  09/14/16 118/70   Wt Readings from Last 3 Encounters:  05/10/17 155 lb (70.3 kg)  04/23/17 156 lb 3.2 oz (70.9 kg)  09/14/16 147 lb (66.7 kg)   Body mass index is 25.02 kg/m.   Physical Exam    Constitutional: Appears well-developed and well-nourished. No distress.  HENT:  Head: Normocephalic and atraumatic.  Neck: Neck supple. No tracheal deviation present. No thyromegaly present.  No cervical lymphadenopathy Cardiovascular: Normal rate, regular rhythm and normal heart sounds.   No murmur heard. No carotid bruit .  No edema Pulmonary/Chest: Effort normal and breath sounds normal. No respiratory distress. No has no wheezes. No rales. Musculoskeletal: No deformity, no swollen joints, normal sensation all extremities Neurological: Normal strength all extremities, gait normal Skin: Skin is warm and dry. Not diaphoretic.  Psychiatric: Normal mood and affect. Behavior is normal.      Assessment & Plan:    See Problem List for Assessment and Plan of chronic medical problems.

## 2017-05-10 ENCOUNTER — Other Ambulatory Visit (INDEPENDENT_AMBULATORY_CARE_PROVIDER_SITE_OTHER): Payer: BLUE CROSS/BLUE SHIELD

## 2017-05-10 ENCOUNTER — Ambulatory Visit: Payer: BLUE CROSS/BLUE SHIELD | Admitting: Internal Medicine

## 2017-05-10 ENCOUNTER — Encounter: Payer: Self-pay | Admitting: Internal Medicine

## 2017-05-10 ENCOUNTER — Other Ambulatory Visit: Payer: Self-pay | Admitting: Internal Medicine

## 2017-05-10 VITALS — BP 126/84 | HR 67 | Temp 98.0°F | Resp 16 | Wt 155.0 lb

## 2017-05-10 DIAGNOSIS — E063 Autoimmune thyroiditis: Secondary | ICD-10-CM

## 2017-05-10 DIAGNOSIS — E7800 Pure hypercholesterolemia, unspecified: Secondary | ICD-10-CM | POA: Diagnosis not present

## 2017-05-10 DIAGNOSIS — R300 Dysuria: Secondary | ICD-10-CM | POA: Diagnosis not present

## 2017-05-10 DIAGNOSIS — R202 Paresthesia of skin: Secondary | ICD-10-CM

## 2017-05-10 DIAGNOSIS — E559 Vitamin D deficiency, unspecified: Secondary | ICD-10-CM | POA: Diagnosis not present

## 2017-05-10 DIAGNOSIS — R739 Hyperglycemia, unspecified: Secondary | ICD-10-CM | POA: Diagnosis not present

## 2017-05-10 LAB — CBC WITH DIFFERENTIAL/PLATELET
BASOS PCT: 1.3 % (ref 0.0–3.0)
Basophils Absolute: 0.1 10*3/uL (ref 0.0–0.1)
EOS PCT: 1.5 % (ref 0.0–5.0)
Eosinophils Absolute: 0.1 10*3/uL (ref 0.0–0.7)
HEMATOCRIT: 36.5 % (ref 36.0–46.0)
HEMOGLOBIN: 12.5 g/dL (ref 12.0–15.0)
LYMPHS PCT: 27.9 % (ref 12.0–46.0)
Lymphs Abs: 1.3 10*3/uL (ref 0.7–4.0)
MCHC: 34.2 g/dL (ref 30.0–36.0)
MCV: 87.6 fl (ref 78.0–100.0)
Monocytes Absolute: 0.4 10*3/uL (ref 0.1–1.0)
Monocytes Relative: 9.4 % (ref 3.0–12.0)
Neutro Abs: 2.8 10*3/uL (ref 1.4–7.7)
Neutrophils Relative %: 59.9 % (ref 43.0–77.0)
Platelets: 164 10*3/uL (ref 150.0–400.0)
RBC: 4.16 Mil/uL (ref 3.87–5.11)
RDW: 13.6 % (ref 11.5–15.5)
WBC: 4.6 10*3/uL (ref 4.0–10.5)

## 2017-05-10 LAB — COMPREHENSIVE METABOLIC PANEL
ALBUMIN: 4.6 g/dL (ref 3.5–5.2)
ALK PHOS: 47 U/L (ref 39–117)
ALT: 15 U/L (ref 0–35)
AST: 21 U/L (ref 0–37)
BUN: 8 mg/dL (ref 6–23)
CALCIUM: 10 mg/dL (ref 8.4–10.5)
CHLORIDE: 100 meq/L (ref 96–112)
CO2: 29 mEq/L (ref 19–32)
CREATININE: 0.91 mg/dL (ref 0.40–1.20)
GFR: 84.18 mL/min (ref >60.00)
Glucose, Bld: 60 mg/dL — ABNORMAL LOW (ref 70–99)
POTASSIUM: 3.8 meq/L (ref 3.5–5.1)
Sodium: 139 mEq/L (ref 135–145)
Total Bilirubin: 1 mg/dL (ref 0.2–1.2)
Total Protein: 7.4 g/dL (ref 6.0–8.3)

## 2017-05-10 LAB — URINALYSIS, ROUTINE W REFLEX MICROSCOPIC
BILIRUBIN URINE: NEGATIVE
KETONES UR: NEGATIVE
Nitrite: NEGATIVE
PH: 7.5 (ref 5.0–8.0)
Specific Gravity, Urine: <=1.005 — AB (ref 1.000–1.030)
Total Protein, Urine: NEGATIVE
UROBILINOGEN UA: 0.2 (ref 0.0–1.0)
Urine Glucose: NEGATIVE

## 2017-05-10 LAB — VITAMIN D 25 HYDROXY (VIT D DEFICIENCY, FRACTURES): VITD: 38.99 ng/mL (ref 30.00–100.00)

## 2017-05-10 LAB — LIPID PANEL
CHOLESTEROL: 192 mg/dL (ref 0–200)
HDL: 76.4 mg/dL (ref >39.00)
LDL CALC: 107 mg/dL — AB (ref 0–99)
NonHDL: 116.04
TRIGLYCERIDES: 44 mg/dL (ref 0.0–149.0)
Total CHOL/HDL Ratio: 3
VLDL: 8.8 mg/dL (ref 0.0–40.0)

## 2017-05-10 LAB — HEMOGLOBIN A1C: Hgb A1c MFr Bld: 5.5 % (ref 4.6–6.5)

## 2017-05-10 LAB — TSH: TSH: 0.85 u[IU]/mL (ref 0.35–4.50)

## 2017-05-10 LAB — VITAMIN B12: VITAMIN B 12: 1043 pg/mL — AB (ref 211–911)

## 2017-05-10 MED ORDER — DIAZEPAM 5 MG PO TABS: ORAL_TABLET | ORAL | 0 refills | Status: DC

## 2017-05-10 NOTE — Patient Instructions (Signed)
  Test(s) ordered today. Your results will be released to MyChart (or called to you) after review, usually within 72hours after test completion. If any changes need to be made, you will be notified at that same time.    Medications reviewed and updated.  No changes recommended at this time.     

## 2017-05-10 NOTE — Assessment & Plan Note (Signed)
Was seen at urgent care and was diagnosed with a UTI-unaware of culture results, but she will call and follow-up with urgent care She is taking the antibiotic, but still experiencing dysuria Recheck urinalysis, urine culture Decrease acidic food intake

## 2017-05-10 NOTE — Assessment & Plan Note (Signed)
Check lipid panel, CMP Has not been eating as healthy

## 2017-05-10 NOTE — Assessment & Plan Note (Signed)
Check A1c check A1c Low sugar/carbohydrate diet Regular exercise encouraged

## 2017-05-10 NOTE — Assessment & Plan Note (Signed)
Typically occurs after eating ?  Cause Will check B12 level and basic blood work If symptoms do not resolve can consider neurology evaluation

## 2017-05-10 NOTE — Assessment & Plan Note (Signed)
Taking vitamin D daily, but concerned she is not absorbing it Will check vitamin D level

## 2017-05-10 NOTE — Assessment & Plan Note (Addendum)
Has not seen endo in a while, but will follow-up in a few months Given current symptoms we will check TSH-we will let endocrine management if needed Currently she is not taking any medication

## 2017-05-12 LAB — URINE CULTURE
MICRO NUMBER:: 90549333
SPECIMEN QUALITY: ADEQUATE

## 2017-05-13 ENCOUNTER — Encounter: Payer: Self-pay | Admitting: Internal Medicine

## 2017-05-27 ENCOUNTER — Encounter: Payer: Self-pay | Admitting: Internal Medicine

## 2017-05-27 ENCOUNTER — Ambulatory Visit: Payer: BLUE CROSS/BLUE SHIELD | Admitting: Internal Medicine

## 2017-05-27 VITALS — BP 112/78 | HR 69 | Temp 98.7°F | Resp 16 | Wt 153.0 lb

## 2017-05-27 DIAGNOSIS — K219 Gastro-esophageal reflux disease without esophagitis: Secondary | ICD-10-CM | POA: Diagnosis not present

## 2017-05-27 MED ORDER — RANITIDINE HCL 150 MG PO TABS: 150.0000 mg | ORAL_TABLET | Freq: Two times a day (BID) | ORAL | 5 refills | Status: DC

## 2017-05-27 MED ORDER — EPINEPHRINE 0.3 MG/0.3ML IJ SOAJ: 0.3000 mg | Freq: Once | INTRAMUSCULAR | 3 refills | Status: AC

## 2017-05-27 NOTE — Patient Instructions (Addendum)
Call and schedule your mammogram -  [facility] 7 a.m.-6:30 p.m., Monday 7 a.m.-5 p.m., Tuesday-Friday Schedule an appointment by calling [phone]   Start taking zantac 150 mg twice daily.  Taper off the medication slowly once your symptoms are controlled.      Gastroesophageal Reflux Disease, Adult Normally, food travels down the esophagus and stays in the stomach to be digested. However, when a person has gastroesophageal reflux disease (GERD), food and stomach acid move back up into the esophagus. When this happens, the esophagus becomes sore and inflamed. Over time, GERD can create small holes (ulcers) in the lining of the esophagus. What are the causes? This condition is caused by a problem with the muscle between the esophagus and the stomach (lower esophageal sphincter, or LES). Normally, the LES muscle closes after food passes through the esophagus to the stomach. When the LES is weakened or abnormal, it does not close properly, and that allows food and stomach acid to go back up into the esophagus. The LES can be weakened by certain dietary substances, medicines, and medical conditions, including:  Tobacco use.  Pregnancy.  Having a hiatal hernia.  Heavy alcohol use.  Certain foods and beverages, such as coffee, chocolate, onions, and peppermint.  What increases the risk? This condition is more likely to develop in:  People who have an increased body weight.  People who have connective tissue disorders.  People who use NSAID medicines.  What are the signs or symptoms? Symptoms of this condition include:  Heartburn.  Difficult or painful swallowing.  The feeling of having a lump in the throat.  Abitter taste in the mouth.  Bad breath.  Having a large amount of saliva.  Having an upset or bloated stomach.  Belching.  Chest pain.  Shortness of breath or wheezing.  Ongoing (chronic) cough or a night-time  cough.  Wearing away of tooth enamel.  Weight loss.  Different conditions can cause chest pain. Make sure to see your health care provider if you experience chest pain. How is this diagnosed? Your health care provider will take a medical history and perform a physical exam. To determine if you have mild or severe GERD, your health care provider may also monitor how you respond to treatment. You may also have other tests, including:  An endoscopy toexamine your stomach and esophagus with a small camera.  A test thatmeasures the acidity level in your esophagus.  A test thatmeasures how much pressure is on your esophagus.  A barium swallow or modified barium swallow to show the shape, size, and functioning of your esophagus.  How is this treated? The goal of treatment is to help relieve your symptoms and to prevent complications. Treatment for this condition may vary depending on how severe your symptoms are. Your health care provider may recommend:  Changes to your diet.  Medicine.  Surgery.  Follow these instructions at home: Diet  Follow a diet as recommended by your health care provider. This may involve avoiding foods and drinks such as: ? Coffee and tea (with or without caffeine). ? Drinks that containalcohol. ? Energy drinks and sports drinks. ? Carbonated drinks or sodas. ? Chocolate and cocoa. ? Peppermint and mint flavorings. ? Garlic and onions. ? Horseradish. ? Spicy and acidic foods, including peppers, chili powder, curry powder, vinegar, hot sauces, and barbecue sauce. ? Citrus fruit juices and citrus fruits, such as oranges, lemons, and limes. ? Tomato-based foods, such as red sauce, chili,  salsa, and pizza with red sauce. ? Fried and fatty foods, such as donuts, french fries, potato chips, and high-fat dressings. ? High-fat meats, such as hot dogs and fatty cuts of red and white meats, such as rib eye steak, sausage, ham, and bacon. ? High-fat dairy items,  such as whole milk, butter, and cream cheese.  Eat small, frequent meals instead of large meals.  Avoid drinking large amounts of liquid with your meals.  Avoid eating meals during the 2-3 hours before bedtime.  Avoid lying down right after you eat.  Do not exercise right after you eat. General instructions  Pay attention to any changes in your symptoms.  Take over-the-counter and prescription medicines only as told by your health care provider. Do not take aspirin, ibuprofen, or other NSAIDs unless your health care provider told you to do so.  Do not use any tobacco products, including cigarettes, chewing tobacco, and e-cigarettes. If you need help quitting, ask your health care provider.  Wear loose-fitting clothing. Do not wear anything tight around your waist that causes pressure on your abdomen.  Raise (elevate) the head of your bed 6 inches (15cm).  Try to reduce your stress, such as with yoga or meditation. If you need help reducing stress, ask your health care provider.  If you are overweight, reduce your weight to an amount that is healthy for you. Ask your health care provider for guidance about a safe weight loss goal.  Keep all follow-up visits as told by your health care provider. This is important. Contact a health care provider if:  You have new symptoms.  You have unexplained weight loss.  You have difficulty swallowing, or it hurts to swallow.  You have wheezing or a persistent cough.  Your symptoms do not improve with treatment.  You have a hoarse voice. Get help right away if:  You have pain in your arms, neck, jaw, teeth, or back.  You feel sweaty, dizzy, or light-headed.  You have chest pain or shortness of breath.  You vomit and your vomit looks like blood or coffee grounds.  You faint.  Your stool is bloody or black.  You cannot swallow, drink, or eat. This information is not intended to replace advice given to you by your health care  provider. Make sure you discuss any questions you have with your health care provider. Document Released: 10/01/2004 Document Revised: 05/22/2015 Document Reviewed: 04/18/2014 Elsevier Interactive Patient Education  [employer].

## 2017-05-27 NOTE — Progress Notes (Signed)
Subjective:    Patient ID: Donna Blankenship, female    DOB: [birth year] Blankens[birth year]  MRN: [id number]  HPI The patient is here for an acute visit.   Excessive stomach acid:  For several weeks she has had increased acid.  She is seeing a chinese accupuncturist to try to hep.  She also feels hypersensitivity in her fingers and toes.  She also has some discomfort in her anterior lower legs, which she feels is all related to having too much acid throughout her system.  She did drink cranberry juice not long ago and it burned when she drank it, but she denies any difficulty swallowing, nausea or reflux.  She is trying to control the acid with diet, but has been unsuccessful.  She has not tried any acid reducing medication.    Medications and allergies reviewed with patient and updated if appropriate.  Patient Active Problem List   Diagnosis Date Noted  . Dysuria 05/10/2017  . Tingling in extremities 05/10/2017  . Anaphylactic shock due to adverse food reaction 12/24/2015  . Other allergic rhinitis 12/24/2015  . Lactose intolerance 12/24/2015  . Esophageal dysphagia 12/24/2015  . Vitamin D deficiency 12/11/2015  . Gastroesophageal reflux disease 12/11/2015  . Axillary lump 08/29/2015  . Paresthesia of both hands 05/25/2015  . Hashimoto's thyroiditis 02/27/2015  . Elevated lipoprotein(a) 02/27/2015  . Insomnia 02/27/2015  . Thyroid nodule 12/19/2014  . Eczema 12/19/2014  . Food allergy 12/19/2014  . Hypercalcemia 12/19/2014  . Lichen planus 11/13/2014  . GAD (generalized anxiety disorder) 08/13/2014  . Dyspnea 04/20/2014  . Hyperglycemia 01/01/2014  . Pure hypercholesterolemia 06/16/2013  . PULMONARY FIBROSIS, POSTINFLAMMATORY 09/20/2007  . COLONIC POLYPS, HX OF 10/21/2006    Current Outpatient Medications on File Prior to Visit  Medication Sig Dispense Refill  . diazepam (VALIUM) 5 MG tablet TAKE 1 TABLET EVERY 12 HOURS AS NEEDED FOR ANXIETY 30 tablet 0  . fexofenadine (ALLEGRA)  180 MG tablet Take 180 mg by mouth daily.    . hydroxychloroquine (PLAQUENIL) 200 MG tablet      No current facility-administered medications on file prior to visit.     Past Medical History:  Diagnosis Date  . Anemia    past H/o anemia  . Anxiety    on effexor and valium in the past  . Dyspnea    seeing pulm, hx lupus pneumonitis, mild pulm fibrosis  . Eczema   . Fibromyalgia 01/18/2008   Qualifier: Diagnosis of  By: Yetta Barre MD, Bernadene Bell.   Marland Kitchen GERD 01/11/2008   Qualifier: Diagnosis of  By: Yetta Barre MD, Bernadene Bell.   . H/O thyroid nodule 01/14/2016  . Hyperglycemia    borderline  . Infertility, female   . Thyroid disease    past h/o hypothyroidism    Past Surgical History:  Procedure Laterality Date  . ABDOMINAL HYSTERECTOMY  2004   for fibroid; retained ovaries  . PELVIC LAPAROSCOPY  1999    Social History   Socioeconomic History  . Marital status: Married    Spouse name: Not on file  . Number of children: Not on file  . Years of education: Not on file  . Highest education level: Not on file  Occupational History  . Not on file  Social Needs  . Financial resource strain: Not on file  . Food insecurity:    Worry: Not on file    Inability: Not on file  . Transportation needs:    Medical: Not on file  Non-medical: Not on file  Tobacco Use  . Smoking status: Never Smoker  . Smokeless tobacco: Never Used  Substance and Sexual Activity  . Alcohol use: Yes    Alcohol/week: 1.0 oz    Types: 2 Standard drinks or equivalent per week    Comment: occ glass of wine  . Drug use: No  . Sexual activity: Yes    Partners: Male  Lifestyle  . Physical activity:    Days per week: Not on file    Minutes per session: Not on file  . Stress: Not on file  Relationships  . Social connections:    Talks on phone: Not on file    Gets together: Not on file    Attends religious service: Not on file    Active member of club or organization: Not on file    Attends meetings of clubs or  organizations: Not on file    Relationship status: Not on file  Other Topics Concern  . Not on file  Social History Narrative   Work or School: not working      Home Situation: lives with husband      Spiritual Beliefs: Christian      Lifestyle: eats healthy, not exercising       Family History  Problem Relation Age of Onset  . Diabetes Mother   . Diabetes Father   . Stroke Father   . Anuerysm Father   . Sudden death Brother   . Heart attack Brother   . Diabetes Paternal Grandmother   . Breast cancer Maternal Grandmother   . Asthma Neg Hx   . Allergic rhinitis Neg Hx   . Immunodeficiency Neg Hx   . Urticaria Neg Hx   . Eczema Neg Hx   . Atopy Neg Hx   . Angioedema Neg Hx     Review of Systems  Constitutional: Negative for chills and fever.  Cardiovascular: Negative for chest pain.  Gastrointestinal: Positive for abdominal pain (discomfort). Negative for blood in stool, constipation (chronic), diarrhea and nausea.       No reflux, burning with going down in throat (cranberry juice only)       Objective:   Vitals:   05/27/17 0959  BP: 112/78  Pulse: 69  Resp: 16  Temp: 98.7 F (37.1 C)  SpO2: 98%   BP Readings from Last 3 Encounters:  05/27/17 112/78  05/10/17 126/84  04/23/17 112/70   Wt Readings from Last 3 Encounters:  05/27/17 153 lb (69.4 kg)  05/10/17 155 lb (70.3 kg)  04/23/17 156 lb 3.2 oz (70.9 kg)   Body mass index is 24.69 kg/m.   Physical Exam  Constitutional: She appears well-developed and well-nourished. No distress.  HENT:  Head: Normocephalic and atraumatic.  Skin: Skin is warm and dry. She is not diaphoretic.  Psychiatric: She has a normal mood and affect.           Assessment & Plan:   15 minutes were spent face-to-face with the patient, over 50% of which was spent counseling regarding her diagnosis of atypical heartburn causes, and dietary changes and lifestyle changes and medication choices that may help.  Started her  on any medication-Zantac and discussed tapering off the medication and how to take it appropriately in the future.   See Problem List for Assessment and Plan of chronic medical problems.

## 2017-05-27 NOTE — Assessment & Plan Note (Signed)
Excessive acid in the gastrointestinal system consistent with atypical GERD Natural remedies has not been effective She has revised her diet Recommend ranitidine 150 mg twice daily-once symptoms are controlled she can try decreasing to once daily and then eventually tapering off.  Okay to take as needed in the future GERD diet Keep stress level to a minimum She did find that the other day after taking Valium it seemed to help-the may be some stressed induced atypical GERD Call if no improvement

## 2017-07-06 ENCOUNTER — Telehealth: Payer: Self-pay | Admitting: Internal Medicine

## 2017-07-06 ENCOUNTER — Other Ambulatory Visit: Payer: Self-pay | Admitting: Internal Medicine

## 2017-07-06 NOTE — Telephone Encounter (Signed)
[place] Controlled Substance Database checked. Last filled on 05/10/17 

## 2017-07-06 NOTE — Telephone Encounter (Signed)
Copied from CRM [id number]. Topic: Quick Communication - Rx Refill/Question >> Jul 06, 2017 12:21 PM Gean BirchwoodWilliams-Neal, Sade R wrote: Medication:  diazepam (VALIUM) 5 MG tablet   Has the patient contacted their pharmacy? Yes (Agent: If no, request that the patient contact the pharmacy for the refill.) (Agent: If yes, when and what did the pharmacy advise?)  Preferred Pharmacy (with phone number or street name): [id number], [place] - [address] [phone] ([phone] (Fax)      Agent: Please be advised that RX refills may take up to 3 business days. We ask that you follow-up with your pharmacy.

## 2017-07-31 NOTE — Progress Notes (Deleted)
Subjective:    Patient ID: Donna Blankenship, female    DOB: [birth year], 52 y.o.   MRN: [id number]  HPI The patient is here for follow up.  Excessive acid in system:  She started zantac 150 mg BID two months ago.    Medications and allergies reviewed with patient and updated if appropriate.  Patient Active Problem List   Diagnosis Date Noted  . Dysuria 05/10/2017  . Tingling in extremities 05/10/2017  . Anaphylactic shock due to adverse food reaction 12/24/2015  . Other allergic rhinitis 12/24/2015  . Lactose intolerance 12/24/2015  . Esophageal dysphagia 12/24/2015  . Vitamin D deficiency 12/11/2015  . Gastroesophageal reflux disease 12/11/2015  . Axillary lump 08/29/2015  . Paresthesia of both hands 05/25/2015  . Hashimoto's thyroiditis 02/27/2015  . Elevated lipoprotein(a) 02/27/2015  . Insomnia 02/27/2015  . Thyroid nodule 12/19/2014  . Eczema 12/19/2014  . Food allergy 12/19/2014  . Hypercalcemia 12/19/2014  . Lichen planus 11/13/2014  . GAD (generalized anxiety disorder) 08/13/2014  . Dyspnea 04/20/2014  . Hyperglycemia 01/01/2014  . Pure hypercholesterolemia 06/16/2013  . PULMONARY FIBROSIS, POSTINFLAMMATORY 09/20/2007  . COLONIC POLYPS, HX OF 10/21/2006    Current Outpatient Medications on File Prior to Visit  Medication Sig Dispense Refill  . diazepam (VALIUM) 5 MG tablet TAKE 1 TABLET EVERY 12 HOURS AS NEEDED FOR ANXIETY 30 tablet 0  . fexofenadine (ALLEGRA) 180 MG tablet Take 180 mg by mouth daily.    . hydroxychloroquine (PLAQUENIL) 200 MG tablet     . ranitidine (ZANTAC) 150 MG tablet Take 1 tablet (150 mg total) by mouth 2 (two) times daily. 60 tablet 5   No current facility-administered medications on file prior to visit.     Past Medical History:  Diagnosis Date  . Anemia    past H/o anemia  . Anxiety    on effexor and valium in the past  . Dyspnea    seeing pulm, hx lupus pneumonitis, mild pulm fibrosis  . Eczema   . Fibromyalgia 01/18/2008     Qualifier: Diagnosis of  By: Yetta BarreJones MD, Bernadene Bellhomas L.   Marland Kitchen. GERD 01/11/2008   Qualifier: Diagnosis of  By: Yetta BarreJones MD, Bernadene Bellhomas L.   . H/O thyroid nodule 01/14/2016  . Hyperglycemia    borderline  . Infertility, female   . Thyroid disease    past h/o hypothyroidism    Past Surgical History:  Procedure Laterality Date  . ABDOMINAL HYSTERECTOMY  2004   for fibroid; retained ovaries  . PELVIC LAPAROSCOPY  1999    Social History   Socioeconomic History  . Marital status: Married    Spouse name: Not on file  . Number of children: Not on file  . Years of education: Not on file  . Highest education level: Not on file  Occupational History  . Not on file  Social Needs  . Financial resource strain: Not on file  . Food insecurity:    Worry: Not on file    Inability: Not on file  . Transportation needs:    Medical: Not on file    Non-medical: Not on file  Tobacco Use  . Smoking status: Never Smoker  . Smokeless tobacco: Never Used  Substance and Sexual Activity  . Alcohol use: Yes    Alcohol/week: 1.2 oz    Types: 2 Standard drinks or equivalent per week    Comment: occ glass of wine  . Drug use: No  . Sexual activity: Yes    Partners: Male  Lifestyle  . Physical activity:    Days per week: Not on file    Minutes per session: Not on file  . Stress: Not on file  Relationships  . Social connections:    Talks on phone: Not on file    Gets together: Not on file    Attends religious service: Not on file    Active member of club or organization: Not on file    Attends meetings of clubs or organizations: Not on file    Relationship status: Not on file  Other Topics Concern  . Not on file  Social History Narrative   Work or School: not working      Home Situation: lives with husband      Spiritual Beliefs: Christian      Lifestyle: eats healthy, not exercising       Family History  Problem Relation Age of Onset  . Diabetes Mother   . Diabetes Father   . Stroke Father    . Anuerysm Father   . Sudden death Brother   . Heart attack Brother   . Diabetes Paternal Grandmother   . Breast cancer Maternal Grandmother   . Asthma Neg Hx   . Allergic rhinitis Neg Hx   . Immunodeficiency Neg Hx   . Urticaria Neg Hx   . Eczema Neg Hx   . Atopy Neg Hx   . Angioedema Neg Hx     Review of Systems     Objective:  There were no vitals filed for this visit. BP Readings from Last 3 Encounters:  05/27/17 112/78  05/10/17 126/84  04/23/17 112/70   Wt Readings from Last 3 Encounters:  05/27/17 153 lb (69.4 kg)  05/10/17 155 lb (70.3 kg)  04/23/17 156 lb 3.2 oz (70.9 kg)   There is no height or weight on file to calculate BMI.   Physical Exam    Constitutional: Appears well-developed and well-nourished. No distress.  HENT:  Head: Normocephalic and atraumatic.  Neck: Neck supple. No tracheal deviation present. No thyromegaly present.  No cervical lymphadenopathy Cardiovascular: Normal rate, regular rhythm and normal heart sounds.   No murmur heard. No carotid bruit .  No edema Pulmonary/Chest: Effort normal and breath sounds normal. No respiratory distress. No has no wheezes. No rales.  Skin: Skin is warm and dry. Not diaphoretic.  Psychiatric: Normal mood and affect. Behavior is normal.      Assessment & Plan:    See Problem List for Assessment and Plan of chronic medical problems.

## 2017-08-02 ENCOUNTER — Ambulatory Visit: Payer: BLUE CROSS/BLUE SHIELD | Admitting: Internal Medicine

## 2017-08-02 DIAGNOSIS — Z0289 Encounter for other administrative examinations: Secondary | ICD-10-CM

## 2017-08-08 NOTE — Progress Notes (Signed)
Subjective:    Patient ID: Donna Blankenship, female    DOB: 06-19-1965, 52 y.o.   MRN: [id number]  HPI The patient is here for follow up.  Excessive acid in system/GERD:  She started zantac 150 mg BID two months ago and it is not helping.  She is taking a lot of natural things as well and seeing an acupuncturist and nothing is helping.  She is compliant with a GERD diet.  She has gained weight.  She feels like she needs to eat frequently to help settle her stomach.  Once she eats all of her symptoms are better.   When she urinates it Rishan Oyama and feels like she has too much acid in her system.  Her stool smells like there is too much acid in it.  She has seen darker stools at times.  She has bilateral lower back pain and feels like her kidneys are working too much.   Eating a full meal helps - she feels better.  Her lower back pain is not tender to palpation.  She denies any blood in the urine.  She has a tight uncomfortable feeling around her rib cage/upper abdomen.  She has needed to get extenders for her bra because they are too tight.  She was concerned that some of the sensation was related to a possible hiatal hernia.  Lichen planus: She does follow with dermatology and takes her hydroxychloroquine as prescribed.  She states the rash on her scalp has been well controlled and not bothering her.  She now has the rash on her neck and that has bothering her a great deal.  She is concerned that her autoimmune disease has flared up.  Medications and allergies reviewed with patient and updated if appropriate.  Patient Active Problem List   Diagnosis Date Noted  . Dysuria 05/10/2017  . Tingling in extremities 05/10/2017  . Anaphylactic shock due to adverse food reaction 12/24/2015  . Other allergic rhinitis 12/24/2015  . Lactose intolerance 12/24/2015  . Esophageal dysphagia 12/24/2015  . Vitamin D deficiency 12/11/2015  . Gastroesophageal reflux disease 12/11/2015  . Axillary lump  08/29/2015  . Paresthesia of both hands 05/25/2015  . Hashimoto's thyroiditis 02/27/2015  . Elevated lipoprotein(a) 02/27/2015  . Insomnia 02/27/2015  . Thyroid nodule 12/19/2014  . Eczema 12/19/2014  . Food allergy 12/19/2014  . Hypercalcemia 12/19/2014  . Lichen planus 11/13/2014  . GAD (generalized anxiety disorder) 08/13/2014  . Dyspnea 04/20/2014  . Hyperglycemia 01/01/2014  . Pure hypercholesterolemia 06/16/2013  . PULMONARY FIBROSIS, POSTINFLAMMATORY 09/20/2007  . COLONIC POLYPS, HX OF 10/21/2006    Current Outpatient Medications on File Prior to Visit  Medication Sig Dispense Refill  . diazepam (VALIUM) 5 MG tablet TAKE 1 TABLET EVERY 12 HOURS AS NEEDED FOR ANXIETY 30 tablet 0  . fexofenadine (ALLEGRA) 180 MG tablet Take 180 mg by mouth daily.    . hydroxychloroquine (PLAQUENIL) 200 MG tablet     . ranitidine (ZANTAC) 150 MG tablet Take 1 tablet (150 mg total) by mouth 2 (two) times daily. 60 tablet 5   No current facility-administered medications on file prior to visit.     Past Medical History:  Diagnosis Date  . Anemia    past H/o anemia  . Anxiety    on effexor and valium in the past  . Dyspnea    seeing pulm, hx lupus pneumonitis, mild pulm fibrosis  . Eczema   . Fibromyalgia 01/18/2008   Qualifier: Diagnosis of  By: Yetta Barre  MD, Bernadene Bell.   Marland Kitchen GERD 01/11/2008   Qualifier: Diagnosis of  By: Yetta Barre MD, Bernadene Bell.   . H/O thyroid nodule 01/14/2016  . Hyperglycemia    borderline  . Infertility, female   . Thyroid disease    past h/o hypothyroidism    Past Surgical History:  Procedure Laterality Date  . ABDOMINAL HYSTERECTOMY  2004   for fibroid; retained ovaries  . PELVIC LAPAROSCOPY  1999    Social History   Socioeconomic History  . Marital status: Married    Spouse name: Not on file  . Number of children: Not on file  . Years of education: Not on file  . Highest education level: Not on file  Occupational History  . Not on file  Social Needs  .  Financial resource strain: Not on file  . Food insecurity:    Worry: Not on file    Inability: Not on file  . Transportation needs:    Medical: Not on file    Non-medical: Not on file  Tobacco Use  . Smoking status: Never Smoker  . Smokeless tobacco: Never Used  Substance and Sexual Activity  . Alcohol use: Yes    Alcohol/week: 1.2 oz    Types: 2 Standard drinks or equivalent per week    Comment: occ glass of wine  . Drug use: No  . Sexual activity: Yes    Partners: Male  Lifestyle  . Physical activity:    Days per week: Not on file    Minutes per session: Not on file  . Stress: Not on file  Relationships  . Social connections:    Talks on phone: Not on file    Gets together: Not on file    Attends religious service: Not on file    Active member of club or organization: Not on file    Attends meetings of clubs or organizations: Not on file    Relationship status: Not on file  Other Topics Concern  . Not on file  Social History Narrative   Work or School: not working      Home Situation: lives with husband      Spiritual Beliefs: Christian      Lifestyle: eats healthy, not exercising       Family History  Problem Relation Age of Onset  . Diabetes Mother   . Diabetes Father   . Stroke Father   . Anuerysm Father   . Sudden death Brother   . Heart attack Brother   . Diabetes Paternal Grandmother   . Breast cancer Maternal Grandmother   . Asthma Neg Hx   . Allergic rhinitis Neg Hx   . Immunodeficiency Neg Hx   . Urticaria Neg Hx   . Eczema Neg Hx   . Atopy Neg Hx   . Angioedema Neg Hx     Review of Systems  Constitutional: Negative for chills and fever.  Gastrointestinal: Positive for abdominal distention. Negative for abdominal pain.       Increased bowel movements  Genitourinary: Positive for dysuria (intermittent) and frequency (but drinks a lot). Negative for hematuria.  Musculoskeletal: Positive for back pain (lower back).       Objective:    Vitals:   08/09/17 1013  BP: 134/86  Pulse: 74  Resp: 16  SpO2: 98%   BP Readings from Last 3 Encounters:  08/09/17 134/86  05/27/17 112/78  05/10/17 126/84   Wt Readings from Last 3 Encounters:  08/09/17 169 lb (76.7 kg)  05/27/17 153 lb (69.4 kg)  05/10/17 155 lb (70.3 kg)   Body mass index is 27.28 kg/m.   Physical Exam    Constitutional: Appears well-developed and well-nourished. No distress.  HENT:  Head: Normocephalic and atraumatic.  Abdomen: Soft, nondistended, minimal tenderness suprapubic region without rebound or guarding, no hepatosplenomegaly Musculoskeletal/GU: No CVA tenderness bilaterally, no bilateral lower back pain with palpation, no lumbar spine tenderness Vascular: No edema Skin: Hyperpigmented fine rash neck-typical of her lichen planus     Assessment & Plan:    See Problem List for Assessment and Plan of chronic medical problems.

## 2017-08-09 ENCOUNTER — Encounter: Payer: Self-pay | Admitting: Internal Medicine

## 2017-08-09 ENCOUNTER — Ambulatory Visit: Payer: BLUE CROSS/BLUE SHIELD | Admitting: Internal Medicine

## 2017-08-09 ENCOUNTER — Other Ambulatory Visit (INDEPENDENT_AMBULATORY_CARE_PROVIDER_SITE_OTHER): Payer: BLUE CROSS/BLUE SHIELD

## 2017-08-09 VITALS — BP 134/86 | HR 74 | Resp 16 | Wt 169.0 lb

## 2017-08-09 DIAGNOSIS — L439 Lichen planus, unspecified: Secondary | ICD-10-CM | POA: Diagnosis not present

## 2017-08-09 DIAGNOSIS — R300 Dysuria: Secondary | ICD-10-CM

## 2017-08-09 DIAGNOSIS — K219 Gastro-esophageal reflux disease without esophagitis: Secondary | ICD-10-CM | POA: Diagnosis not present

## 2017-08-09 LAB — CBC WITH DIFFERENTIAL/PLATELET
BASOS PCT: 0.9 % (ref 0.0–3.0)
Basophils Absolute: 0 10*3/uL (ref 0.0–0.1)
EOS ABS: 0.2 10*3/uL (ref 0.0–0.7)
EOS PCT: 3.9 % (ref 0.0–5.0)
HEMATOCRIT: 39.1 % (ref 36.0–46.0)
HEMOGLOBIN: 13.3 g/dL (ref 12.0–15.0)
LYMPHS PCT: 21.2 % (ref 12.0–46.0)
Lymphs Abs: 1.1 10*3/uL (ref 0.7–4.0)
MCHC: 34 g/dL (ref 30.0–36.0)
MCV: 88.2 fl (ref 78.0–100.0)
Monocytes Absolute: 0.5 10*3/uL (ref 0.1–1.0)
Monocytes Relative: 8.9 % (ref 3.0–12.0)
Neutro Abs: 3.4 10*3/uL (ref 1.4–7.7)
Neutrophils Relative %: 65.1 % (ref 43.0–77.0)
Platelets: 146 10*3/uL — ABNORMAL LOW (ref 150.0–400.0)
RBC: 4.43 Mil/uL (ref 3.87–5.11)
RDW: 14.1 % (ref 11.5–15.5)
WBC: 5.3 10*3/uL (ref 4.0–10.5)

## 2017-08-09 LAB — COMPREHENSIVE METABOLIC PANEL
ALBUMIN: 4.5 g/dL (ref 3.5–5.2)
ALT: 13 U/L (ref 0–35)
AST: 16 U/L (ref 0–37)
Alkaline Phosphatase: 82 U/L (ref 39–117)
BUN: 19 mg/dL (ref 6–23)
CALCIUM: 10.3 mg/dL (ref 8.4–10.5)
CHLORIDE: 104 meq/L (ref 96–112)
CO2: 32 mEq/L (ref 19–32)
CREATININE: 1.1 mg/dL (ref 0.40–1.20)
GFR: 67.57 mL/min (ref >60.00)
Glucose, Bld: 95 mg/dL (ref 70–99)
Potassium: 4.2 mEq/L (ref 3.5–5.1)
Sodium: 142 mEq/L (ref 135–145)
TOTAL PROTEIN: 7.3 g/dL (ref 6.0–8.3)
Total Bilirubin: 0.8 mg/dL (ref 0.2–1.2)

## 2017-08-09 LAB — URINALYSIS, ROUTINE W REFLEX MICROSCOPIC
Bilirubin Urine: NEGATIVE
Ketones, ur: NEGATIVE
Leukocytes, UA: NEGATIVE
NITRITE: NEGATIVE
PH: 7.5 (ref 5.0–8.0)
RBC / HPF: NONE SEEN (ref 0–?)
SPECIFIC GRAVITY, URINE: 1.01 (ref 1.000–1.030)
Total Protein, Urine: NEGATIVE
Urine Glucose: NEGATIVE
Urobilinogen, UA: 0.2 (ref 0.0–1.0)
WBC UA: NONE SEEN (ref 0–?)

## 2017-08-09 LAB — C-REACTIVE PROTEIN: CRP: <0.1 mg/dL — ABNORMAL LOW (ref 0.5–20.0)

## 2017-08-09 LAB — SEDIMENTATION RATE: Sed Rate: 14 mm/hr (ref 0–30)

## 2017-08-09 MED ORDER — OMEPRAZOLE 20 MG PO CPDR: 20.0000 mg | DELAYED_RELEASE_CAPSULE | Freq: Every day | ORAL | 3 refills | Status: DC

## 2017-08-09 NOTE — Assessment & Plan Note (Signed)
Lichen planus on the scalp well-controlled Currently with rash on her neck that she is having difficulty controlling Following with dermatology and taking hydroxychloroquine Check CBC, CMP, CRP, ESR and ANA Follow-up with dermatology if no improvement

## 2017-08-09 NOTE — Assessment & Plan Note (Signed)
Not controlled with ranitidine 150 mg twice daily, natural herbal products, acupuncture She is compliant with a GERD diet Will start omeprazole 20 mg 30 minutes prior to a meal Will refer to GI

## 2017-08-09 NOTE — Patient Instructions (Addendum)
  Test(s) ordered today. Your results will be released to MyChart (or called to you) after review, usually within 72hours after test completion. If any changes need to be made, you will be notified at that same time.  Medications reviewed and updated.  Changes include starting omeprazole 20 mg daily - take 30 minutes prior to a meal.  Continue your zantac.    Your prescription(s) have been submitted to your pharmacy. Please take as directed and contact our office if you believe you are having problem(s) with the medication(s).  A referral was ordered for gastroenterology

## 2017-08-09 NOTE — Assessment & Plan Note (Signed)
Check UA, urine culture to rule out infection

## 2017-08-11 ENCOUNTER — Encounter: Payer: Self-pay | Admitting: Internal Medicine

## 2017-08-11 LAB — URINE CULTURE
MICRO NUMBER:: 90922596
Result:: NO GROWTH
SPECIMEN QUALITY: ADEQUATE

## 2017-08-11 LAB — ANTI-NUCLEAR AB-TITER (ANA TITER)

## 2017-08-11 LAB — ANA: ANA: POSITIVE — AB

## 2017-08-17 DIAGNOSIS — R194 Change in bowel habit: Secondary | ICD-10-CM | POA: Diagnosis not present

## 2017-08-17 DIAGNOSIS — K219 Gastro-esophageal reflux disease without esophagitis: Secondary | ICD-10-CM | POA: Diagnosis not present

## 2017-08-17 DIAGNOSIS — Z1211 Encounter for screening for malignant neoplasm of colon: Secondary | ICD-10-CM | POA: Diagnosis not present

## 2017-08-17 DIAGNOSIS — R140 Abdominal distension (gaseous): Secondary | ICD-10-CM | POA: Diagnosis not present

## 2017-08-18 ENCOUNTER — Telehealth: Payer: Self-pay | Admitting: Internal Medicine

## 2017-08-18 ENCOUNTER — Other Ambulatory Visit: Payer: Self-pay | Admitting: Internal Medicine

## 2017-08-18 NOTE — Telephone Encounter (Signed)
See refill encounter from 08/18/17

## 2017-08-18 NOTE — Telephone Encounter (Signed)
Check [place] registry last filled 07/06/2017../[email or web address]  

## 2017-08-18 NOTE — Telephone Encounter (Signed)
Diazepam (Valium) refill Last Refill:07/06/17 #30 Last OV: 08/09/17 PCP: Dr. Lawerance BachBurns [facility]  [address]

## 2017-08-18 NOTE — Telephone Encounter (Signed)
Copied from CRM [id number]. Topic: Quick Communication - Rx Refill/Question >> Aug 18, 2017  2:46 PM Marylen PontoMcneil, Ja-Kwan wrote: Medication: diazepam (VALIUM) 5 MG tablet  Has the patient contacted their pharmacy?  no  Preferred Pharmacy (with phone number or street name): [id number] [place], [place] - [address] [phone] (Fax)  Agent: Please be advised that RX refills may take up to 3 business days. We ask that you follow-up with your pharmacy.

## 2017-08-19 NOTE — Telephone Encounter (Signed)
[facility]

## 2017-09-12 NOTE — Progress Notes (Signed)
Subjective:    Patient ID: Donna Blankenship, female    DOB: May 22, 1965, 52 y.o.   MRN: [id number]  HPI     Medications and allergies reviewed with patient and updated if appropriate.  Patient Active Problem List   Diagnosis Date Noted  . Dysuria 05/10/2017  . Tingling in extremities 05/10/2017  . Anaphylactic shock due to adverse food reaction 12/24/2015  . Other allergic rhinitis 12/24/2015  . Lactose intolerance 12/24/2015  . Vitamin D deficiency 12/11/2015  . Gastroesophageal reflux disease 12/11/2015  . Axillary lump 08/29/2015  . Paresthesia of both hands 05/25/2015  . Hashimoto's thyroiditis 02/27/2015  . Elevated lipoprotein(a) 02/27/2015  . Insomnia 02/27/2015  . Thyroid nodule 12/19/2014  . Eczema 12/19/2014  . Food allergy 12/19/2014  . Hypercalcemia 12/19/2014  . Lichen planus 11/13/2014  . GAD (generalized anxiety disorder) 08/13/2014  . Dyspnea 04/20/2014  . Hyperglycemia 01/01/2014  . Pure hypercholesterolemia 06/16/2013  . PULMONARY FIBROSIS, POSTINFLAMMATORY 09/20/2007  . COLONIC POLYPS, HX OF 10/21/2006    Current Outpatient Medications on File Prior to Visit  Medication Sig Dispense Refill  . diazepam (VALIUM) 5 MG tablet TAKE 1 TABLET EVERY 12 HOURS AS NEEDED FOR ANXIETY 30 tablet 0  . fexofenadine (ALLEGRA) 180 MG tablet Take 180 mg by mouth daily.    . hydroxychloroquine (PLAQUENIL) 200 MG tablet     . omeprazole (PRILOSEC) 20 MG capsule Take 1 capsule (20 mg total) by mouth daily. 30 capsule 3  . ranitidine (ZANTAC) 150 MG tablet Take 1 tablet (150 mg total) by mouth 2 (two) times daily. 60 tablet 5   No current facility-administered medications on file prior to visit.     Past Medical History:  Diagnosis Date  . Anemia    past H/o anemia  . Anxiety    on effexor and valium in the past  . Dyspnea    seeing pulm, hx lupus pneumonitis, mild pulm fibrosis  . Eczema   . Fibromyalgia 01/18/2008   Qualifier: Diagnosis of  By: Yetta Barre MD,  Bernadene Bell.   Marland Kitchen GERD 01/11/2008   Qualifier: Diagnosis of  By: Yetta Barre MD, Bernadene Bell.   . H/O thyroid nodule 01/14/2016  . Hyperglycemia    borderline  . Infertility, female   . Thyroid disease    past h/o hypothyroidism    Past Surgical History:  Procedure Laterality Date  . ABDOMINAL HYSTERECTOMY  2004   for fibroid; retained ovaries  . PELVIC LAPAROSCOPY  1999    Social History   Socioeconomic History  . Marital status: Married    Spouse name: Not on file  . Number of children: Not on file  . Years of education: Not on file  . Highest education level: Not on file  Occupational History  . Not on file  Social Needs  . Financial resource strain: Not on file  . Food insecurity:    Worry: Not on file    Inability: Not on file  . Transportation needs:    Medical: Not on file    Non-medical: Not on file  Tobacco Use  . Smoking status: Never Smoker  . Smokeless tobacco: Never Used  Substance and Sexual Activity  . Alcohol use: Yes    Alcohol/week: 2.0 standard drinks    Types: 2 Standard drinks or equivalent per week    Comment: occ glass of wine  . Drug use: No  . Sexual activity: Yes    Partners: Male  Lifestyle  . Physical activity:  Days per week: Not on file    Minutes per session: Not on file  . Stress: Not on file  Relationships  . Social connections:    Talks on phone: Not on file    Gets together: Not on file    Attends religious service: Not on file    Active member of club or organization: Not on file    Attends meetings of clubs or organizations: Not on file    Relationship status: Not on file  Other Topics Concern  . Not on file  Social History Narrative   Work or School: not working      Home Situation: lives with husband      Spiritual Beliefs: Christian      Lifestyle: eats healthy, not exercising       Family History  Problem Relation Age of Onset  . Diabetes Mother   . Diabetes Father   . Stroke Father   . Anuerysm Father   . Sudden  death Brother   . Heart attack Brother   . Diabetes Paternal Grandmother   . Breast cancer Maternal Grandmother   . Asthma Neg Hx   . Allergic rhinitis Neg Hx   . Immunodeficiency Neg Hx   . Urticaria Neg Hx   . Eczema Neg Hx   . Atopy Neg Hx   . Angioedema Neg Hx     Review of Systems     Objective:  There were no vitals filed for this visit. There were no vitals filed for this visit. There is no height or weight on file to calculate BMI.  Wt Readings from Last 3 Encounters:  08/09/17 169 lb (76.7 kg)  05/27/17 153 lb (69.4 kg)  05/10/17 155 lb (70.3 kg)     Physical Exam       Assessment & Plan:           This encounter was created in error - please disregard.

## 2017-09-13 ENCOUNTER — Encounter: Payer: BLUE CROSS/BLUE SHIELD | Admitting: Internal Medicine

## 2017-09-14 ENCOUNTER — Ambulatory Visit: Payer: BLUE CROSS/BLUE SHIELD | Admitting: Internal Medicine

## 2017-09-19 NOTE — Patient Instructions (Signed)
Test(s) ordered today. Your results will be released to [facility] (or called to you) after review, usually within 72hours after test completion. If any changes need to be made, you will be notified at that same time.  All other Health Maintenance issues reviewed.   All recommended immunizations and age-appropriate screenings are up-to-date or discussed.  No immunizations administered today.   Medications reviewed and updated.  Changes include  /  No changes recommended at this time.  Your prescription(s) have been submitted to your pharmacy. Please take as directed and contact our office if you believe you are having problem(s) with the medication(s).  A referral was ordered for   Please followup in    Health Maintenance, Female Adopting a healthy lifestyle and getting preventive care can go a long way to promote health and wellness. Talk with your health care provider about what schedule of regular examinations is right for you. This is a good chance for you to check in with your provider about disease prevention and staying healthy. In between checkups, there are plenty of things you can do on your own. Experts have done a lot of research about which lifestyle changes and preventive measures are most likely to keep you healthy. Ask your health care provider for more information. Weight and diet Eat a healthy diet  Be sure to include plenty of vegetables, fruits, low-fat dairy products, and lean protein.  Do not eat a lot of foods high in solid fats, added sugars, or salt.  Get regular exercise. This is one of the most important things you can do for your health. ? Most adults should exercise for at least 150 minutes each week. The exercise should increase your heart rate and make you sweat (moderate-intensity exercise). ? Most adults should also do strengthening exercises at least twice a week. This is in addition to the moderate-intensity exercise.  Maintain a healthy weight  Body  mass index (BMI) is a measurement that can be used to identify possible weight problems. It estimates body fat based on height and weight. Your health care provider can help determine your BMI and help you achieve or maintain a healthy weight.  For females 80 years of age and older: ? A BMI below 18.5 is considered underweight. ? A BMI of 18.5 to 24.9 is normal. ? A BMI of 25 to 29.9 is considered overweight. ? A BMI of 30 and above is considered obese.  Watch levels of cholesterol and blood lipids  You should start having your blood tested for lipids and cholesterol at 50 years of age, then have this test every 5 years.  You may need to have your cholesterol levels checked more often if: ? Your lipid or cholesterol levels are high. ? You are older than 50 years of age. ? You are at high risk for heart disease.  Cancer screening Lung Cancer  Lung cancer screening is recommended for adults 61-48 years old who are at high risk for lung cancer because of a history of smoking.  A yearly low-dose CT scan of the lungs is recommended for people who: ? Currently smoke. ? Have quit within the past 15 years. ? Have at least a 30-pack-year history of smoking. A pack year is smoking an average of one pack of cigarettes a day for 1 year.  Yearly screening should continue until it has been 15 years since you quit.  Yearly screening should stop if you develop a health problem that would prevent you from having  lung cancer treatment.  Breast Cancer  Practice breast self-awareness. This means understanding how your breasts normally appear and feel.  It also means doing regular breast self-exams. Let your health care provider know about any changes, no matter how small.  If you are in your 20s or 30s, you should have a clinical breast exam (CBE) by a health care provider every 1-3 years as part of a regular health exam.  If you are 40 or older, have a CBE every year. Also consider having a  breast X-ray (mammogram) every year.  If you have a family history of breast cancer, talk to your health care provider about genetic screening.  If you are at high risk for breast cancer, talk to your health care provider about having an MRI and a mammogram every year.  Breast cancer gene (BRCA) assessment is recommended for women who have family members with BRCA-related cancers. BRCA-related cancers include: ? Breast. ? Ovarian. ? Tubal. ? Peritoneal cancers.  Results of the assessment will determine the need for genetic counseling and BRCA1 and BRCA2 testing.  Cervical Cancer Your health care provider may recommend that you be screened regularly for cancer of the pelvic organs (ovaries, uterus, and vagina). This screening involves a pelvic examination, including checking for microscopic changes to the surface of your cervix (Pap test). You may be encouraged to have this screening done every 3 years, beginning at age 21.  For women ages 30-65, health care providers may recommend pelvic exams and Pap testing every 3 years, or they may recommend the Pap and pelvic exam, combined with testing for human papilloma virus (HPV), every 5 years. Some types of HPV increase your risk of cervical cancer. Testing for HPV may also be done on women of any age with unclear Pap test results.  Other health care providers may not recommend any screening for nonpregnant women who are considered low risk for pelvic cancer and who do not have symptoms. Ask your health care provider if a screening pelvic exam is right for you.  If you have had past treatment for cervical cancer or a condition that could lead to cancer, you need Pap tests and screening for cancer for at least 20 years after your treatment. If Pap tests have been discontinued, your risk factors (such as having a new sexual partner) need to be reassessed to determine if screening should resume. Some women have medical problems that increase the chance  of getting cervical cancer. In these cases, your health care provider may recommend more frequent screening and Pap tests.  Colorectal Cancer  This type of cancer can be detected and often prevented.  Routine colorectal cancer screening usually begins at 50 years of age and continues through 50 years of age.  Your health care provider may recommend screening at an earlier age if you have risk factors for colon cancer.  Your health care provider may also recommend using home test kits to check for hidden blood in the stool.  A small camera at the end of a tube can be used to examine your colon directly (sigmoidoscopy or colonoscopy). This is done to check for the earliest forms of colorectal cancer.  Routine screening usually begins at age 50.  Direct examination of the colon should be repeated every 5-10 years through 50 years of age. However, you may need to be screened more often if early forms of precancerous polyps or small growths are found.  Skin Cancer  Check your skin from head to   toe regularly.  Tell your health care provider about any new moles or changes in moles, especially if there is a change in a mole's shape or color.  Also tell your health care provider if you have a mole that is larger than the size of a pencil eraser.  Always use sunscreen. Apply sunscreen liberally and repeatedly throughout the day.  Protect yourself by wearing long sleeves, pants, a wide-brimmed hat, and sunglasses whenever you are outside.  Heart disease, diabetes, and high blood pressure  High blood pressure causes heart disease and increases the risk of stroke. High blood pressure is more likely to develop in: ? People who have blood pressure in the high end of the normal range (130-139/85-89 mm Hg). ? People who are overweight or obese. ? People who are African American.  If you are 12-70 years of age, have your blood pressure checked every 3-5 years. If you are 37 years of age or older,  have your blood pressure checked every year. You should have your blood pressure measured twice-once when you are at a hospital or clinic, and once when you are not at a hospital or clinic. Record the average of the two measurements. To check your blood pressure when you are not at a hospital or clinic, you can use: ? An automated blood pressure machine at a pharmacy. ? A home blood pressure monitor.  If you are between 56 years and 70 years old, ask your health care provider if you should take aspirin to prevent strokes.  Have regular diabetes screenings. This involves taking a blood sample to check your fasting blood sugar level. ? If you are at a normal weight and have a low risk for diabetes, have this test once every three years after 50 years of age. ? If you are overweight and have a high risk for diabetes, consider being tested at a younger age or more often. Preventing infection Hepatitis B  If you have a higher risk for hepatitis B, you should be screened for this virus. You are considered at high risk for hepatitis B if: ? You were born in a country where hepatitis B is common. Ask your health care provider which countries are considered high risk. ? Your parents were born in a high-risk country, and you have not been immunized against hepatitis B (hepatitis B vaccine). ? You have HIV or AIDS. ? You use needles to inject street drugs. ? You live with someone who has hepatitis B. ? You have had sex with someone who has hepatitis B. ? You get hemodialysis treatment. ? You take certain medicines for conditions, including cancer, organ transplantation, and autoimmune conditions.  Hepatitis C  Blood testing is recommended for: ? Everyone born from [birth year] through 1963. ? Anyone with known risk factors for hepatitis C.  Sexually transmitted infections (STIs)  You should be screened for sexually transmitted infections (STIs) including gonorrhea and chlamydia if: ? You are sexually  active and are younger than 50 years of age. ? You are older than 50 years of age and your health care provider tells you that you are at risk for this type of infection. ? Your sexual activity has changed since you were last screened and you are at an increased risk for chlamydia or gonorrhea. Ask your health care provider if you are at risk.  If you do not have HIV, but are at risk, it may be recommended that you take a prescription medicine daily to prevent HIV infection.  This is called pre-exposure prophylaxis (PrEP). You are considered at risk if: ? You are sexually active and do not regularly use condoms or know the HIV status of your partner(s). ? You take drugs by injection. ? You are sexually active with a partner who has HIV.  Talk with your health care provider about whether you are at high risk of being infected with HIV. If you choose to begin PrEP, you should first be tested for HIV. You should then be tested every 3 months for as long as you are taking PrEP. Pregnancy  If you are premenopausal and you may become pregnant, ask your health care provider about preconception counseling.  If you may become pregnant, take 400 to 800 micrograms (mcg) of folic acid every day.  If you want to prevent pregnancy, talk to your health care provider about birth control (contraception). Osteoporosis and menopause  Osteoporosis is a disease in which the bones lose minerals and strength with aging. This can result in serious bone fractures. Your risk for osteoporosis can be identified using a bone density scan.  If you are 73 years of age or older, or if you are at risk for osteoporosis and fractures, ask your health care provider if you should be screened.  Ask your health care provider whether you should take a calcium or vitamin D supplement to lower your risk for osteoporosis.  Menopause may have certain physical symptoms and risks.  Hormone replacement therapy may reduce some of these  symptoms and risks. Talk to your health care provider about whether hormone replacement therapy is right for you. Follow these instructions at home:  Schedule regular health, dental, and eye exams.  Stay current with your immunizations.  Do not use any tobacco products including cigarettes, chewing tobacco, or electronic cigarettes.  If you are pregnant, do not drink alcohol.  If you are breastfeeding, limit how much and how often you drink alcohol.  Limit alcohol intake to no more than 1 drink per day for nonpregnant women. One drink equals 12 ounces of beer, 5 ounces of wine, or 1 ounces of hard liquor.  Do not use street drugs.  Do not share needles.  Ask your health care provider for help if you need support or information about quitting drugs.  Tell your health care provider if you often feel depressed.  Tell your health care provider if you have ever been abused or do not feel safe at home. This information is not intended to replace advice given to you by your health care provider. Make sure you discuss any questions you have with your health care provider. Document Released: 07/07/2010 Document Revised: 05/30/2015 Document Reviewed: 09/25/2014 Elsevier Interactive Patient Education  [employer].

## 2017-09-19 NOTE — Progress Notes (Signed)
  Subjective:    Patient ID: Donna Blankenship, female    DOB: 06/16/1965, 52 y.o.   MRN: [id number]  HPI     Medications and allergies reviewed with patient and updated if appropriate.  Patient Active Problem List   Diagnosis Date Noted  . Dysuria 05/10/2017  . Tingling in extremities 05/10/2017  . Anaphylactic shock due to adverse food reaction 12/24/2015  . Other allergic rhinitis 12/24/2015  . Lactose intolerance 12/24/2015  . Vitamin D deficiency 12/11/2015  . Gastroesophageal reflux disease 12/11/2015  . Axillary lump 08/29/2015  . Paresthesia of both hands 05/25/2015  . Hashimoto's thyroiditis 02/27/2015  . Elevated lipoprotein(a) 02/27/2015  . Insomnia 02/27/2015  . Thyroid nodule 12/19/2014  . Eczema 12/19/2014  . Food allergy 12/19/2014  . Hypercalcemia 12/19/2014  . Lichen planus 11/13/2014  . GAD (generalized anxiety disorder) 08/13/2014  . Dyspnea 04/20/2014  . Hyperglycemia 01/01/2014  . Pure hypercholesterolemia 06/16/2013  . PULMONARY FIBROSIS, POSTINFLAMMATORY 09/20/2007  . COLONIC POLYPS, HX OF 10/21/2006    Current Outpatient Medications on File Prior to Visit  Medication Sig Dispense Refill  . diazepam (VALIUM) 5 MG tablet TAKE 1 TABLET EVERY 12 HOURS AS NEEDED FOR ANXIETY 30 tablet 0  . fexofenadine (ALLEGRA) 180 MG tablet Take 180 mg by mouth daily.    . hydroxychloroquine (PLAQUENIL) 200 MG tablet     . omeprazole (PRILOSEC) 20 MG capsule Take 1 capsule (20 mg total) by mouth daily. 30 capsule 3  . ranitidine (ZANTAC) 150 MG tablet Take 1 tablet (150 mg total) by mouth 2 (two) times daily. 60 tablet 5   No current facility-administered medications on file prior to visit.     Past Medical History:  Diagnosis Date  . Anemia    past H/o anemia  . Anxiety    on effexor and valium in the past  . Dyspnea    seeing pulm, hx lupus pneumonitis, mild pulm fibrosis  . Eczema   . Fibromyalgia 01/18/2008   Qualifier: Diagnosis of  By: Jones MD,  Thomas L.   . GERD 01/11/2008   Qualifier: Diagnosis of  By: Jones MD, Thomas L.   . H/O thyroid nodule 01/14/2016  . Hyperglycemia    borderline  . Infertility, female   . Thyroid disease    past h/o hypothyroidism    Past Surgical History:  Procedure Laterality Date  . ABDOMINAL HYSTERECTOMY  2004   for fibroid; retained ovaries  . PELVIC LAPAROSCOPY  1999    Social History   Socioeconomic History  . Marital status: Married    Spouse name: Not on file  . Number of children: Not on file  . Years of education: Not on file  . Highest education level: Not on file  Occupational History  . Not on file  Social Needs  . Financial resource strain: Not on file  . Food insecurity:    Worry: Not on file    Inability: Not on file  . Transportation needs:    Medical: Not on file    Non-medical: Not on file  Tobacco Use  . Smoking status: Never Smoker  . Smokeless tobacco: Never Used  Substance and Sexual Activity  . Alcohol use: Yes    Alcohol/week: 2.0 standard drinks    Types: 2 Standard drinks or equivalent per week    Comment: occ glass of wine  . Drug use: No  . Sexual activity: Yes    Partners: Male  Lifestyle  . Physical activity:      Days per week: Not on file    Minutes per session: Not on file  . Stress: Not on file  Relationships  . Social connections:    Talks on phone: Not on file    Gets together: Not on file    Attends religious service: Not on file    Active member of club or organization: Not on file    Attends meetings of clubs or organizations: Not on file    Relationship status: Not on file  Other Topics Concern  . Not on file  Social History Narrative   Work or School: not working      Home Situation: lives with husband      Spiritual Beliefs: Christian      Lifestyle: eats healthy, not exercising       Family History  Problem Relation Age of Onset  . Diabetes Mother   . Diabetes Father   . Stroke Father   . Anuerysm Father   . Sudden  death Brother   . Heart attack Brother   . Diabetes Paternal Grandmother   . Breast cancer Maternal Grandmother   . Asthma Neg Hx   . Allergic rhinitis Neg Hx   . Immunodeficiency Neg Hx   . Urticaria Neg Hx   . Eczema Neg Hx   . Atopy Neg Hx   . Angioedema Neg Hx     Review of Systems     Objective:  There were no vitals filed for this visit. There were no vitals filed for this visit. There is no height or weight on file to calculate BMI.  Wt Readings from Last 3 Encounters:  08/09/17 169 lb (76.7 kg)  05/27/17 153 lb (69.4 kg)  05/10/17 155 lb (70.3 kg)     Physical Exam       Assessment & Plan:           This encounter was created in error - please disregard. 

## 2017-09-20 ENCOUNTER — Encounter: Payer: BLUE CROSS/BLUE SHIELD | Admitting: Internal Medicine

## 2017-09-20 DIAGNOSIS — Z0289 Encounter for other administrative examinations: Secondary | ICD-10-CM

## 2017-09-23 ENCOUNTER — Other Ambulatory Visit: Payer: Self-pay | Admitting: Internal Medicine

## 2017-09-24 ENCOUNTER — Encounter: Payer: Self-pay | Admitting: Emergency Medicine

## 2017-09-24 NOTE — Telephone Encounter (Signed)
[place] Controlled Substance Database checked. Last filled on 08/19/17  Pt No Showed twice recently.

## 2017-10-19 NOTE — Progress Notes (Signed)
Subjective:    Patient ID: Donna Blankenship, female    DOB: 11-Sep-1965, 52 y.o.   MRN: [id number]  HPI She is here for a physical exam.   She quit her job and has less stress.    She is still having GERD/increased acid.  She is having a colonoscopy and EGD with Dr Loreta Ave at some point soon.  She does have an acid feeling when she urinates and her neck sometimes itches and she wonders if that is related to increased acid-she thinks her whole system has increased acid.  She wonders about testing her blood to see if it is ascitic.  She is taking the ranitidine.  She is eating a very low acid diet.  She does not understand why her symptoms are not improving and what else she needs to do.  She has gained a lot of weight in the past year trying to eat a low acid diet.  I did prescribe omeprazole 20 mg daily and she did not want to take a PPI and has not tried it.  She is doing a lot of natural supplements to help.   Medications and allergies reviewed with patient and updated if appropriate.  Patient Active Problem List   Diagnosis Date Noted  . Dysuria 05/10/2017  . Tingling in extremities 05/10/2017  . Anaphylactic shock due to adverse food reaction 12/24/2015  . Other allergic rhinitis 12/24/2015  . Lactose intolerance 12/24/2015  . Vitamin D deficiency 12/11/2015  . Gastroesophageal reflux disease 12/11/2015  . Axillary lump 08/29/2015  . Paresthesia of both hands 05/25/2015  . Hashimoto's thyroiditis 02/27/2015  . Elevated lipoprotein(a) 02/27/2015  . Insomnia 02/27/2015  . Thyroid nodule 12/19/2014  . Eczema 12/19/2014  . Food allergy 12/19/2014  . Hypercalcemia 12/19/2014  . Lichen planus 11/13/2014  . GAD (generalized anxiety disorder) 08/13/2014  . Dyspnea 04/20/2014  . Hyperglycemia 01/01/2014  . Pure hypercholesterolemia 06/16/2013  . PULMONARY FIBROSIS, POSTINFLAMMATORY 09/20/2007  . COLONIC POLYPS, HX OF 10/21/2006    Current Outpatient Medications on File Prior to  Visit  Medication Sig Dispense Refill  . diazepam (VALIUM) 5 MG tablet TAKE 1 TABLET EVERY 12 HOURS AS NEEDED FOR ANXIETY 30 tablet 0  . fexofenadine (ALLEGRA) 180 MG tablet Take 180 mg by mouth daily.    . hydroxychloroquine (PLAQUENIL) 200 MG tablet     . ranitidine (ZANTAC) 150 MG tablet Take 1 tablet (150 mg total) by mouth 2 (two) times daily. 60 tablet 5   No current facility-administered medications on file prior to visit.     Past Medical History:  Diagnosis Date  . Anemia    past H/o anemia  . Anxiety    on effexor and valium in the past  . Dyspnea    seeing pulm, hx lupus pneumonitis, mild pulm fibrosis  . Eczema   . Fibromyalgia 01/18/2008   Qualifier: Diagnosis of  By: Yetta Barre MD, Bernadene Bell.   Marland Kitchen GERD 01/11/2008   Qualifier: Diagnosis of  By: Yetta Barre MD, Bernadene Bell.   . H/O thyroid nodule 01/14/2016  . Hyperglycemia    borderline  . Infertility, female   . Thyroid disease    past h/o hypothyroidism    Past Surgical History:  Procedure Laterality Date  . ABDOMINAL HYSTERECTOMY  2004   for fibroid; retained ovaries  . PELVIC LAPAROSCOPY  1999    Social History   Socioeconomic History  . Marital status: Married    Spouse name: Not on file  .  Number of children: Not on file  . Years of education: Not on file  . Highest education level: Not on file  Occupational History  . Not on file  Social Needs  . Financial resource strain: Not on file  . Food insecurity:    Worry: Not on file    Inability: Not on file  . Transportation needs:    Medical: Not on file    Non-medical: Not on file  Tobacco Use  . Smoking status: Never Smoker  . Smokeless tobacco: Never Used  Substance and Sexual Activity  . Alcohol use: Yes    Alcohol/week: 2.0 standard drinks    Types: 2 Standard drinks or equivalent per week    Comment: occ glass of wine  . Drug use: No  . Sexual activity: Yes    Partners: Male  Lifestyle  . Physical activity:    Days per week: Not on file    Minutes  per session: Not on file  . Stress: Not on file  Relationships  . Social connections:    Talks on phone: Not on file    Gets together: Not on file    Attends religious service: Not on file    Active member of club or organization: Not on file    Attends meetings of clubs or organizations: Not on file    Relationship status: Not on file  Other Topics Concern  . Not on file  Social History Narrative   Work or School: not working      Home Situation: lives with husband      Spiritual Beliefs: Christian      Lifestyle: eats healthy, not exercising       Family History  Problem Relation Age of Onset  . Diabetes Mother   . Diabetes Father   . Stroke Father   . Anuerysm Father   . Sudden death Brother   . Heart attack Brother   . Diabetes Paternal Grandmother   . Breast cancer Maternal Grandmother   . Asthma Neg Hx   . Allergic rhinitis Neg Hx   . Immunodeficiency Neg Hx   . Urticaria Neg Hx   . Eczema Neg Hx   . Atopy Neg Hx   . Angioedema Neg Hx     Review of Systems  Constitutional: Negative for chills, fatigue and fever.  Eyes: Negative for visual disturbance.  Respiratory: Negative for cough, shortness of breath and wheezing.   Cardiovascular: Negative for chest pain, palpitations and leg swelling.  Gastrointestinal: Negative for abdominal pain, blood in stool, constipation, diarrhea and nausea.       Reflux, burping  Genitourinary: Negative for dysuria (acid feeling) and frequency.  Musculoskeletal: Negative for arthralgias and joint swelling.  Skin: Negative for color change.  Neurological: Negative for light-headedness and headaches.  Psychiatric/Behavioral: Positive for sleep disturbance. Negative for dysphoric mood. The patient is nervous/anxious.        Objective:   Vitals:   10/20/17 1334  BP: 118/72  Pulse: 73  Resp: 16  Temp: 98.4 F (36.9 C)  SpO2: 96%   Filed Weights   10/20/17 1334  Weight: 177 lb 12.8 oz (80.6 kg)   Body mass index is  28.7 kg/m.  Wt Readings from Last 3 Encounters:  10/20/17 177 lb 12.8 oz (80.6 kg)  08/09/17 169 lb (76.7 kg)  05/27/17 153 lb (69.4 kg)   09/10/16 -- 147 lbs   Physical Exam Constitutional: She appears well-developed and well-nourished. No distress.  HENT:  Head: Normocephalic and atraumatic.  Right Ear: External ear normal. Normal ear canal and TM Left Ear: External ear normal.  Normal ear canal and TM Mouth/Throat: Oropharynx is clear and moist.  Eyes: Conjunctivae and EOM are normal.  Neck: Neck supple. No tracheal deviation present. No thyromegaly present.  No carotid bruit  Cardiovascular: Normal rate, regular rhythm and normal heart sounds.   No murmur heard.  No edema. Pulmonary/Chest: Effort normal and breath sounds normal. No respiratory distress. She has no wheezes. She has no rales.  Breast: deferred to Gyn Abdominal: Soft. She exhibits no distension. There is no tenderness.  Lymphadenopathy: She has no cervical adenopathy.  Skin: Skin is warm and dry. She is not diaphoretic.  Psychiatric: She has a normal mood and affect. Her behavior is normal.        Assessment & Plan:   Physical exam: Screening blood work  ordered Immunizations   deferred all vaccines Colonoscopy  Will have done soon Mammogram  Will schedule Gyn  Will schedule Eye exams  Due - will schedule  - Dr Dione Booze EKG   Done  08/2015 Exercise - has joined a gym Weight  - working weight loss Skin  - will see derm  soone - has tingling on scalp Substance abuse   none  See Problem List for Assessment and Plan of chronic medical problems.  Follow-up in 1 year, sooner if needed

## 2017-10-20 ENCOUNTER — Encounter: Payer: Self-pay | Admitting: Internal Medicine

## 2017-10-20 ENCOUNTER — Ambulatory Visit (INDEPENDENT_AMBULATORY_CARE_PROVIDER_SITE_OTHER): Payer: BLUE CROSS/BLUE SHIELD | Admitting: Internal Medicine

## 2017-10-20 VITALS — BP 118/72 | HR 73 | Temp 98.4°F | Resp 16 | Ht 66.0 in | Wt 177.8 lb

## 2017-10-20 DIAGNOSIS — K219 Gastro-esophageal reflux disease without esophagitis: Secondary | ICD-10-CM | POA: Diagnosis not present

## 2017-10-20 DIAGNOSIS — F411 Generalized anxiety disorder: Secondary | ICD-10-CM

## 2017-10-20 DIAGNOSIS — E7800 Pure hypercholesterolemia, unspecified: Secondary | ICD-10-CM

## 2017-10-20 DIAGNOSIS — Z0000 Encounter for general adult medical examination without abnormal findings: Secondary | ICD-10-CM | POA: Diagnosis not present

## 2017-10-20 DIAGNOSIS — R739 Hyperglycemia, unspecified: Secondary | ICD-10-CM | POA: Diagnosis not present

## 2017-10-20 NOTE — Assessment & Plan Note (Signed)
Takes Valium as needed

## 2017-10-20 NOTE — Assessment & Plan Note (Signed)
Will check A1c We will start regular exercise and work on weight loss

## 2017-10-20 NOTE — Patient Instructions (Addendum)
Call and schedule your mammogram -  [facility] 7 a.m.-6:30 p.m., Monday 7 a.m.-5 p.m., Tuesday-Friday Schedule an appointment by calling [phone]     Tests ordered today. Your results will be released to [facility] (or called to you) after review, usually within 72hours after test completion. If any changes need to be made, you will be notified at that same time.   Medications reviewed and updated.  Changes include :   None, start the PPI    Please followup in one year    Health Maintenance, Female Adopting a healthy lifestyle and getting preventive care can go a long way to promote health and wellness. Talk with your health care provider about what schedule of regular examinations is right for you. This is a good chance for you to check in with your provider about disease prevention and staying healthy. In between checkups, there are plenty of things you can do on your own. Experts have done a lot of research about which lifestyle changes and preventive measures are most likely to keep you healthy. Ask your health care provider for more information. Weight and diet Eat a healthy diet  Be sure to include plenty of vegetables, fruits, low-fat dairy products, and lean protein.  Do not eat a lot of foods high in solid fats, added sugars, or salt.  Get regular exercise. This is one of the most important things you can do for your health. ? Most adults should exercise for at least 150 minutes each week. The exercise should increase your heart rate and make you sweat (moderate-intensity exercise). ? Most adults should also do strengthening exercises at least twice a week. This is in addition to the moderate-intensity exercise.  Maintain a healthy weight  Body mass index (BMI) is a measurement that can be used to identify possible weight problems. It estimates body fat based on height and weight. Your health care provider can help determine your BMI and  help you achieve or maintain a healthy weight.  For females 71 years of age and older: ? A BMI below 18.5 is considered underweight. ? A BMI of 18.5 to 24.9 is normal. ? A BMI of 25 to 29.9 is considered overweight. ? A BMI of 30 and above is considered obese.  Watch levels of cholesterol and blood lipids  You should start having your blood tested for lipids and cholesterol at 50 years of age, then have this test every 5 years.  You may need to have your cholesterol levels checked more often if: ? Your lipid or cholesterol levels are high. ? You are older than 50 years of age. ? You are at high risk for heart disease.  Cancer screening Lung Cancer  Lung cancer screening is recommended for adults 62-40 years old who are at high risk for lung cancer because of a history of smoking.  A yearly low-dose CT scan of the lungs is recommended for people who: ? Currently smoke. ? Have quit within the past 15 years. ? Have at least a 30-pack-year history of smoking. A pack year is smoking an average of one pack of cigarettes a day for 1 year.  Yearly screening should continue until it has been 15 years since you quit.  Yearly screening should stop if you develop a health problem that would prevent you from having lung cancer treatment.  Breast Cancer  Practice breast self-awareness. This means understanding how your breasts normally appear and feel.  It also means  doing regular breast self-exams. Let your health care provider know about any changes, no matter how small.  If you are in your 20s or 30s, you should have a clinical breast exam (CBE) by a health care provider every 1-3 years as part of a regular health exam.  If you are 65 or older, have a CBE every year. Also consider having a breast X-ray (mammogram) every year.  If you have a family history of breast cancer, talk to your health care provider about genetic screening.  If you are at high risk for breast cancer, talk to  your health care provider about having an MRI and a mammogram every year.  Breast cancer gene (BRCA) assessment is recommended for women who have family members with BRCA-related cancers. BRCA-related cancers include: ? Breast. ? Ovarian. ? Tubal. ? Peritoneal cancers.  Results of the assessment will determine the need for genetic counseling and BRCA1 and BRCA2 testing.  Cervical Cancer Your health care provider may recommend that you be screened regularly for cancer of the pelvic organs (ovaries, uterus, and vagina). This screening involves a pelvic examination, including checking for microscopic changes to the surface of your cervix (Pap test). You may be encouraged to have this screening done every 3 years, beginning at age 49.  For women ages 64-65, health care providers may recommend pelvic exams and Pap testing every 3 years, or they may recommend the Pap and pelvic exam, combined with testing for human papilloma virus (HPV), every 5 years. Some types of HPV increase your risk of cervical cancer. Testing for HPV may also be done on women of any age with unclear Pap test results.  Other health care providers may not recommend any screening for nonpregnant women who are considered low risk for pelvic cancer and who do not have symptoms. Ask your health care provider if a screening pelvic exam is right for you.  If you have had past treatment for cervical cancer or a condition that could lead to cancer, you need Pap tests and screening for cancer for at least 20 years after your treatment. If Pap tests have been discontinued, your risk factors (such as having a new sexual partner) need to be reassessed to determine if screening should resume. Some women have medical problems that increase the chance of getting cervical cancer. In these cases, your health care provider may recommend more frequent screening and Pap tests.  Colorectal Cancer  This type of cancer can be detected and often  prevented.  Routine colorectal cancer screening usually begins at 50 years of age and continues through 50 years of age.  Your health care provider may recommend screening at an earlier age if you have risk factors for colon cancer.  Your health care provider may also recommend using home test kits to check for hidden blood in the stool.  A small camera at the end of a tube can be used to examine your colon directly (sigmoidoscopy or colonoscopy). This is done to check for the earliest forms of colorectal cancer.  Routine screening usually begins at age 65.  Direct examination of the colon should be repeated every 5-10 years through 50 years of age. However, you may need to be screened more often if early forms of precancerous polyps or small growths are found.  Skin Cancer  Check your skin from head to toe regularly.  Tell your health care provider about any new moles or changes in moles, especially if there is a change in a  mole's shape or color.  Also tell your health care provider if you have a mole that is larger than the size of a pencil eraser.  Always use sunscreen. Apply sunscreen liberally and repeatedly throughout the day.  Protect yourself by wearing long sleeves, pants, a wide-brimmed hat, and sunglasses whenever you are outside.  Heart disease, diabetes, and high blood pressure  High blood pressure causes heart disease and increases the risk of stroke. High blood pressure is more likely to develop in: ? People who have blood pressure in the high end of the normal range (130-139/85-89 mm Hg). ? People who are overweight or obese. ? People who are African American.  If you are 37-1 years of age, have your blood pressure checked every 3-5 years. If you are 64 years of age or older, have your blood pressure checked every year. You should have your blood pressure measured twice-once when you are at a hospital or clinic, and once when you are not at a hospital or clinic.  Record the average of the two measurements. To check your blood pressure when you are not at a hospital or clinic, you can use: ? An automated blood pressure machine at a pharmacy. ? A home blood pressure monitor.  If you are between 36 years and 62 years old, ask your health care provider if you should take aspirin to prevent strokes.  Have regular diabetes screenings. This involves taking a blood sample to check your fasting blood sugar level. ? If you are at a normal weight and have a low risk for diabetes, have this test once every three years after 50 years of age. ? If you are overweight and have a high risk for diabetes, consider being tested at a younger age or more often. Preventing infection Hepatitis B  If you have a higher risk for hepatitis B, you should be screened for this virus. You are considered at high risk for hepatitis B if: ? You were born in a country where hepatitis B is common. Ask your health care provider which countries are considered high risk. ? Your parents were born in a high-risk country, and you have not been immunized against hepatitis B (hepatitis B vaccine). ? You have HIV or AIDS. ? You use needles to inject street drugs. ? You live with someone who has hepatitis B. ? You have had sex with someone who has hepatitis B. ? You get hemodialysis treatment. ? You take certain medicines for conditions, including cancer, organ transplantation, and autoimmune conditions.  Hepatitis C  Blood testing is recommended for: ? Everyone born from [birth year] through 1963. ? Anyone with known risk factors for hepatitis C.  Sexually transmitted infections (STIs)  You should be screened for sexually transmitted infections (STIs) including gonorrhea and chlamydia if: ? You are sexually active and are younger than 50 years of age. ? You are older than 50 years of age and your health care provider tells you that you are at risk for this type of infection. ? Your sexual  activity has changed since you were last screened and you are at an increased risk for chlamydia or gonorrhea. Ask your health care provider if you are at risk.  If you do not have HIV, but are at risk, it may be recommended that you take a prescription medicine daily to prevent HIV infection. This is called pre-exposure prophylaxis (PrEP). You are considered at risk if: ? You are sexually active and do not regularly use condoms or  know the HIV status of your partner(s). ? You take drugs by injection. ? You are sexually active with a partner who has HIV.  Talk with your health care provider about whether you are at high risk of being infected with HIV. If you choose to begin PrEP, you should first be tested for HIV. You should then be tested every 3 months for as long as you are taking PrEP. Pregnancy  If you are premenopausal and you may become pregnant, ask your health care provider about preconception counseling.  If you may become pregnant, take 400 to 800 micrograms (mcg) of folic acid every day.  If you want to prevent pregnancy, talk to your health care provider about birth control (contraception). Osteoporosis and menopause  Osteoporosis is a disease in which the bones lose minerals and strength with aging. This can result in serious bone fractures. Your risk for osteoporosis can be identified using a bone density scan.  If you are 27 years of age or older, or if you are at risk for osteoporosis and fractures, ask your health care provider if you should be screened.  Ask your health care provider whether you should take a calcium or vitamin D supplement to lower your risk for osteoporosis.  Menopause may have certain physical symptoms and risks.  Hormone replacement therapy may reduce some of these symptoms and risks. Talk to your health care provider about whether hormone replacement therapy is right for you. Follow these instructions at home:  Schedule regular health, dental,  and eye exams.  Stay current with your immunizations.  Do not use any tobacco products including cigarettes, chewing tobacco, or electronic cigarettes.  If you are pregnant, do not drink alcohol.  If you are breastfeeding, limit how much and how often you drink alcohol.  Limit alcohol intake to no more than 1 drink per day for nonpregnant women. One drink equals 12 ounces of beer, 5 ounces of wine, or 1 ounces of hard liquor.  Do not use street drugs.  Do not share needles.  Ask your health care provider for help if you need support or information about quitting drugs.  Tell your health care provider if you often feel depressed.  Tell your health care provider if you have ever been abused or do not feel safe at home. This information is not intended to replace advice given to you by your health care provider. Make sure you discuss any questions you have with your health care provider. Document Released: 07/07/2010 Document Revised: 05/30/2015 Document Reviewed: 09/25/2014 Elsevier Interactive Patient Education  [employer].

## 2017-10-20 NOTE — Assessment & Plan Note (Signed)
Check lipid panel will work on weight loss Regular exercise and healthy diet encouraged

## 2017-10-20 NOTE — Assessment & Plan Note (Signed)
Having GERD symptoms Taking ranitidine and natural supplements Weight gain has possibly increase some of her GERD symptoms Her natural supplements may also be causing some of her GERD Stressed the importance of taking the PPI-advised this will only be temporary until her heartburn is controlled and then she can go off of it Has seen Dr. Loreta Ave and will have an EGD

## 2017-10-22 ENCOUNTER — Telehealth: Payer: Self-pay | Admitting: Internal Medicine

## 2017-10-22 DIAGNOSIS — K219 Gastro-esophageal reflux disease without esophagitis: Secondary | ICD-10-CM

## 2017-10-22 NOTE — Telephone Encounter (Signed)
ordered

## 2017-10-22 NOTE — Telephone Encounter (Signed)
Copied from CRM [id number]. Topic: Quick Communication - See Telephone Encounter >> Oct 22, 2017  8:11 AM Burchel, Abbi R wrote: CRM for notification. See Telephone encounter for: 10/22/17.  Pt requesting referral to [facility] for acid reflux/digestive issues  [phone] [facility])

## 2017-10-25 ENCOUNTER — Other Ambulatory Visit (INDEPENDENT_AMBULATORY_CARE_PROVIDER_SITE_OTHER): Payer: BLUE CROSS/BLUE SHIELD

## 2017-10-25 ENCOUNTER — Telehealth: Payer: Self-pay | Admitting: *Deleted

## 2017-10-25 ENCOUNTER — Telehealth: Payer: Self-pay | Admitting: Internal Medicine

## 2017-10-25 DIAGNOSIS — E7800 Pure hypercholesterolemia, unspecified: Secondary | ICD-10-CM | POA: Diagnosis not present

## 2017-10-25 DIAGNOSIS — R739 Hyperglycemia, unspecified: Secondary | ICD-10-CM

## 2017-10-25 DIAGNOSIS — Z0000 Encounter for general adult medical examination without abnormal findings: Secondary | ICD-10-CM | POA: Diagnosis not present

## 2017-10-25 DIAGNOSIS — R300 Dysuria: Secondary | ICD-10-CM

## 2017-10-25 LAB — COMPREHENSIVE METABOLIC PANEL
ALBUMIN: 4.9 g/dL (ref 3.5–5.2)
ALT: 12 U/L (ref 0–35)
AST: 18 U/L (ref 0–37)
Alkaline Phosphatase: 61 U/L (ref 39–117)
BUN: 10 mg/dL (ref 6–23)
CALCIUM: 10.2 mg/dL (ref 8.4–10.5)
CHLORIDE: 101 meq/L (ref 96–112)
CO2: 30 meq/L (ref 19–32)
CREATININE: 1 mg/dL (ref 0.40–1.20)
GFR: 75.36 mL/min (ref >60.00)
GLUCOSE: 102 mg/dL — AB (ref 70–99)
Potassium: 4.4 mEq/L (ref 3.5–5.1)
SODIUM: 136 meq/L (ref 135–145)
TOTAL PROTEIN: 7.9 g/dL (ref 6.0–8.3)
Total Bilirubin: 0.8 mg/dL (ref 0.2–1.2)

## 2017-10-25 LAB — LIPID PANEL
CHOLESTEROL: 218 mg/dL — AB (ref 0–200)
HDL: 77.4 mg/dL (ref >39.00)
LDL Cholesterol: 117 mg/dL — ABNORMAL HIGH (ref 0–99)
NonHDL: 140.23
TRIGLYCERIDES: 114 mg/dL (ref 0.0–149.0)
Total CHOL/HDL Ratio: 3
VLDL: 22.8 mg/dL (ref 0.0–40.0)

## 2017-10-25 LAB — CBC WITH DIFFERENTIAL/PLATELET
BASOS PCT: 0.9 % (ref 0.0–3.0)
Basophils Absolute: 0.1 10*3/uL (ref 0.0–0.1)
EOS ABS: 0.2 10*3/uL (ref 0.0–0.7)
Eosinophils Relative: 3.6 % (ref 0.0–5.0)
HCT: 40 % (ref 36.0–46.0)
Hemoglobin: 13.7 g/dL (ref 12.0–15.0)
Lymphocytes Relative: 21.2 % (ref 12.0–46.0)
Lymphs Abs: 1.2 10*3/uL (ref 0.7–4.0)
MCHC: 34.4 g/dL (ref 30.0–36.0)
MCV: 86.1 fl (ref 78.0–100.0)
MONO ABS: 0.6 10*3/uL (ref 0.1–1.0)
Monocytes Relative: 11.2 % (ref 3.0–12.0)
NEUTROS ABS: 3.6 10*3/uL (ref 1.4–7.7)
Neutrophils Relative %: 63.1 % (ref 43.0–77.0)
PLATELETS: 169 10*3/uL (ref 150.0–400.0)
RBC: 4.64 Mil/uL (ref 3.87–5.11)
RDW: 13.7 % (ref 11.5–15.5)
WBC: 5.7 10*3/uL (ref 4.0–10.5)

## 2017-10-25 LAB — URINALYSIS, ROUTINE W REFLEX MICROSCOPIC: RBC / HPF: NONE SEEN (ref 0–?)

## 2017-10-25 LAB — HEMOGLOBIN A1C: Hgb A1c MFr Bld: 5.8 % (ref 4.6–6.5)

## 2017-10-25 NOTE — Telephone Encounter (Signed)
Copied from CRM [id number]. Topic: Referral - Request for Referral >> Oct 25, 2017  1:30 PM Leafy Ro wrote: Has patient seen PCP for this complaint? Yes. Pt is calling and would like a referral to urologist for burning when urinating. Pt saw dr burns on 10-20-17. Pt has bcbs.

## 2017-10-25 NOTE — Telephone Encounter (Signed)
Urine tests ordered.

## 2017-10-25 NOTE — Telephone Encounter (Signed)
Pt aware.

## 2017-10-25 NOTE — Telephone Encounter (Signed)
Referral ordered

## 2017-10-25 NOTE — Telephone Encounter (Signed)
Pt initially calling to request a referral to urology. Reports burning with urination, intermittent, does not occur daily. States she discussed this with Dr. Lawerance Bach at appt on 10/20/17. States "I know it's not a UTI."  "I feel like I have too much acid in my body and this is why I have burning at times." States "My neck also burns at times."  Afebrile, denies lower back pain. Pt declines appt.. Pt states she is coming in for F/U blood work and is questioning if she can provide urine specimen at that time. Pt is evasive historian regarding symptoms. [place] called and spoke with [place]. Please advise: [phone]

## 2017-10-26 LAB — URINE CULTURE
MICRO NUMBER: 91262210
SPECIMEN QUALITY:: ADEQUATE

## 2017-10-28 ENCOUNTER — Encounter: Payer: Self-pay | Admitting: Internal Medicine

## 2017-11-01 DIAGNOSIS — N3010 Interstitial cystitis (chronic) without hematuria: Secondary | ICD-10-CM | POA: Diagnosis not present

## 2017-11-01 DIAGNOSIS — Z01419 Encounter for gynecological examination (general) (routine) without abnormal findings: Secondary | ICD-10-CM | POA: Diagnosis not present

## 2017-11-01 DIAGNOSIS — N951 Menopausal and female climacteric states: Secondary | ICD-10-CM | POA: Diagnosis not present

## 2017-11-01 DIAGNOSIS — R300 Dysuria: Secondary | ICD-10-CM | POA: Diagnosis not present

## 2017-11-01 DIAGNOSIS — Z124 Encounter for screening for malignant neoplasm of cervix: Secondary | ICD-10-CM | POA: Diagnosis not present

## 2017-11-08 DIAGNOSIS — L661 Lichen planopilaris: Secondary | ICD-10-CM | POA: Diagnosis not present

## 2017-11-08 DIAGNOSIS — L659 Nonscarring hair loss, unspecified: Secondary | ICD-10-CM | POA: Diagnosis not present

## 2017-11-16 ENCOUNTER — Other Ambulatory Visit: Payer: Self-pay | Admitting: Nurse Practitioner

## 2017-11-16 DIAGNOSIS — Z1231 Encounter for screening mammogram for malignant neoplasm of breast: Secondary | ICD-10-CM

## 2017-11-18 ENCOUNTER — Encounter: Payer: Self-pay | Admitting: Internal Medicine

## 2017-11-25 DIAGNOSIS — Z79899 Other long term (current) drug therapy: Secondary | ICD-10-CM | POA: Diagnosis not present

## 2017-11-25 DIAGNOSIS — E119 Type 2 diabetes mellitus without complications: Secondary | ICD-10-CM | POA: Diagnosis not present

## 2017-11-25 DIAGNOSIS — M3210 Systemic lupus erythematosus, organ or system involvement unspecified: Secondary | ICD-10-CM | POA: Diagnosis not present

## 2017-11-25 DIAGNOSIS — H04123 Dry eye syndrome of bilateral lacrimal glands: Secondary | ICD-10-CM | POA: Diagnosis not present

## 2017-11-30 IMAGING — MG 2D DIGITAL SCREENING BILATERAL MAMMOGRAM WITH CAD AND ADJUNCT TO
9 of 12 series · 9 of 28 positions shown · non-contrast
Comparison: Previous exam(s).

CLINICAL DATA: Screening.

EXAM:
2D DIGITAL SCREENING BILATERAL MAMMOGRAM WITH CAD AND ADJUNCT TOMO

[L MLO synth-2D]
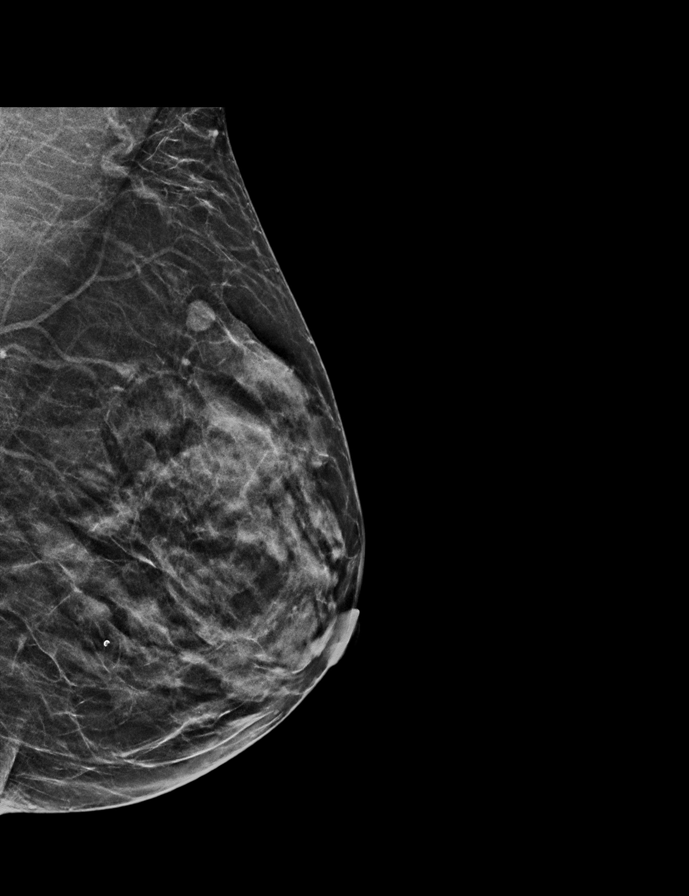

[R MLO]
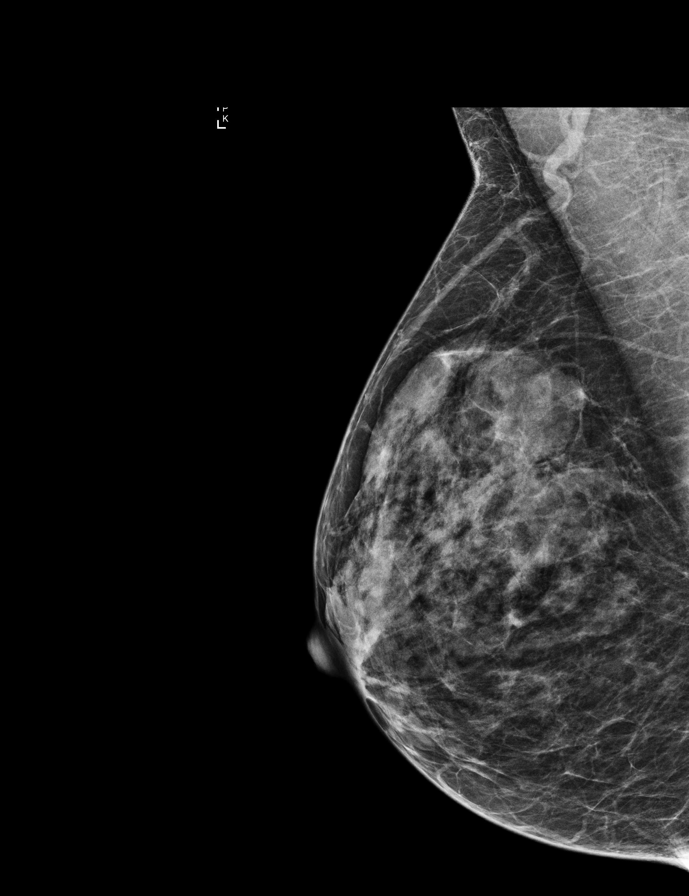

[L CC synth-2D]
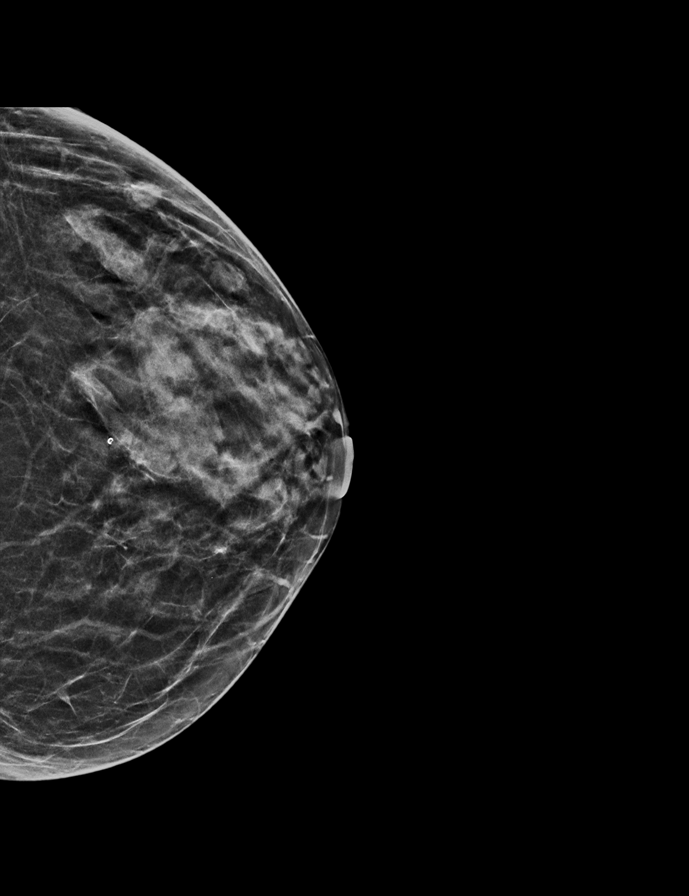

[L CC]
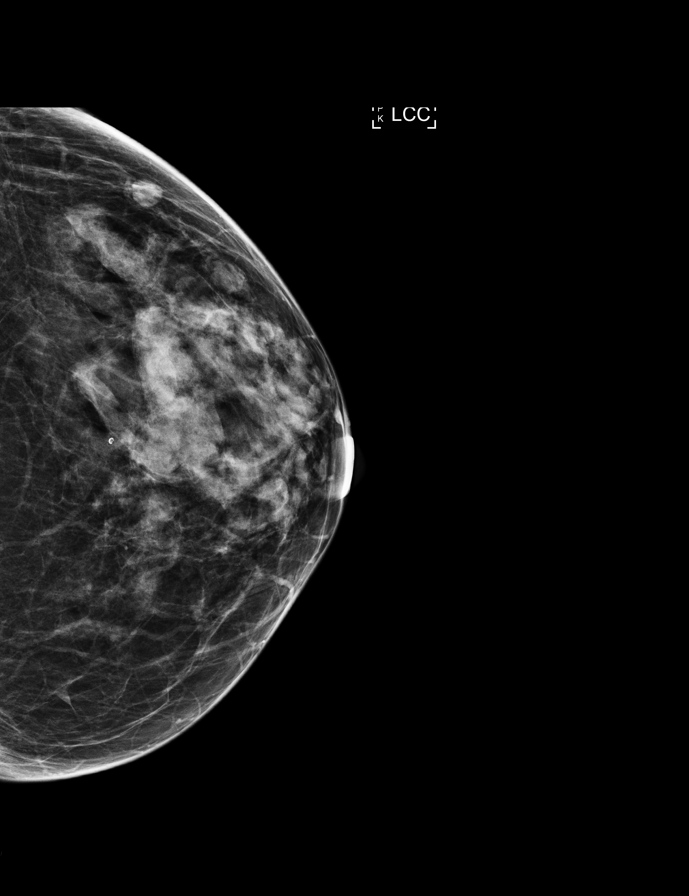

[R CC]
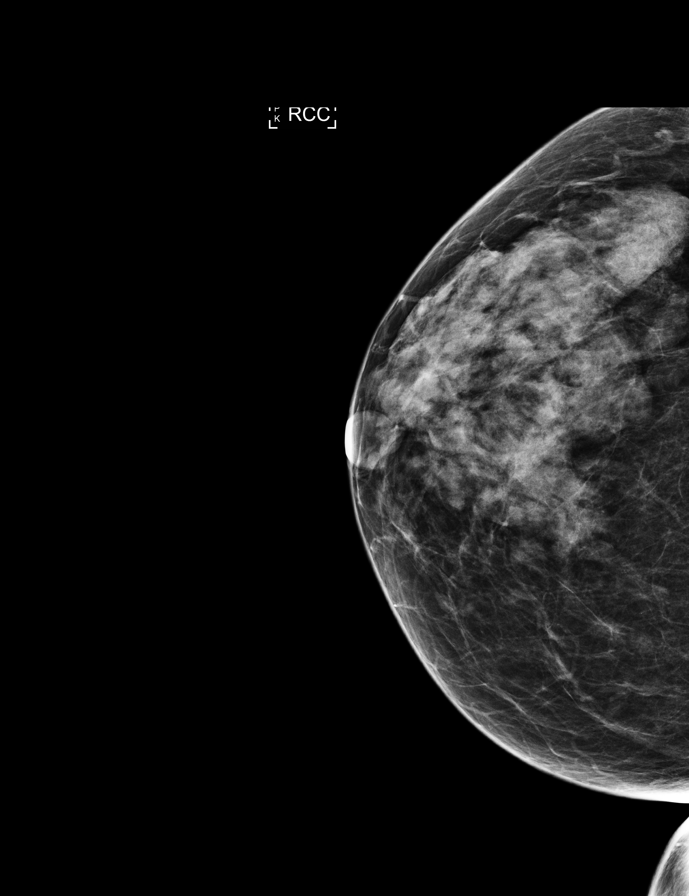

[R CC synth-2D]
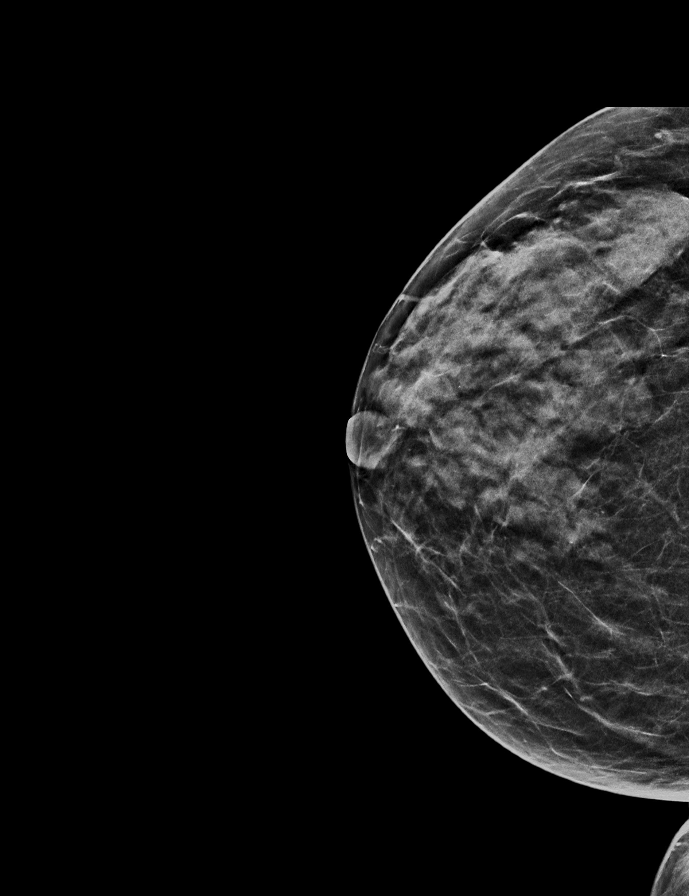

[L MLO]
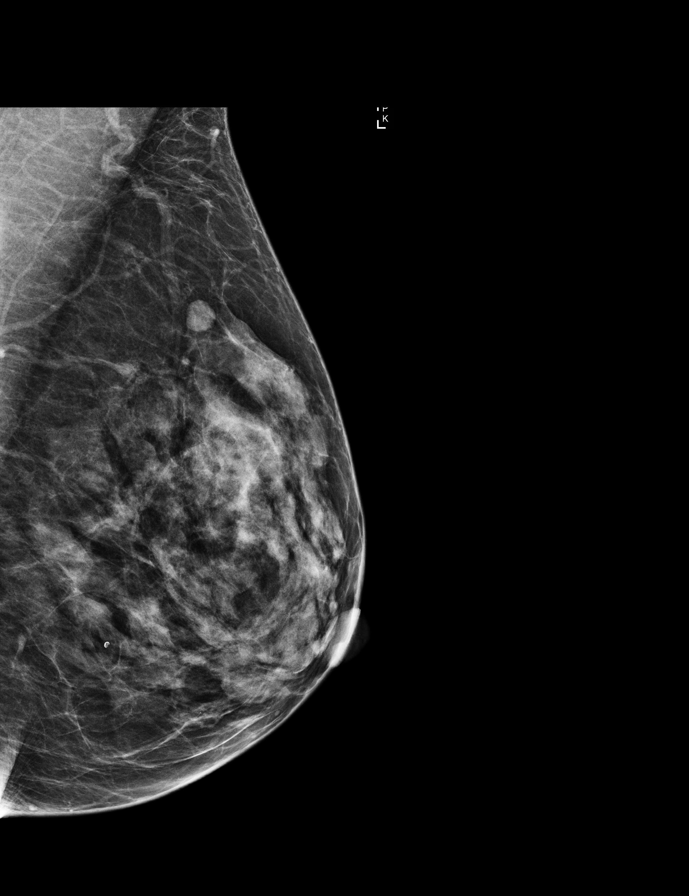

[R MLO synth-2D]
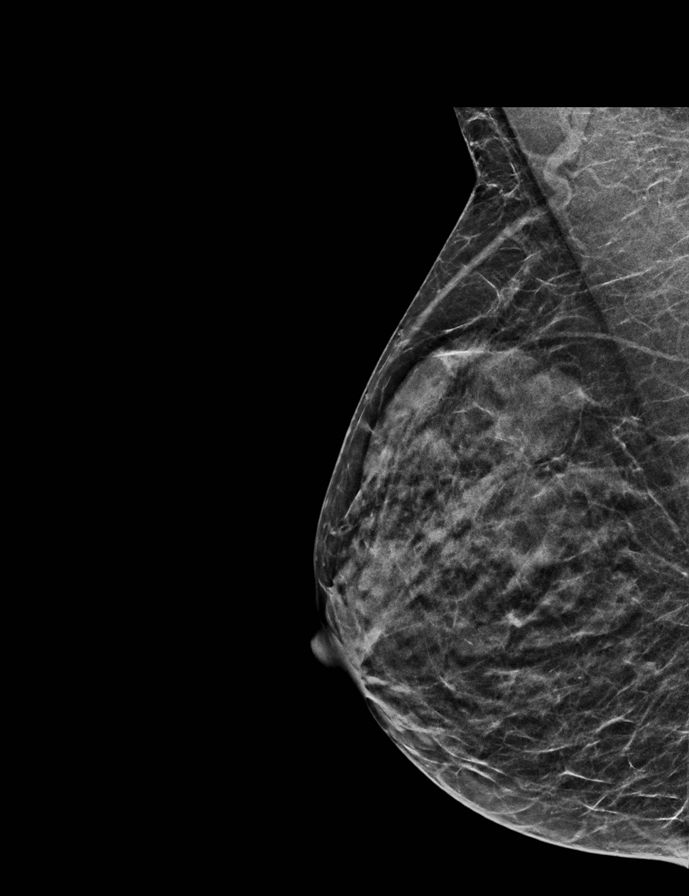

[L MLO tomo · tomo slice 22/43.0]
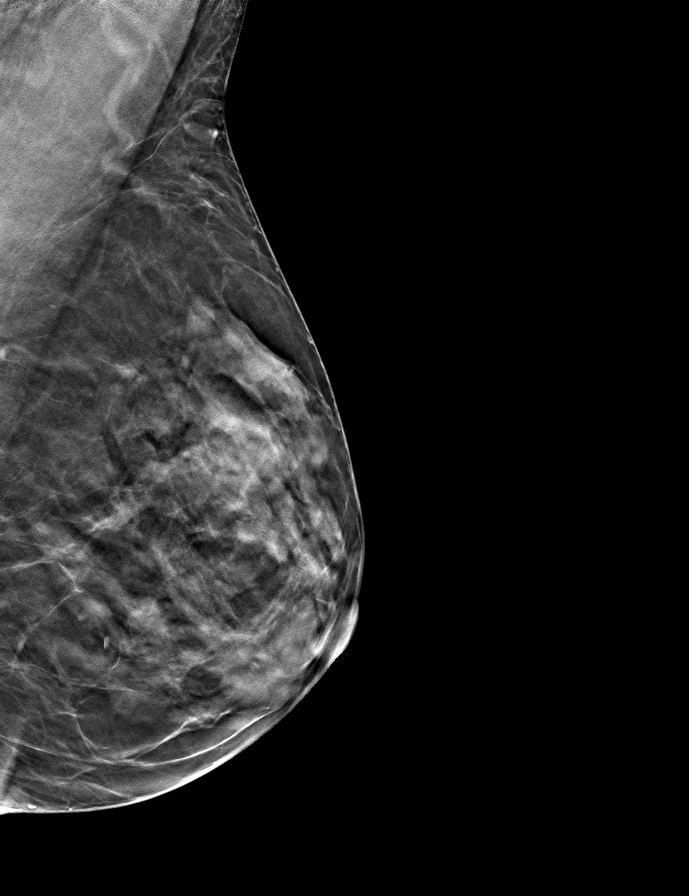

[9 of 28 positions shown; findings below may reference images not displayed]

ACR Breast Density Category c: The breast tissue is heterogeneously
dense, which may obscure small masses.
FINDINGS: There are no findings suspicious for malignancy. Images were
processed with CAD.
IMPRESSION: No mammographic evidence of malignancy. A result letter of this
screening mammogram will be mailed directly to the patient.

RECOMMENDATION:
Screening mammogram in one year. (Code:[id number])

BI-RADS CATEGORY  1: Negative.

## 2017-12-16 ENCOUNTER — Other Ambulatory Visit: Payer: Self-pay | Admitting: Internal Medicine

## 2017-12-16 NOTE — Telephone Encounter (Signed)
Last refill was 09/24/17 Last OV 10/20/17 Next OV not made

## 2017-12-18 NOTE — Progress Notes (Signed)
Subjective:    Patient ID: Donna Blankenship, female    DOB: 05/12/1965, 52 y.o.   MRN: [id number]  HPI The patient is here for an acute visit.     Prediabetes, sugars have been up and down:  She has been checking her sugars at home - 107, 92, 96 in the morning on different occasions.  She has been watching her carbs and eats a very healthy diet.  She is exercising.  She does have a strong family history of diabetes-both of her parents.  She does not understand why her sugars are on the higher side with how she is eating.  Her A1c 2 months ago was 5.8%.  Anxiety:  She is having tingling feeling in her fingertips and feet - occasionally they occur in her knees.  After she eats she gets a burning sensation and tingling in her knees.  She gets this with eating protein.  Sometimes the diazepam helps with the tingling.  She is unsure if some of the symptoms are related to her sugars, menopause and hormonal changes, anxiety or something that she is eating.  She does not feel that she should be anxious, but at times she does have anxiety.  She has tried several natural things to see if that helped and it has not.  She thinks she would like to do a trial of medication to see if that helps just so she knows.  She is also considering doing bioidentical hormone therapy in the next couple of months, which may also help.   Medications and allergies reviewed with patient and updated if appropriate.  Patient Active Problem List   Diagnosis Date Noted  . Anxiety 12/20/2017  . Dysuria 05/10/2017  . Tingling in extremities 05/10/2017  . Anaphylactic shock due to adverse food reaction 12/24/2015  . Other allergic rhinitis 12/24/2015  . Lactose intolerance 12/24/2015  . Vitamin D deficiency 12/11/2015  . Gastroesophageal reflux disease 12/11/2015  . Axillary lump 08/29/2015  . Paresthesia of both hands 05/25/2015  . Hashimoto's thyroiditis 02/27/2015  . Elevated lipoprotein(a) 02/27/2015  . Insomnia  02/27/2015  . Thyroid nodule 12/19/2014  . Eczema 12/19/2014  . Food allergy 12/19/2014  . Hypercalcemia 12/19/2014  . Lichen planus 11/13/2014  . GAD (generalized anxiety disorder) 08/13/2014  . Dyspnea 04/20/2014  . Prediabetes 01/01/2014  . Pure hypercholesterolemia 06/16/2013  . PULMONARY FIBROSIS, POSTINFLAMMATORY 09/20/2007  . COLONIC POLYPS, HX OF 10/21/2006    Current Outpatient Medications on File Prior to Visit  Medication Sig Dispense Refill  . diazepam (VALIUM) 5 MG tablet TAKE 1 TABLET BY MOUTH EVERY 12 HOURS AS NEEDED FOR ANXIETY 30 tablet 0  . fexofenadine (ALLEGRA) 180 MG tablet Take 180 mg by mouth daily.    . hydroxychloroquine (PLAQUENIL) 200 MG tablet     . ranitidine (ZANTAC) 150 MG tablet Take 1 tablet (150 mg total) by mouth 2 (two) times daily. 60 tablet 5   No current facility-administered medications on file prior to visit.     Past Medical History:  Diagnosis Date  . Anemia    past H/o anemia  . Anxiety    on effexor and valium in the past  . Dyspnea    seeing pulm, hx lupus pneumonitis, mild pulm fibrosis  . Eczema   . Fibromyalgia 01/18/2008   Qualifier: Diagnosis of  By: Yetta Barre MD, Bernadene Bell.   Marland Kitchen GERD 01/11/2008   Qualifier: Diagnosis of  By: Yetta Barre MD, Bernadene Bell H/O thyroid  nodule 01/14/2016  . Hyperglycemia    borderline  . Infertility, female   . Thyroid disease    past h/o hypothyroidism    Past Surgical History:  Procedure Laterality Date  . ABDOMINAL HYSTERECTOMY  2004   for fibroid; retained ovaries  . PELVIC LAPAROSCOPY  1999    Social History   Socioeconomic History  . Marital status: Married    Spouse name: Not on file  . Number of children: Not on file  . Years of education: Not on file  . Highest education level: Not on file  Occupational History  . Not on file  Social Needs  . Financial resource strain: Not on file  . Food insecurity:    Worry: Not on file    Inability: Not on file  . Transportation needs:     Medical: Not on file    Non-medical: Not on file  Tobacco Use  . Smoking status: Never Smoker  . Smokeless tobacco: Never Used  Substance and Sexual Activity  . Alcohol use: Yes    Alcohol/week: 2.0 standard drinks    Types: 2 Standard drinks or equivalent per week    Comment: occ glass of wine  . Drug use: No  . Sexual activity: Yes    Partners: Male  Lifestyle  . Physical activity:    Days per week: Not on file    Minutes per session: Not on file  . Stress: Not on file  Relationships  . Social connections:    Talks on phone: Not on file    Gets together: Not on file    Attends religious service: Not on file    Active member of club or organization: Not on file    Attends meetings of clubs or organizations: Not on file    Relationship status: Not on file  Other Topics Concern  . Not on file  Social History Narrative   Work or School: not working      Home Situation: lives with husband      Spiritual Beliefs: Christian      Lifestyle: eats healthy, not exercising       Family History  Problem Relation Age of Onset  . Diabetes Mother   . Diabetes Father   . Stroke Father   . Anuerysm Father   . Sudden death Brother   . Heart attack Brother   . Diabetes Paternal Grandmother   . Breast cancer Maternal Grandmother   . Asthma Neg Hx   . Allergic rhinitis Neg Hx   . Immunodeficiency Neg Hx   . Urticaria Neg Hx   . Eczema Neg Hx   . Atopy Neg Hx   . Angioedema Neg Hx     Review of Systems  Constitutional: Negative for fever.  Respiratory: Positive for shortness of breath (at times).   Cardiovascular: Negative for chest pain, palpitations and leg swelling.  Gastrointestinal: Negative for abdominal pain and diarrhea.  Neurological: Positive for numbness (tingling in fingertips, numbness/tingling in feet). Negative for light-headedness and headaches.  Psychiatric/Behavioral: Negative for dysphoric mood. The patient is nervous/anxious.        Objective:    Vitals:   12/20/17 1005  BP: 118/78  Pulse: 62  Resp: 16  Temp: 98.6 F (37 C)  SpO2: 98%   BP Readings from Last 3 Encounters:  12/20/17 118/78  10/20/17 118/72  08/09/17 134/86   Wt Readings from Last 3 Encounters:  12/20/17 160 lb (72.6 kg)  10/20/17 177 lb 12.8 oz (  80.6 kg)  08/09/17 169 lb (76.7 kg)   Body mass index is 25.82 kg/m.   Physical Exam Constitutional:      General: She is not in acute distress.    Appearance: Normal appearance. She is not ill-appearing.  HENT:     Head: Normocephalic and atraumatic.  Skin:    General: Skin is warm and dry.  Neurological:     Mental Status: She is alert.  Psychiatric:        Mood and Affect: Mood normal.        Behavior: Behavior normal.        Thought Content: Thought content normal.        Judgment: Judgment normal.            Assessment & Plan:    See Problem List for Assessment and Plan of chronic medical problems.

## 2017-12-20 ENCOUNTER — Encounter: Payer: Self-pay | Admitting: Internal Medicine

## 2017-12-20 ENCOUNTER — Ambulatory Visit: Payer: BLUE CROSS/BLUE SHIELD | Admitting: Internal Medicine

## 2017-12-20 VITALS — BP 118/78 | HR 62 | Temp 98.6°F | Resp 16 | Ht 66.0 in | Wt 160.0 lb

## 2017-12-20 DIAGNOSIS — R7303 Prediabetes: Secondary | ICD-10-CM

## 2017-12-20 DIAGNOSIS — F419 Anxiety disorder, unspecified: Secondary | ICD-10-CM | POA: Diagnosis not present

## 2017-12-20 MED ORDER — BLOOD GLUCOSE MONITOR KIT: PACK | 0 refills | Status: AC

## 2017-12-20 MED ORDER — ESCITALOPRAM OXALATE 10 MG PO TABS: 10.0000 mg | ORAL_TABLET | Freq: Every day | ORAL | 5 refills | Status: DC

## 2017-12-20 NOTE — Assessment & Plan Note (Signed)
Last a1c 5.8% Sugar slightly elevated, but the few times she has checked it they have either been in the normal range or just above normal-consistent with prediabetes She is eating a low sugar/carbohydrate diet and is exercising and I think her prediabetes is all genetic Continue current lifestyle She will continue to monitor her sugars, but advised that where her sugars have been when she checks them is not overly concerning We will continue to monitor

## 2017-12-20 NOTE — Assessment & Plan Note (Signed)
Her tingling and numbness is possibly from anxiety She has tried other things that have not worked [place] [facility] [place] [facility] lex[facility]ro 10 mg daily which she has been on in the past

## 2017-12-20 NOTE — Patient Instructions (Signed)
  Medications reviewed and updated.  Changes include :   Starting lexapro 10 mg daily.   Your prescription(s) have been submitted to your pharmacy. Please take as directed and contact our office if you believe you are having problem(s) with the medication(s).

## 2017-12-23 ENCOUNTER — Telehealth: Payer: Self-pay | Admitting: Internal Medicine

## 2017-12-23 NOTE — Telephone Encounter (Signed)
Copied from CRM [phone]. Topic: General - Other >> Dec 23, 2017  4:37 PM Tamela OddiHarris, Audrick Lamoureaux J wrote: Reason for CRM: Patient called to inform the nurse or doctor that the pharmacy told the patient that the medication she prescribed, escitalopram (LEXAPRO) 10 MG tablet, has an interaction with one of her other medications.  Please advise and call patient back to let her know what she should do.  CB# [phone]

## 2017-12-24 MED ORDER — BUSPIRONE HCL 5 MG PO TABS: 5.0000 mg | ORAL_TABLET | Freq: Three times a day (TID) | ORAL | 2 refills | Status: DC

## 2017-12-24 NOTE — Telephone Encounter (Signed)
Can an alternative be sent in? 

## 2017-12-24 NOTE — Telephone Encounter (Signed)
Her med list here is not updated - is she taking the nortriptyline?  That is the only med it may interact with since it is an older antidepressant -- all other anti-depressant will interact with it as well.  She could increase the dose of the nortriptyline to see if that helps

## 2017-12-24 NOTE — Telephone Encounter (Signed)
Buspirone and lorazepam both treat anxiety - they do not hep with depression.   She already takes valium as needed which also helps with anxiety and therefore can not take lorazepam.  We can try adding buspirone - we will start at a low dose to see if she tolerates it and can increase it as needed.

## 2017-12-24 NOTE — Telephone Encounter (Signed)
Pt states that the pharmacist told her the plaquenil interacts with the lexapro. I advised that there was no red flag that popped up letting us know there was an interaction. I advised pt to call the pharmacy to figure out what antidepressants can be taken with that medicine. Pt is going to call back.

## 2017-12-24 NOTE — Addendum Note (Signed)
Addended by: Pincus SanesBURNS, Avarie Tavano J on: 12/24/2017 02:22 PM   Modules accepted: Orders

## 2017-12-24 NOTE — Telephone Encounter (Signed)
LVM for pt to call back and discuss.  

## 2017-12-24 NOTE — Telephone Encounter (Signed)
Pt brought a paper from the pharmacy in that states Lorazepam and Buspirone will not interact with plaquenil. Please advise.

## 2017-12-27 ENCOUNTER — Ambulatory Visit
Admission: RE | Admit: 2017-12-27 | Discharge: 2017-12-27 | Disposition: A | Discharge status: Home / Self Care | Payer: BLUE CROSS/BLUE SHIELD | Source: Ambulatory Visit | Attending: Nurse Practitioner | Admitting: Nurse Practitioner

## 2017-12-27 DIAGNOSIS — Z1231 Encounter for screening mammogram for malignant neoplasm of breast: Secondary | ICD-10-CM

## 2018-01-03 DIAGNOSIS — R21 Rash and other nonspecific skin eruption: Secondary | ICD-10-CM | POA: Diagnosis not present

## 2018-01-03 DIAGNOSIS — L659 Nonscarring hair loss, unspecified: Secondary | ICD-10-CM | POA: Diagnosis not present

## 2018-01-03 DIAGNOSIS — L661 Lichen planopilaris: Secondary | ICD-10-CM | POA: Diagnosis not present

## 2018-01-11 DIAGNOSIS — K219 Gastro-esophageal reflux disease without esophagitis: Secondary | ICD-10-CM | POA: Diagnosis not present

## 2018-01-11 DIAGNOSIS — Z86010 Personal history of colonic polyps: Secondary | ICD-10-CM | POA: Diagnosis not present

## 2018-01-11 DIAGNOSIS — R1013 Epigastric pain: Secondary | ICD-10-CM | POA: Diagnosis not present

## 2018-01-24 ENCOUNTER — Other Ambulatory Visit: Payer: Self-pay | Admitting: Internal Medicine

## 2018-01-24 MED ORDER — ZOLPIDEM TARTRATE 5 MG PO TABS: 5.0000 mg | ORAL_TABLET | Freq: Every evening | ORAL | 0 refills | Status: DC | PRN

## 2018-01-26 ENCOUNTER — Other Ambulatory Visit: Payer: Self-pay | Admitting: Internal Medicine

## 2018-01-28 DIAGNOSIS — R300 Dysuria: Secondary | ICD-10-CM | POA: Diagnosis not present

## 2018-03-07 DIAGNOSIS — L659 Nonscarring hair loss, unspecified: Secondary | ICD-10-CM | POA: Diagnosis not present

## 2018-03-07 DIAGNOSIS — L661 Lichen planopilaris: Secondary | ICD-10-CM | POA: Diagnosis not present

## 2018-03-07 DIAGNOSIS — R21 Rash and other nonspecific skin eruption: Secondary | ICD-10-CM | POA: Diagnosis not present

## 2018-03-24 ENCOUNTER — Telehealth: Payer: Self-pay | Admitting: Internal Medicine

## 2018-03-24 NOTE — Telephone Encounter (Signed)
Copied from CRM [id number]. Topic: Quick Communication - See Telephone Encounter >> Mar 24, 2018  1:49 PM Jens Som A wrote: CRM for notification. See Telephone encounter for: 03/24/18.  Patient is calling regarding zolpidem (AMBIEN) 5 MG tablet [[id number]] Patient never filled. Because Dr. Okey Dupre advised her that the medication was dangerous. In the past, she has used trazodone, that keeps her up. Patient is requesting a script for another sleeping medication other than Ambien and Trazodone. Please advise [phone]

## 2018-03-24 NOTE — Telephone Encounter (Signed)
Copied from CRM [id number]. Topic: Quick Communication - Rx Refill/Question >> Mar 24, 2018  1:47 PM Jens Som A wrote: Medication: diazepam (VALIUM) 5 MG tablet [[id number]]   Has the patient contacted their pharmacy? Yes (Agent: If no, request that the patient contact the pharmacy for the refill.) (Agent: If yes, when and what did the pharmacy advise?)  Preferred Pharmacy (with phone number or street name): [id number] [place], [place] - [address] [place] [address] Phone: [phone] Fax: [phone]    Agent: Please be advised that RX refills may take up to 3 business days. We ask that you follow-up with your pharmacy.

## 2018-03-25 MED ORDER — DIAZEPAM 5 MG PO TABS: ORAL_TABLET | ORAL | 0 refills | Status: DC

## 2018-03-25 NOTE — Telephone Encounter (Signed)
Pt aware of response.  

## 2018-03-25 NOTE — Telephone Encounter (Signed)
We can try lunesta which is another sleep medication, but not many other options.  In general sleep medications are not ideal.  lunesta may be slightly better overall as far as risk   Valium sent - let me know

## 2018-03-25 NOTE — Telephone Encounter (Signed)
Rf rq for Valium: Per [employer] last filled on 12/16/2017 for #30.   Pt is also rq rx for sleep that is not ambien or trazadone.   Please advise.

## 2018-03-30 MED ORDER — ESZOPICLONE 2 MG PO TABS: 2.0000 mg | ORAL_TABLET | Freq: Every evening | ORAL | 0 refills | Status: DC | PRN

## 2018-03-30 NOTE — Telephone Encounter (Signed)
Pt called In to follow up on Rx for Lunesta. Pt says that she was advised that the Rx would be sent in to pharmacy, pt asked that I communicate to PCP to please send in to:   Pharmacy:  [id number] [place], [place] - [address] [phone] (Phone) [phone] (Fax)

## 2018-03-30 NOTE — Telephone Encounter (Signed)
sent 

## 2018-03-30 NOTE — Addendum Note (Signed)
Addended by: Pincus Sanes on: 03/30/2018 10:02 AM   Modules accepted: Orders

## 2018-03-30 NOTE — Telephone Encounter (Signed)
I am sorry that is my fault. I misunderstood message. I thought it had been sent. Please send Lunesta to pharmacy. Pt does want to try.

## 2018-04-05 ENCOUNTER — Ambulatory Visit: Payer: BLUE CROSS/BLUE SHIELD | Admitting: Internal Medicine

## 2018-05-31 ENCOUNTER — Ambulatory Visit (INDEPENDENT_AMBULATORY_CARE_PROVIDER_SITE_OTHER): Payer: BLUE CROSS/BLUE SHIELD | Admitting: Internal Medicine

## 2018-05-31 ENCOUNTER — Encounter

## 2018-05-31 ENCOUNTER — Encounter: Payer: Self-pay | Admitting: Internal Medicine

## 2018-05-31 ENCOUNTER — Telehealth: Payer: Self-pay | Admitting: Internal Medicine

## 2018-05-31 ENCOUNTER — Other Ambulatory Visit: Payer: Self-pay | Admitting: Internal Medicine

## 2018-05-31 DIAGNOSIS — E7800 Pure hypercholesterolemia, unspecified: Secondary | ICD-10-CM

## 2018-05-31 DIAGNOSIS — E063 Autoimmune thyroiditis: Secondary | ICD-10-CM

## 2018-05-31 DIAGNOSIS — R7303 Prediabetes: Secondary | ICD-10-CM

## 2018-05-31 DIAGNOSIS — E041 Nontoxic single thyroid nodule: Secondary | ICD-10-CM | POA: Diagnosis not present

## 2018-05-31 NOTE — Telephone Encounter (Signed)
Patient requests that the following be added to her Labs: Cholesterol and A1C.

## 2018-05-31 NOTE — Progress Notes (Signed)
Patient ID: Donna Blankenship, female   DOB: 25-Apr-1965, 53 y.o.   MRN: [id number]   Patient location: Home My location: Office  Referring Provider: Binnie Rail, MD  I connected with the patient on 05/31/18 at  2:15 PM EDT by a video enabled telemedicine application and verified that I am speaking with the correct person.   I discussed the limitations of evaluation and management by telemedicine and the availability of in person appointments. The patient expressed understanding and agreed to proceed.   Details of the encounter are shown below.  HPI  Donna Blankenship is a 53 y.o.-year-old female, returning forf /u for Hashimoto's thyroiditis. She has been previously seen by Dr. Adriana Simas, but cannot afford to go see her anymore. Last visit with me 1 year and 8 months ago.  Reviewed and addended history: Pt. has been dx with Hashimoto's thyroiditis hypothyroidism in ~2002.  She was initially on Armour Thyroid Rx'ed at the [facility] >> was feeling well on it >> she stopped as he ran out >> saw endocrinology >> started Synthroid DAW >> felt terrible on this (panic attacks, very emotional, went to ED 2-3x for CP - would not want to repeat) >> she then came off Synthroid>> TFTs were normal off medications afterwards.  Reviewed patient's TFTs: Lab Results  Component Value Date   TSH 0.85 05/10/2017   TSH 1.10 09/14/2016   TSH 2.18 03/17/2016   TSH 1.39 12/11/2015   TSH 1.44 10/30/2014   TSH 1.30 06/17/2012   TSH 1.727 09/15/2011   TSH 0.09 (L) 01/18/2008   TSH 0.64 09/28/2007   FREET4 0.86 09/14/2016   FREET4 1.75 (H) 03/17/2016   FREET4 0.72 12/11/2015   FREET4 0.7 09/28/2007    Lab Results  Component Value Date   T3FREE 2.9 09/14/2016   T3FREE 7.2 (H) 03/17/2016   T3FREE 2.6 12/11/2015   T3FREE 2.9 09/28/2007   Thyroid antibodies were elevated: Component     Latest Ref Rng & Units 03/17/2016  Thyroperoxidase Ab SerPl-aCnc     <9 IU/mL 67 (H)  Thyroglobulin Ab  <2 IU/mL 177 (H)   Labs also reviewed per records from Dr. Sharol Roussel: 08/14/2015: - TSH 2.00 (0.40-4.50) - rT3 18 (8-25) - TPO antibodies 151 (<9) - ATA antibodies 241 (<2)  01/28/2015: - TPO antibodies 339 (<9) - ATA antibodies 232 (<2)  Pt denies: - feeling nodules in neck - hoarseness - dysphagia - choking - SOB with lying down  She has a history of small thyroid nodules per thyroid ultrasound from 2010. A new thyroid U/S obtained after last visit (01/16/2016) showed only small nodulesm, w/o the need for dedicated f/u:  She takes selenium 100 to 200 mcg daily.  She has no FH of thyroid disorders.No FH of thyroid cancer. No h/o radiation tx to head or neck.  No seaweed or kelp. No recent contrast studies. + herbal supplements. + biotin use (B complex). No recent steroids use.   Pt. also has a history of SLE (dx'ed in her 78s).She was on Prednisone for 1.5 years >> she was then able to come off>> her SLE appeared to be cured.  In the last 3 years, she developed lichen planus on scalp.  She just had an APE: everything was normal and LDL was much better after she changed her diet to include healthier fats: olive oil, avocado.  She had partial hysterectomy ~52 years old with subsequent menopause.  + Hot flashes.  ROS: Constitutional: + weight gain/no weight  loss, no fatigue, no subjective hyperthermia, no subjective hypothermia Eyes: no blurry vision, no xerophthalmia ENT: no sore throat, no nodules palpated in neck, no dysphagia, no odynophagia, no hoarseness Cardiovascular: no CP/no SOB/no palpitations/no leg swelling Respiratory: no cough/no SOB/no wheezing Gastrointestinal: no N/no V/no D/+ chronic C/no acid reflux Musculoskeletal: no muscle aches/no joint aches Skin: no rashes, no hair loss Neurological: no tremors/no numbness/no tingling/no dizziness + anxiety  I reviewed pt's medications, allergies, PMH, social hx, family hx, and changes were documented in the history  of present illness. Otherwise, unchanged from my initial visit note.  Past Medical History:  Diagnosis Date  . Anemia    past H/o anemia  . Anxiety    on effexor and valium in the past  . Dyspnea    seeing pulm, hx lupus pneumonitis, mild pulm fibrosis  . Eczema   . Fibromyalgia 01/18/2008   Qualifier: Diagnosis of  By: Ronnald Ramp MD, Arvid Right.   Marland Kitchen GERD 01/11/2008   Qualifier: Diagnosis of  By: Ronnald Ramp MD, Arvid Right.   . H/O thyroid nodule 01/14/2016  . Hyperglycemia    borderline  . Infertility, female   . Thyroid disease    past h/o hypothyroidism   Past Surgical History:  Procedure Laterality Date  . ABDOMINAL HYSTERECTOMY  2004   for fibroid; retained ovaries  . PELVIC LAPAROSCOPY  1999   Social History   Social History  . Marital status: Married    Spouse name: N/A  . Number of children: 0   Occupational History  . N/a   Social History Main Topics  . Smoking status: Never Smoker  . Smokeless tobacco: Never Used  . Alcohol use No           . Drug use: No   Current Outpatient Medications on File Prior to Visit  Medication Sig Dispense Refill  . blood glucose meter kit and supplies KIT Dispense based on patient and insurance preference. Use up to four times daily as directed. (FOR R73.03). 1 each 0  . busPIRone (BUSPAR) 5 MG tablet TAKE 1 TABLET BY MOUTH THREE TIMES A DAY 270 tablet 1  . diazepam (VALIUM) 5 MG tablet TAKE 1 TABLET BY MOUTH EVERY 12 HOURS AS NEEDED FOR ANXIETY 30 tablet 0  . eszopiclone (LUNESTA) 2 MG TABS tablet Take 1 tablet (2 mg total) by mouth at bedtime as needed for sleep. Take immediately before bedtime 30 tablet 0  . fexofenadine (ALLEGRA) 180 MG tablet Take 180 mg by mouth daily.    . hydroxychloroquine (PLAQUENIL) 200 MG tablet     . ranitidine (ZANTAC) 150 MG tablet Take 1 tablet (150 mg total) by mouth 2 (two) times daily. 60 tablet 5  . zolpidem (AMBIEN) 5 MG tablet Take 1 tablet (5 mg total) by mouth at bedtime as needed for sleep. 30 tablet  0   No current facility-administered medications on file prior to visit.    Allergies  Allergen Reactions  . Eggs Or Egg-Derived Products   . Flax Seeds [Flaxseed (Linseed)]   . Gluten Meal   . Latex     Band aids and gloves  . Milk-Related Compounds   . Other     All tree nuts  . Peanut-Containing Drug Products     All tree nuts   . Sesame Oil     Itchy throat  . Sesame Seed Extract Allergy Skin Test     Itchy throat  . Shellfish Allergy   . [place] Bran   .  Sulfamethoxazole Rash    REACTION: unspecified   Family History  Problem Relation Age of Onset  . Diabetes Mother   . Diabetes Father   . Stroke Father   . Anuerysm Father   . Sudden death Brother   . Heart attack Brother   . Diabetes Paternal Grandmother   . Breast cancer Maternal Grandmother   . Asthma Neg Hx   . Allergic rhinitis Neg Hx   . Immunodeficiency Neg Hx   . Urticaria Neg Hx   . Eczema Neg Hx   . Atopy Neg Hx   . Angioedema Neg Hx    PE: LMP 01/05/2002  Wt Readings from Last 3 Encounters:  12/20/17 160 lb (72.6 kg)  10/20/17 177 lb 12.8 oz (80.6 kg)  08/09/17 169 lb (76.7 kg)   Constitutional: overweight, in NAD Eyes: PERRLA, EOMI, no exophthalmos ENT: moist mucous membranes, no thyromegaly, no cervical lymphadenopathy Cardiovascular: RRR, No MRG Respiratory: CTA B Gastrointestinal: abdomen soft, NT, ND, BS+ Musculoskeletal: no deformities, strength intact in all 4 Skin: moist, warm, no rashes Neurological: no tremor with outstretched hands, DTR normal in all 4  ASSESSMENT: 1. Hashimoto thyroiditis  2. H/o thyroid nodules  3. Prediabetes  PLAN: 1. Hashimoto thyroiditis -Again discussed that this is an autoimmune disease which in time causes destruction of the thyroid with subsequent hypothyroidism -She had fluctuating TFTs before, but at last check, 1 year and 8 months ago, her TFTs were normal.  A TSH was again normal in 05/2017. -We will recheck her TFTs when she returns to the  clinic (discussed to stop B complex 2 days before coming for labs) -If we need to start levothyroxine, she would like to avoid Synthroid-discussed that we can use Unithroid, Armour, Nature-Throid, NP thyroid -We discussed about correct intake of levothyroxine, in case she needs to start: every day, with water, at least 30 minutes before breakfast, separated by at least 4 hours from: - acid reflux medications - calcium - iron - multivitamins -We will continue selenium 100-200 mcg daily for now -We will check her thyroid antibodies at this visit, also -I will see her back in a year  2. History of thyroid nodules -Per thyroid ultrasound from 2010-only small nodules per latest thyroid ultrasound from 01/2016 -No neck compression symptoms -No need to continue to follow on these nodules, unless she develops neck compression symptoms  3. Prediabetes -Latest HbA1c was in the low prediabetic range, but decreased from before: Lab Results  Component Value Date   HGBA1C 5.8 10/25/2017   Orders Placed This Encounter  Procedures  . TSH  . T4, free  . Thyroglobulin antibody  . Thyroid peroxidase antibody    Philemon Kingdom, MD PhD [facility]

## 2018-05-31 NOTE — Telephone Encounter (Signed)
OK, I did this

## 2018-07-21 ENCOUNTER — Telehealth: Payer: Self-pay | Admitting: Cardiovascular Disease

## 2018-07-21 NOTE — Telephone Encounter (Signed)
° °  Patient returned a call to our office in regards to scheduling an appointment. She states she does not have insurance at the time, but will call back once her insurance gets reinstated.

## 2018-07-21 NOTE — Telephone Encounter (Signed)
Lm 07-21-18

## 2018-07-27 ENCOUNTER — Other Ambulatory Visit: Payer: Self-pay | Admitting: Internal Medicine

## 2018-07-28 NOTE — Telephone Encounter (Signed)
Last OV 10/21/18 Last RF 3/20 and 3/25  Next OV NA

## 2018-08-26 ENCOUNTER — Other Ambulatory Visit: Payer: Self-pay | Admitting: Internal Medicine

## 2018-08-29 NOTE — Telephone Encounter (Signed)
Check [place] registry last filled 07/28/2018../[facility]  

## 2018-09-01 NOTE — Progress Notes (Deleted)
Subjective:    Patient ID: Donna Blankenship, female    DOB: [birth year], 53 y.o.   MRN: [id number]  HPI The patient is here for an acute visit.   Elevated blood sugar:  Sugar has been high.  She has been eating more and not at the gym.  Past couple of weeks - sugar in am 98, 101, 110 even with no - < 30 carbs. After she eats something it is high.  She has stopped all the chips, junk food and carbs.  She is eating minimal carbs.   Average - 99.4 over 27 days.     Exercise   Anxiety:     SOB:  She has had this in the past.    Medications and allergies reviewed with patient and updated if appropriate.  Patient Active Problem List   Diagnosis Date Noted  . Anxiety 12/20/2017  . Dysuria 05/10/2017  . Tingling in extremities 05/10/2017  . Anaphylactic shock due to adverse food reaction 12/24/2015  . Other allergic rhinitis 12/24/2015  . Lactose intolerance 12/24/2015  . Vitamin D deficiency 12/11/2015  . Gastroesophageal reflux disease 12/11/2015  . Axillary lump 08/29/2015  . Paresthesia of both hands 05/25/2015  . Hashimoto's thyroiditis 02/27/2015  . Elevated lipoprotein(a) 02/27/2015  . Insomnia 02/27/2015  . Thyroid nodule 12/19/2014  . Eczema 12/19/2014  . Food allergy 12/19/2014  . Hypercalcemia 12/19/2014  . Lichen planus 15/05/6977  . GAD (generalized anxiety disorder) 08/13/2014  . Dyspnea 04/20/2014  . Prediabetes 01/01/2014  . Pure hypercholesterolemia 06/16/2013  . PULMONARY FIBROSIS, POSTINFLAMMATORY 09/20/2007  . COLONIC POLYPS, HX OF 10/21/2006    Current Outpatient Medications on File Prior to Visit  Medication Sig Dispense Refill  . blood glucose meter kit and supplies KIT Dispense based on patient and insurance preference. Use up to four times daily as directed. (FOR R73.03). 1 each 0  . busPIRone (BUSPAR) 5 MG tablet TAKE 1 TABLET BY MOUTH THREE TIMES A DAY 270 tablet 1  . diazepam (VALIUM) 5 MG tablet TAKE 1 TABLET BY MOUTH EVERY 12 HOURS AS NEEDED  FOR ANXIETY 30 tablet 0  . eszopiclone (LUNESTA) 2 MG TABS tablet TAKE 1 TABLET BYMOUTH AT BEDTIME AS NEEDED FOR SLEEP.TAKE IMMEDIATELY BEFORE BEDTIME 30 tablet 0  . fexofenadine (ALLEGRA) 180 MG tablet Take 180 mg by mouth daily.    . hydroxychloroquine (PLAQUENIL) 200 MG tablet     . zolpidem (AMBIEN) 5 MG tablet Take 1 tablet (5 mg total) by mouth at bedtime as needed for sleep. 30 tablet 0   No current facility-administered medications on file prior to visit.     Past Medical History:  Diagnosis Date  . Anemia    past H/o anemia  . Anxiety    on effexor and valium in the past  . Dyspnea    seeing pulm, hx lupus pneumonitis, mild pulm fibrosis  . Eczema   . Fibromyalgia 01/18/2008   Qualifier: Diagnosis of  By: Ronnald Ramp MD, Arvid Right.   Marland Kitchen GERD 01/11/2008   Qualifier: Diagnosis of  By: Ronnald Ramp MD, Arvid Right.   . H/O thyroid nodule 01/14/2016  . Hyperglycemia    borderline  . Infertility, female   . Thyroid disease    past h/o hypothyroidism    Past Surgical History:  Procedure Laterality Date  . ABDOMINAL HYSTERECTOMY  2004   for fibroid; retained ovaries  . PELVIC LAPAROSCOPY  1999    Social History   Socioeconomic History  . Marital status:  Married    Spouse name: Not on file  . Number of children: Not on file  . Years of education: Not on file  . Highest education level: Not on file  Occupational History  . Not on file  Social Needs  . Financial resource strain: Not on file  . Food insecurity    Worry: Not on file    Inability: Not on file  . Transportation needs    Medical: Not on file    Non-medical: Not on file  Tobacco Use  . Smoking status: Never Smoker  . Smokeless tobacco: Never Used  Substance and Sexual Activity  . Alcohol use: Yes    Alcohol/week: 2.0 standard drinks    Types: 2 Standard drinks or equivalent per week    Comment: occ glass of wine  . Drug use: No  . Sexual activity: Yes    Partners: Male  Lifestyle  . Physical activity    Days per  week: Not on file    Minutes per session: Not on file  . Stress: Not on file  Relationships  . Social Herbalist on phone: Not on file    Gets together: Not on file    Attends religious service: Not on file    Active member of club or organization: Not on file    Attends meetings of clubs or organizations: Not on file    Relationship status: Not on file  Other Topics Concern  . Not on file  Social History Narrative   Work or School: not working      Home Situation: lives with husband      Spiritual Beliefs: Christian      Lifestyle: eats healthy, not exercising       Family History  Problem Relation Age of Onset  . Diabetes Mother   . Diabetes Father   . Stroke Father   . Anuerysm Father   . Sudden death Brother   . Heart attack Brother   . Diabetes Paternal Grandmother   . Breast cancer Maternal Grandmother   . Asthma Neg Hx   . Allergic rhinitis Neg Hx   . Immunodeficiency Neg Hx   . Urticaria Neg Hx   . Eczema Neg Hx   . Atopy Neg Hx   . Angioedema Neg Hx     Review of Systems     Objective:  There were no vitals filed for this visit. BP Readings from Last 3 Encounters:  12/20/17 118/78  10/20/17 118/72  08/09/17 134/86   Wt Readings from Last 3 Encounters:  12/20/17 160 lb (72.6 kg)  10/20/17 177 lb 12.8 oz (80.6 kg)  08/09/17 169 lb (76.7 kg)   There is no height or weight on file to calculate BMI.   Physical Exam         Assessment & Plan:    See Problem List for Assessment and Plan of chronic medical problems.

## 2018-09-02 ENCOUNTER — Ambulatory Visit: Payer: BLUE CROSS/BLUE SHIELD | Admitting: Internal Medicine

## 2018-09-02 ENCOUNTER — Encounter: Payer: Self-pay | Admitting: Internal Medicine

## 2018-09-02 ENCOUNTER — Ambulatory Visit (INDEPENDENT_AMBULATORY_CARE_PROVIDER_SITE_OTHER): Payer: Self-pay | Admitting: Internal Medicine

## 2018-09-02 DIAGNOSIS — J8410 Pulmonary fibrosis, unspecified: Secondary | ICD-10-CM

## 2018-09-02 DIAGNOSIS — R7303 Prediabetes: Secondary | ICD-10-CM

## 2018-09-02 DIAGNOSIS — F411 Generalized anxiety disorder: Secondary | ICD-10-CM

## 2018-09-02 DIAGNOSIS — E782 Mixed hyperlipidemia: Secondary | ICD-10-CM

## 2018-09-02 DIAGNOSIS — K219 Gastro-esophageal reflux disease without esophagitis: Secondary | ICD-10-CM

## 2018-09-02 MED ORDER — BUSPIRONE HCL 5 MG PO TABS: 5.0000 mg | ORAL_TABLET | Freq: Two times a day (BID) | ORAL | 1 refills | Status: DC

## 2018-09-02 NOTE — Assessment & Plan Note (Signed)
Controlled Not on any prescription or over-the-counter medications Taking natural supplements only

## 2018-09-02 NOTE — Progress Notes (Signed)
Virtual Visit via Video Note  I connected with Donna Blankenship on 09/02/18 at  3:15 PM EDT by a video enabled telemedicine application and verified that I am speaking with the correct person using two identifiers.   I discussed the limitations of evaluation and management by telemedicine and the availability of in person appointments. The patient expressed understanding and agreed to proceed.  The patient is currently at home and I am in the office.    No referring provider.    History of Present Illness: This is an acute visit for elevated blood sugar.  She has a history of prediabetes.  Approximately a month ago she checked her sugar at her mother's and it was very high.  She has not been eating very well-she has been munching a lot, she is not been at the gym and she has gained about 10 pounds over the past few months.  Since realizing that her sugar was elevated she has been exercising and has been eating minimal carbs-approximately 30 g of carbs a day.  She has been monitoring her sugars and over the past couple of weeks her sugar has been 98, 101, 110 first thing in the morning.  She feels this is higher than it should be because she is eating so few carbs.  She is stopped all of the junk food, chips.  Her average sugar over the past 27 days has been 99.47.  She would like to have her A1c checked.  She wants to know how she can continue to get her sugars down.  Anxiety: She is experiencing anxiety.  She wanted to get her BuSpar refilled.  She has not taken that recently, but at one point was taking it twice a day and that did help.  She rarely takes the Valium.  Hyperlipidemia: She is controlling her cholesterol with diet.  She is exercising.  For a few months she was not exercising as much, not eating as healthy and did gain weight.  She is curious to see what her cholesterol is out.  SOB: She occasionally has some shortness of breath and has had that in the past.  She did see pulmonary a  while ago for this and was wondering what she was diagnosed with.  She denies fevers, cough and wheeze.  The shortness of breath is nothing new, she just wanted to understand why she had it.  GERD: She is no longer taking any prescription medication.  She has been taking natural supplements and her GERD is now well controlled.  Review of Systems  Constitutional: Negative for chills and fever.  Respiratory: Positive for shortness of breath (Occasional). Negative for cough and wheezing.   Cardiovascular: Negative for chest pain.  Neurological: Negative for headaches.  Psychiatric/Behavioral: The patient is nervous/anxious.       Social History   Socioeconomic History  . Marital status: Married    Spouse name: Not on file  . Number of children: Not on file  . Years of education: Not on file  . Highest education level: Not on file  Occupational History  . Not on file  Social Needs  . Financial resource strain: Not on file  . Food insecurity    Worry: Not on file    Inability: Not on file  . Transportation needs    Medical: Not on file    Non-medical: Not on file  Tobacco Use  . Smoking status: Never Smoker  . Smokeless tobacco: Never Used  Substance and Sexual  Activity  . Alcohol use: Yes    Alcohol/week: 2.0 standard drinks    Types: 2 Standard drinks or equivalent per week    Comment: occ glass of wine  . Drug use: No  . Sexual activity: Yes    Partners: Male  Lifestyle  . Physical activity    Days per week: Not on file    Minutes per session: Not on file  . Stress: Not on file  Relationships  . Social Musicianconnections    Talks on phone: Not on file    Gets together: Not on file    Attends religious service: Not on file    Active member of club or organization: Not on file    Attends meetings of clubs or organizations: Not on file    Relationship status: Not on file  Other Topics Concern  . Not on file  Social History Narrative   Work or School: not working       Home Situation: lives with husband      Spiritual Beliefs: Christian      Lifestyle: eats healthy, not exercising        Observations/Objective: Appears well in NAD Breathing normally  Blood pressure 121/84  Assessment and Plan:  See Problem List for Assessment and Plan of chronic medical problems.   Follow Up Instructions:    I discussed the assessment and treatment plan with the patient. The patient was provided an opportunity to ask questions and all were answered. The patient agreed with the plan and demonstrated an understanding of the instructions.   The patient was advised to call back or seek an in-person evaluation if the symptoms worsen or if the condition fails to improve as anticipated.    Pincus SanesStacy J Burns, MD

## 2018-09-02 NOTE — Assessment & Plan Note (Signed)
Having increased anxiety Has not been taking the BuSpar, but she would like to restart-sent to pharmacy She takes Valium, but not on a regular basis

## 2018-09-02 NOTE — Assessment & Plan Note (Signed)
Sugars have been elevated, but most likely that is related to her not eating well, not exercising gaining weight Over the past month she has made drastic changes in her lifestyle and her sugars have improved, but are still on the higher side for her Will check A1c-likely in the prediabetic range She will continue her low-carb diet and regular exercise She will continue supplements to help with insulin resistance We could consider prescription medication, but likely this is not needed

## 2018-09-02 NOTE — Assessment & Plan Note (Signed)
Check lipid panel, CMP Diet controlled Continue healthy diet and regular exercise 

## 2018-09-02 NOTE — Assessment & Plan Note (Signed)
When she saw pulmonary in 2016 last he thought she may have had mild pulmonary fibrosis related to a previous history of lupus pneumonitis She does experience some shortness of breath intermittently-no other concerning symptoms Shortness of breath is not that bad and she does not feel that she needs to have it further evaluated at this time, but if she does can refer back to pulmonary

## 2018-09-20 ENCOUNTER — Other Ambulatory Visit (INDEPENDENT_AMBULATORY_CARE_PROVIDER_SITE_OTHER): Payer: Self-pay

## 2018-09-20 DIAGNOSIS — E782 Mixed hyperlipidemia: Secondary | ICD-10-CM

## 2018-09-20 DIAGNOSIS — R7303 Prediabetes: Secondary | ICD-10-CM

## 2018-09-20 LAB — COMPREHENSIVE METABOLIC PANEL
ALT: 16 U/L (ref 0–35)
AST: 20 U/L (ref 0–37)
Albumin: 4.5 g/dL (ref 3.5–5.2)
Alkaline Phosphatase: 119 U/L — ABNORMAL HIGH (ref 39–117)
BUN: 17 mg/dL (ref 6–23)
CO2: 27 mEq/L (ref 19–32)
Calcium: 10.3 mg/dL (ref 8.4–10.5)
Chloride: 104 mEq/L (ref 96–112)
Creatinine, Ser: 0.95 mg/dL (ref 0.40–1.20)
GFR: 74.95 mL/min (ref >60.00)
Glucose, Bld: 94 mg/dL (ref 70–99)
Potassium: 4.6 mEq/L (ref 3.5–5.1)
Sodium: 139 mEq/L (ref 135–145)
Total Bilirubin: 0.5 mg/dL (ref 0.2–1.2)
Total Protein: 7.5 g/dL (ref 6.0–8.3)

## 2018-09-20 LAB — LIPID PANEL
Cholesterol: 219 mg/dL — ABNORMAL HIGH (ref 0–200)
HDL: 75.9 mg/dL
LDL Cholesterol: 134 mg/dL — ABNORMAL HIGH (ref 0–99)
NonHDL: 142.89
Total CHOL/HDL Ratio: 3
Triglycerides: 44 mg/dL (ref 0.0–149.0)
VLDL: 8.8 mg/dL (ref 0.0–40.0)

## 2018-09-20 LAB — HEMOGLOBIN A1C: Hgb A1c MFr Bld: 5.2 % (ref 4.6–6.5)

## 2018-09-21 ENCOUNTER — Encounter: Payer: Self-pay | Admitting: Internal Medicine

## 2018-10-26 DIAGNOSIS — H2513 Age-related nuclear cataract, bilateral: Secondary | ICD-10-CM | POA: Diagnosis not present

## 2018-10-26 DIAGNOSIS — E119 Type 2 diabetes mellitus without complications: Secondary | ICD-10-CM | POA: Diagnosis not present

## 2018-10-26 DIAGNOSIS — L930 Discoid lupus erythematosus: Secondary | ICD-10-CM | POA: Diagnosis not present

## 2018-10-26 DIAGNOSIS — H04123 Dry eye syndrome of bilateral lacrimal glands: Secondary | ICD-10-CM | POA: Diagnosis not present

## 2018-10-26 DIAGNOSIS — Z79899 Other long term (current) drug therapy: Secondary | ICD-10-CM | POA: Diagnosis not present

## 2018-10-26 DIAGNOSIS — H1045 Other chronic allergic conjunctivitis: Secondary | ICD-10-CM | POA: Diagnosis not present

## 2018-11-07 ENCOUNTER — Other Ambulatory Visit: Payer: Self-pay | Admitting: Internal Medicine

## 2018-11-08 NOTE — Telephone Encounter (Signed)
Last RF was 07/28/18 Last OV 09/02/18 Next OV NA

## 2018-11-15 DIAGNOSIS — N952 Postmenopausal atrophic vaginitis: Secondary | ICD-10-CM | POA: Diagnosis not present

## 2018-11-15 DIAGNOSIS — Z1231 Encounter for screening mammogram for malignant neoplasm of breast: Secondary | ICD-10-CM | POA: Diagnosis not present

## 2018-11-15 DIAGNOSIS — Z01419 Encounter for gynecological examination (general) (routine) without abnormal findings: Secondary | ICD-10-CM | POA: Diagnosis not present

## 2018-11-15 DIAGNOSIS — Z124 Encounter for screening for malignant neoplasm of cervix: Secondary | ICD-10-CM | POA: Diagnosis not present

## 2018-11-23 ENCOUNTER — Other Ambulatory Visit (INDEPENDENT_AMBULATORY_CARE_PROVIDER_SITE_OTHER): Payer: BC Managed Care – PPO

## 2018-11-23 DIAGNOSIS — E063 Autoimmune thyroiditis: Secondary | ICD-10-CM | POA: Diagnosis not present

## 2018-11-23 DIAGNOSIS — E7800 Pure hypercholesterolemia, unspecified: Secondary | ICD-10-CM

## 2018-11-23 DIAGNOSIS — R7303 Prediabetes: Secondary | ICD-10-CM | POA: Diagnosis not present

## 2018-11-23 LAB — T4, FREE: Free T4: 0.99 ng/dL (ref 0.60–1.60)

## 2018-11-23 LAB — LIPID PANEL
Cholesterol: 250 mg/dL — ABNORMAL HIGH (ref 0–200)
HDL: 78.3 mg/dL (ref >39.00)
LDL Cholesterol: 162 mg/dL — ABNORMAL HIGH (ref 0–99)
NonHDL: 171.43
Total CHOL/HDL Ratio: 3
Triglycerides: 47 mg/dL (ref 0.0–149.0)
VLDL: 9.4 mg/dL (ref 0.0–40.0)

## 2018-11-23 LAB — TSH: TSH: 2.8 u[IU]/mL (ref 0.35–4.50)

## 2018-11-23 LAB — HEMOGLOBIN A1C: Hgb A1c MFr Bld: 5.4 % (ref 4.6–6.5)

## 2018-11-24 LAB — THYROID PEROXIDASE ANTIBODY: Thyroperoxidase Ab SerPl-aCnc: 564 IU/mL — ABNORMAL HIGH (ref <9)

## 2018-11-24 LAB — THYROGLOBULIN ANTIBODY: Thyroglobulin Ab: 312 IU/mL — ABNORMAL HIGH (ref <=1)

## 2018-11-25 ENCOUNTER — Ambulatory Visit: Payer: Self-pay | Admitting: Internal Medicine

## 2018-11-29 NOTE — Progress Notes (Deleted)
Subjective:    Patient ID: Donna Blankenship, female    DOB: 01-26-1965, 53 y.o.   MRN: [id number]  HPI The patient is here for an acute visit.   Sinus headaches affecting ears:    Medications and allergies reviewed with patient and updated if appropriate.  Patient Active Problem List   Diagnosis Date Noted  . Anxiety 12/20/2017  . Tingling in extremities 05/10/2017  . Anaphylactic shock due to adverse food reaction 12/24/2015  . Other allergic rhinitis 12/24/2015  . Lactose intolerance 12/24/2015  . Vitamin D deficiency 12/11/2015  . Gastroesophageal reflux disease 12/11/2015  . Axillary lump 08/29/2015  . Paresthesia of both hands 05/25/2015  . Hashimoto's thyroiditis 02/27/2015  . Elevated lipoprotein(a) 02/27/2015  . Insomnia 02/27/2015  . Thyroid nodule 12/19/2014  . Eczema 12/19/2014  . Food allergy 12/19/2014  . Hypercalcemia 12/19/2014  . Lichen planus 37/29/0211  . GAD (generalized anxiety disorder) 08/13/2014  . Dyspnea 04/20/2014  . Prediabetes 01/01/2014  . Hyperlipidemia 06/16/2013  . PULMONARY FIBROSIS, POSTINFLAMMATORY 09/20/2007  . COLONIC POLYPS, HX OF 10/21/2006    Current Outpatient Medications on File Prior to Visit  Medication Sig Dispense Refill  . blood glucose meter kit and supplies KIT Dispense based on patient and insurance preference. Use up to four times daily as directed. (FOR R73.03). 1 each 0  . busPIRone (BUSPAR) 5 MG tablet Take 1 tablet (5 mg total) by mouth 2 (two) times daily. 180 tablet 1  . diazepam (VALIUM) 5 MG tablet TAKE 1 TABLET BY MOUTH EVERY 12 HOURS AS NEEDED FOR ANXIETY 30 tablet 0  . eszopiclone (LUNESTA) 2 MG TABS tablet TAKE 1 TABLET BY MOUTH AT BEDTIME AS NEEDED FOR SLEEP.TAKE IMMEDIATELY BEFORE BEDTIME 30 tablet 0  . fexofenadine (ALLEGRA) 180 MG tablet Take 180 mg by mouth daily.    . hydroxychloroquine (PLAQUENIL) 200 MG tablet     . zolpidem (AMBIEN) 5 MG tablet Take 1 tablet (5 mg total) by mouth at bedtime as  needed for sleep. 30 tablet 0   No current facility-administered medications on file prior to visit.     Past Medical History:  Diagnosis Date  . Anemia    past H/o anemia  . Anxiety    on effexor and valium in the past  . Dyspnea    seeing pulm, hx lupus pneumonitis, mild pulm fibrosis  . Eczema   . Fibromyalgia 01/18/2008   Qualifier: Diagnosis of  By: Ronnald Ramp MD, Arvid Right.   Marland Kitchen GERD 01/11/2008   Qualifier: Diagnosis of  By: Ronnald Ramp MD, Arvid Right.   . H/O thyroid nodule 01/14/2016  . Hyperglycemia    borderline  . Infertility, female   . Thyroid disease    past h/o hypothyroidism    Past Surgical History:  Procedure Laterality Date  . ABDOMINAL HYSTERECTOMY  2004   for fibroid; retained ovaries  . PELVIC LAPAROSCOPY  1999    Social History   Socioeconomic History  . Marital status: Married    Spouse name: Not on file  . Number of children: Not on file  . Years of education: Not on file  . Highest education level: Not on file  Occupational History  . Not on file  Social Needs  . Financial resource strain: Not on file  . Food insecurity    Worry: Not on file    Inability: Not on file  . Transportation needs    Medical: Not on file    Non-medical: Not on file  Tobacco Use  . Smoking status: Never Smoker  . Smokeless tobacco: Never Used  Substance and Sexual Activity  . Alcohol use: Yes    Alcohol/week: 2.0 standard drinks    Types: 2 Standard drinks or equivalent per week    Comment: occ glass of wine  . Drug use: No  . Sexual activity: Yes    Partners: Male  Lifestyle  . Physical activity    Days per week: Not on file    Minutes per session: Not on file  . Stress: Not on file  Relationships  . Social Herbalist on phone: Not on file    Gets together: Not on file    Attends religious service: Not on file    Active member of club or organization: Not on file    Attends meetings of clubs or organizations: Not on file    Relationship status: Not on  file  Other Topics Concern  . Not on file  Social History Narrative   Work or School: not working      Home Situation: lives with husband      Spiritual Beliefs: Christian      Lifestyle: eats healthy, not exercising       Family History  Problem Relation Age of Onset  . Diabetes Mother   . Diabetes Father   . Stroke Father   . Anuerysm Father   . Sudden death Brother   . Heart attack Brother   . Diabetes Paternal Grandmother   . Breast cancer Maternal Grandmother   . Asthma Neg Hx   . Allergic rhinitis Neg Hx   . Immunodeficiency Neg Hx   . Urticaria Neg Hx   . Eczema Neg Hx   . Atopy Neg Hx   . Angioedema Neg Hx     Review of Systems     Objective:  There were no vitals filed for this visit. BP Readings from Last 3 Encounters:  12/20/17 118/78  10/20/17 118/72  08/09/17 134/86   Wt Readings from Last 3 Encounters:  12/20/17 160 lb (72.6 kg)  10/20/17 177 lb 12.8 oz (80.6 kg)  08/09/17 169 lb (76.7 kg)   There is no height or weight on file to calculate BMI.   Physical Exam         Assessment & Plan:    See Problem List for Assessment and Plan of chronic medical problems.

## 2018-11-30 ENCOUNTER — Ambulatory Visit: Payer: Self-pay | Admitting: Internal Medicine

## 2018-12-03 ENCOUNTER — Other Ambulatory Visit: Payer: Self-pay | Admitting: Internal Medicine

## 2018-12-04 NOTE — Progress Notes (Signed)
Virtual Visit via Video Note  I connected with Donna Blankenship on 12/05/18 at  9:30 AM EST by a video enabled telemedicine application and verified that I am speaking with the correct person using two identifiers.   I discussed the limitations of evaluation and management by telemedicine and the availability of in person appointments. The patient expressed understanding and agreed to proceed.  Present for the visit:  Myself, Dr Billey Gosling, Buren Kos.  The patient is currently at home and I am in the office.    No referring provider.    History of Present Illness:     Social History   Socioeconomic History  . Marital status: Married    Spouse name: Not on file  . Number of children: Not on file  . Years of education: Not on file  . Highest education level: Not on file  Occupational History  . Not on file  Social Needs  . Financial resource strain: Not on file  . Food insecurity    Worry: Not on file    Inability: Not on file  . Transportation needs    Medical: Not on file    Non-medical: Not on file  Tobacco Use  . Smoking status: Never Smoker  . Smokeless tobacco: Never Used  Substance and Sexual Activity  . Alcohol use: Yes    Alcohol/week: 2.0 standard drinks    Types: 2 Standard drinks or equivalent per week    Comment: occ glass of wine  . Drug use: No  . Sexual activity: Yes    Partners: Male  Lifestyle  . Physical activity    Days per week: Not on file    Minutes per session: Not on file  . Stress: Not on file  Relationships  . Social Herbalist on phone: Not on file    Gets together: Not on file    Attends religious service: Not on file    Active member of club or organization: Not on file    Attends meetings of clubs or organizations: Not on file    Relationship status: Not on file  Other Topics Concern  . Not on file  Social History Narrative   Work or School: not working      Home Situation: lives with husband      Spiritual  Beliefs: Christian      Lifestyle: eats healthy, not exercising        Observations/Objective: Appears well in NAD   Assessment and Plan:  See Problem List for Assessment and Plan of chronic medical problems.   Follow Up Instructions:    I discussed the assessment and treatment plan with the patient. The patient was provided an opportunity to ask questions and all were answered. The patient agreed with the plan and demonstrated an understanding of the instructions.   The patient was advised to call back or seek an in-person evaluation if the symptoms worsen or if the condition fails to improve as anticipated.    Binnie Rail, MD  This encounter was created in error - please disregard.

## 2018-12-05 ENCOUNTER — Encounter: Payer: BC Managed Care – PPO | Admitting: Internal Medicine

## 2018-12-05 NOTE — Progress Notes (Signed)
Virtual Visit via Video Note  I connected with Donna Blankenship on 12/05/18 at  1:00 PM EST by a video enabled telemedicine application and verified that I am speaking with the correct person using two identifiers.   I discussed the limitations of evaluation and management by telemedicine and the availability of in person appointments. The patient expressed understanding and agreed to proceed.  Present for the visit:  Myself, Dr Billey Gosling, Buren Kos.  The patient is currently at home and I am in the office.    No referring provider.    History of Present Illness: She is here for an acute visit for anxiety.   She has been experiencing increased anxiety.  She will be losing her insurance and that is causing a lot of anxiety.  She is also had some deaths in the family and of course with the Covid pandemic there is increased anxiety.  She has been taking her anxiety medications more consistently and they are helping.  She feels her anxiety is controlled, but wants refills of her medication.  She is also having increased burning in her neck and scalp.  It is her autoimmune disease, which she knows is likely related to the increase in stress.  She is trying to work on what insurance she will have next year and how should build pay for her medications.     Social History   Socioeconomic History  . Marital status: Married    Spouse name: Not on file  . Number of children: Not on file  . Years of education: Not on file  . Highest education level: Not on file  Occupational History  . Not on file  Social Needs  . Financial resource strain: Not on file  . Food insecurity    Worry: Not on file    Inability: Not on file  . Transportation needs    Medical: Not on file    Non-medical: Not on file  Tobacco Use  . Smoking status: Never Smoker  . Smokeless tobacco: Never Used  Substance and Sexual Activity  . Alcohol use: Yes    Alcohol/week: 2.0 standard drinks    Types: 2 Standard  drinks or equivalent per week    Comment: occ glass of wine  . Drug use: No  . Sexual activity: Yes    Partners: Male  Lifestyle  . Physical activity    Days per week: Not on file    Minutes per session: Not on file  . Stress: Not on file  Relationships  . Social Herbalist on phone: Not on file    Gets together: Not on file    Attends religious service: Not on file    Active member of club or organization: Not on file    Attends meetings of clubs or organizations: Not on file    Relationship status: Not on file  Other Topics Concern  . Not on file  Social History Narrative   Work or School: not working      Home Situation: lives with husband      Spiritual Beliefs: Christian      Lifestyle: eats healthy, not exercising        Observations/Objective: Appears well in NAD Breathing normally Skin appears warm and dry Slightly anxious and upset mostly because of losing her insurance, which is causing the anxiety Thought process and judgment normal  Assessment and Plan:  See Problem List for Assessment and Plan of chronic medical problems.  Follow Up Instructions:    I discussed the assessment and treatment plan with the patient. The patient was provided an opportunity to ask questions and all were answered. The patient agreed with the plan and demonstrated an understanding of the instructions.   The patient was advised to call back or seek an in-person evaluation if the symptoms worsen or if the condition fails to improve as anticipated.    Binnie Rail, MD

## 2018-12-06 ENCOUNTER — Encounter: Payer: Self-pay | Admitting: Internal Medicine

## 2018-12-06 ENCOUNTER — Ambulatory Visit (INDEPENDENT_AMBULATORY_CARE_PROVIDER_SITE_OTHER): Payer: BC Managed Care – PPO | Admitting: Internal Medicine

## 2018-12-06 DIAGNOSIS — F419 Anxiety disorder, unspecified: Secondary | ICD-10-CM | POA: Diagnosis not present

## 2018-12-06 MED ORDER — DIAZEPAM 5 MG PO TABS: 5.0000 mg | ORAL_TABLET | Freq: Two times a day (BID) | ORAL | 0 refills | Status: AC | PRN

## 2018-12-06 MED ORDER — BUSPIRONE HCL 5 MG PO TABS: 5.0000 mg | ORAL_TABLET | Freq: Two times a day (BID) | ORAL | 1 refills | Status: AC

## 2018-12-06 NOTE — Assessment & Plan Note (Signed)
Experiencing increased anxiety related to losing her insurance soon and not knowing what she will do for healthcare and new prescriptions, deaths in the family Taking BuSpar-continue.  Dose can be increased in the near future if needed Taking diazepam as needed Will refill both We will look into other places to obtain care that may have help with payment

## 2018-12-18 ENCOUNTER — Other Ambulatory Visit: Payer: Self-pay | Admitting: Internal Medicine

## 2018-12-19 NOTE — Telephone Encounter (Signed)
[place] Controlled Database Checked Last filled: 11/08/18 # 30 LOV w/you: 12/06/18 Next appt w/you: None

## 2019-01-30 ENCOUNTER — Other Ambulatory Visit: Payer: Self-pay | Admitting: Nurse Practitioner

## 2019-01-30 DIAGNOSIS — Z1231 Encounter for screening mammogram for malignant neoplasm of breast: Secondary | ICD-10-CM

## 2019-03-06 ENCOUNTER — Ambulatory Visit: Payer: BC Managed Care – PPO

## 2023-02-12 ENCOUNTER — Other Ambulatory Visit (HOSPITAL_BASED_OUTPATIENT_CLINIC_OR_DEPARTMENT_OTHER): Payer: Self-pay | Admitting: Cardiology

## 2023-02-12 DIAGNOSIS — Z8249 Family history of ischemic heart disease and other diseases of the circulatory system: Secondary | ICD-10-CM

## 2023-02-16 ENCOUNTER — Inpatient Hospital Stay (HOSPITAL_COMMUNITY): Admission: RE | Admit: 2023-02-16 | Payer: BC Managed Care – PPO | Source: Ambulatory Visit

## 2023-02-23 ENCOUNTER — Ambulatory Visit (HOSPITAL_COMMUNITY)
Admission: RE | Admit: 2023-02-23 | Discharge: 2023-02-23 | Disposition: A | Discharge status: Home / Self Care | Payer: Self-pay | Source: Ambulatory Visit | Attending: Cardiology | Admitting: Cardiology

## 2023-02-23 ENCOUNTER — Other Ambulatory Visit (HOSPITAL_COMMUNITY): Payer: Self-pay

## 2023-02-23 DIAGNOSIS — Z8249 Family history of ischemic heart disease and other diseases of the circulatory system: Secondary | ICD-10-CM | POA: Insufficient documentation

## 2023-07-22 ENCOUNTER — Other Ambulatory Visit: Payer: Self-pay

## 2023-07-22 ENCOUNTER — Emergency Department (HOSPITAL_COMMUNITY)
Admission: EM | Admit: 2023-07-22 | Discharge: 2023-07-23 | Disposition: A | Discharge status: Home / Self Care | Attending: Emergency Medicine | Admitting: Emergency Medicine

## 2023-07-22 ENCOUNTER — Encounter (HOSPITAL_COMMUNITY): Payer: Self-pay

## 2023-07-22 ENCOUNTER — Emergency Department (HOSPITAL_COMMUNITY)

## 2023-07-22 DIAGNOSIS — R4701 Aphasia: Secondary | ICD-10-CM | POA: Insufficient documentation

## 2023-07-22 DIAGNOSIS — Y900 Blood alcohol level of less than 20 mg/100 ml: Secondary | ICD-10-CM | POA: Diagnosis not present

## 2023-07-22 DIAGNOSIS — R4781 Slurred speech: Secondary | ICD-10-CM | POA: Insufficient documentation

## 2023-07-22 DIAGNOSIS — I609 Nontraumatic subarachnoid hemorrhage, unspecified: Secondary | ICD-10-CM

## 2023-07-22 DIAGNOSIS — Z91040 Latex allergy status: Secondary | ICD-10-CM | POA: Insufficient documentation

## 2023-07-22 DIAGNOSIS — R42 Dizziness and giddiness: Secondary | ICD-10-CM | POA: Diagnosis not present

## 2023-07-22 DIAGNOSIS — Z91010 Allergy to peanuts: Secondary | ICD-10-CM | POA: Diagnosis not present

## 2023-07-22 DIAGNOSIS — I671 Cerebral aneurysm, nonruptured: Secondary | ICD-10-CM

## 2023-07-22 LAB — I-STAT CHEM 8, ED
BUN: 23 mg/dL — ABNORMAL HIGH (ref 6–20)
Calcium, Ion: 1.23 mmol/L (ref 1.15–1.40)
Chloride: 100 mmol/L (ref 98–111)
Creatinine, Ser: 1.5 mg/dL — ABNORMAL HIGH (ref 0.44–1.00)
Glucose, Bld: 203 mg/dL — ABNORMAL HIGH (ref 70–99)
HCT: 36 % (ref 36.0–46.0)
Hemoglobin: 12.2 g/dL (ref 12.0–15.0)
Potassium: 3.9 mmol/L (ref 3.5–5.1)
Sodium: 136 mmol/L (ref 135–145)
TCO2: 24 mmol/L (ref 22–32)

## 2023-07-22 LAB — DIFFERENTIAL
Abs Immature Granulocytes: 0.03 K/uL (ref 0.00–0.07)
Basophils Absolute: 0.1 K/uL (ref 0.0–0.1)
Basophils Relative: 1 %
Eosinophils Absolute: 0.3 K/uL (ref 0.0–0.5)
Eosinophils Relative: 4 %
Immature Granulocytes: 0 %
Lymphocytes Relative: 17 %
Lymphs Abs: 1.5 K/uL (ref 0.7–4.0)
Monocytes Absolute: 0.7 K/uL (ref 0.1–1.0)
Monocytes Relative: 8 %
Neutro Abs: 6.3 K/uL (ref 1.7–7.7)
Neutrophils Relative %: 70 %

## 2023-07-22 LAB — COMPREHENSIVE METABOLIC PANEL WITH GFR
ALT: 14 U/L (ref 0–44)
AST: 24 U/L (ref 15–41)
Albumin: 4.6 g/dL (ref 3.5–5.0)
Alkaline Phosphatase: 83 U/L (ref 38–126)
Anion gap: 10 (ref 5–15)
BUN: 24 mg/dL — ABNORMAL HIGH (ref 6–20)
CO2: 25 mmol/L (ref 22–32)
Calcium: 10.4 mg/dL — ABNORMAL HIGH (ref 8.9–10.3)
Chloride: 102 mmol/L (ref 98–111)
Creatinine, Ser: 1.34 mg/dL — ABNORMAL HIGH (ref 0.44–1.00)
GFR, Estimated: 47 mL/min — ABNORMAL LOW (ref >60)
Glucose, Bld: 212 mg/dL — ABNORMAL HIGH (ref 70–99)
Potassium: 3.8 mmol/L (ref 3.5–5.1)
Sodium: 137 mmol/L (ref 135–145)
Total Bilirubin: 1 mg/dL (ref 0.0–1.2)
Total Protein: 8.4 g/dL — ABNORMAL HIGH (ref 6.5–8.1)

## 2023-07-22 LAB — CBC
HCT: 39.2 % (ref 36.0–46.0)
Hemoglobin: 13.2 g/dL (ref 12.0–15.0)
MCH: 28.6 pg (ref 26.0–34.0)
MCHC: 33.7 g/dL (ref 30.0–36.0)
MCV: 84.8 fL (ref 80.0–100.0)
Platelets: 178 K/uL (ref 150–400)
RBC: 4.62 MIL/uL (ref 3.87–5.11)
RDW: 14 % (ref 11.5–15.5)
WBC: 8.9 K/uL (ref 4.0–10.5)
nRBC: 0 % (ref 0.0–0.2)

## 2023-07-22 LAB — ETHANOL: Alcohol, Ethyl (B): <15 mg/dL (ref <15)

## 2023-07-22 LAB — PROTIME-INR
INR: 1 (ref 0.8–1.2)
Prothrombin Time: 13.4 s (ref 11.4–15.2)

## 2023-07-22 LAB — APTT: aPTT: 26 s (ref 24–36)

## 2023-07-22 MED ORDER — IOHEXOL 350 MG/ML SOLN
75.0000 mL | Freq: Once | INTRAVENOUS | Status: AC | PRN
  Administered 2023-07-22: 75 mL via INTRAVENOUS

## 2023-07-22 NOTE — ED Provider Notes (Addendum)
 [facility] AT [facility] Provider Note   CSN: [id number] Arrival date & time: 07/22/23  1708     Patient presents with: Aphasia   Donna Blankenship is a 58 y.o. female.   Patient arrives with strokelike symptoms.  About an hour and a half ago she had difficulty getting words out slurred speech lasted several minutes now resolved.  She has significant family history of cardiac disease.  She has a history of anxiety fibromyalgia.  She states that a couple weeks ago she was visiting [place]  had a similar episode that she felt was more heat related where she was dizzy flushed.  But today she had some speech issues slurred speech difficulty getting words out and felt dizzy.  She did not have any weakness numbness or headache.  She feels like she is back to her normal now.  The history is provided by the patient.       Prior to Admission medications   Medication Sig Start Date End Date Taking? Authorizing Provider  albuterol (VENTOLIN HFA) 108 (90 Base) MCG/ACT inhaler Inhale 2 puffs into the lungs every 6 (six) hours as needed for wheezing or shortness of breath.   Yes [provider]  ALPHA-LIPOIC ACID PO Take 1 capsule by mouth daily after supper.   Yes [provider]  Chromium Picolinate (CHROMIUM PICOLATE PO) Take 1 tablet by mouth daily after supper.   Yes [provider]  escitalopram  (LEXAPRO ) 20 MG tablet Take 20 mg by mouth daily.   Yes [provider]  Multiple Vitamin (MULTIVITAMIN) tablet Take 1 tablet by mouth daily after supper.   Yes [provider]  NONFORMULARY OR COMPOUNDED ITEM Take 1 capsule by mouth See admin instructions. Mellein leaf extract capsules- Take 1 capsule by mouth after supper   Yes [provider]  SPIRIVA RESPIMAT 2.5 MCG/ACT AERS Inhale 2 puffs into the lungs daily.   Yes [provider]  Vitamin D -Vitamin K (K2-D3 5000 PO) Take 2 capsules by mouth See admin  instructions. Vitamin K-2 200 mcg/Vitamin D -3 5,000 units - Take 2 capsules by mouth after supper   Yes [provider]  ACCU-CHEK GUIDE test strip CHECK SUGAR UP TO 4 TIMES A DAY 12/05/18   Burns, Glade PARAS, MD  blood glucose meter kit and supplies KIT Dispense based on patient and insurance preference. Use up to four times daily as directed. (FOR R73.03). 12/20/17   Geofm Glade PARAS, MD  busPIRone  (BUSPAR ) 5 MG tablet Take 1 tablet (5 mg total) by mouth 2 (two) times daily. Patient not taking: Reported on 07/22/2023 12/06/18   Geofm Glade PARAS, MD  diazepam  (VALIUM ) 5 MG tablet Take 1 tablet (5 mg total) by mouth every 12 (twelve) hours as needed. for anxiety Patient not taking: Reported on 07/22/2023 12/06/18   Geofm Glade PARAS, MD  eszopiclone  (LUNESTA ) 2 MG TABS tablet TAKE 1 TABLET BY MOUTH AT BEDTIME AS NEEDED FOR SLEEP.TAKE IMMEDIATELY BEFORE BEDTIME Patient not taking: Reported on 07/22/2023 12/19/18   Geofm Glade PARAS, MD    Allergies: Penicillins, Walnut, Flaxseed (linseed), Latex, Peanut-containing drug products, Shellfish-derived products, Klar, Egg-derived products, Gluten meal, Milk-related compounds, Other, Sesame oil, Sesame seed extract allergy  skin test, Shellfish allergy , Bupropion, and Sulfa antibiotics    Review of Systems  Updated Vital Signs BP 125/75   Pulse 70   Temp 98.1 F (36.7 C) (Axillary)   Resp 20   LMP 01/05/2002   SpO2 96%  Physical Exam Vitals and nursing note reviewed.  Constitutional:      General: She is not in acute distress.    Appearance: She is well-developed. She is not ill-appearing.  HENT:     Head: Normocephalic and atraumatic.     Nose: Nose normal.     Mouth/Throat:     Mouth: Mucous membranes are moist.  Eyes:     Extraocular Movements: Extraocular movements intact.     Conjunctiva/sclera: Conjunctivae normal.     Pupils: Pupils are equal, round, and reactive to light.  Cardiovascular:     Rate and Rhythm: Normal rate and regular  rhythm.     Pulses: Normal pulses.     Heart sounds: Normal heart sounds. No murmur heard. Pulmonary:     Effort: Pulmonary effort is normal. No respiratory distress.     Breath sounds: Normal breath sounds.  Abdominal:     Palpations: Abdomen is soft.     Tenderness: There is no abdominal tenderness.  Musculoskeletal:        General: No swelling.     Cervical back: Normal range of motion and neck supple.  Skin:    General: Skin is warm and dry.     Capillary Refill: Capillary refill takes less than 2 seconds.  Neurological:     General: No focal deficit present.     Mental Status: She is alert and oriented to person, place, and time.     Cranial Nerves: No cranial nerve deficit.     Sensory: No sensory deficit.     Motor: No weakness.     Coordination: Coordination normal.     Comments: 5+ out of 5 strength, normal sensation, no drift, normal finger-nose-finger, normal speech normal coordination normal visual fields  Psychiatric:        Mood and Affect: Mood normal.     (all labs ordered are listed, but only abnormal results are displayed) Labs Reviewed  COMPREHENSIVE METABOLIC PANEL WITH GFR - Abnormal; Notable for the following components:      Result Value   Glucose, Bld 212 (*)    BUN 24 (*)    Creatinine, Ser 1.34 (*)    Calcium 10.4 (*)    Total Protein 8.4 (*)    GFR, Estimated 47 (*)    All other components within normal limits  I-STAT CHEM 8, ED - Abnormal; Notable for the following components:   BUN 23 (*)    Creatinine, Ser 1.50 (*)    Glucose, Bld 203 (*)    All other components within normal limits  ETHANOL  PROTIME-INR  APTT  CBC  DIFFERENTIAL  RAPID URINE DRUG SCREEN, HOSP PERFORMED    EKG: EKG Interpretation Date/Time:  Thursday July 22 2023 17:21:27 EDT Ventricular Rate:  91 PR Interval:  167 QRS Duration:  78 QT Interval:  397 QTC Calculation: 489 R Axis:   19  Text Interpretation: Sinus rhythm Confirmed by Ruthe Cornet [phone]) on  07/22/2023 5:24:57 PM  Radiology: CT ANGIO HEAD NECK W WO CM Result Date: 07/22/2023 CLINICAL DATA:  Neuro deficit, acute, stroke suspected EXAM: CT ANGIOGRAPHY HEAD AND NECK WITH AND WITHOUT CONTRAST TECHNIQUE: Multidetector CT imaging of the head and neck was performed using the standard protocol during bolus administration of intravenous contrast. Multiplanar CT image reconstructions and MIPs were obtained to evaluate the vascular anatomy. Carotid stenosis measurements (when applicable) are obtained utilizing NASCET criteria, using the distal internal carotid diameter as the denominator. RADIATION DOSE REDUCTION: This exam was performed  according to the departmental dose-optimization program which includes automated exposure control, adjustment of the mA and/or kV according to patient size and/or use of iterative reconstruction technique. CONTRAST:  75mL OMNIPAQUE  IOHEXOL  350 MG/ML SOLN COMPARISON:  None Available. FINDINGS: CT HEAD FINDINGS Brain: Limited study. Subtle hyperdensity along the left frontal convexity that could represent small volume of extra-axial hemorrhage or artifact. No evidence of acute infarction, hydrocephalus, extra-axial collection or mass lesion/mass effect. Vascular: See below. Skull: No acute fracture. Sinuses/Orbits: Clear sinuses.  No acute orbital findings. Review of the MIP images confirms the above findings CTA NECK FINDINGS Aortic arch: Great vessel origins are patent without significant stenosis. Right carotid system: No evidence of dissection, stenosis (50% or greater), or occlusion. Left carotid system: No evidence of dissection, stenosis (50% or greater), or occlusion. Vertebral arteries: Left dominant. No evidence of dissection, stenosis (50% or greater), or occlusion. Skeleton: No acute abnormality on limited assessment. Other neck: No acute abnormality on limited assessment. Upper chest: Visualized lung apices are clear. Review of the MIP images confirms the above  findings CTA HEAD FINDINGS Anterior circulation: Hypoplastic right A1 ACA, anatomic variant. Bilateral intracranial ICAs, MCAs, and ACAs are patent without proximal hemodynamically significant stenosis. Approximately 2-3 mm outpouching at the left MCA bifurcation, compatible with aneurysm (see series 15, image 2 L5 and series 16, image 81). Posterior circulation: Bilateral intradural vertebral arteries, basilar artery and bilateral posterior cerebral arteries are patent without proximal hemodynamically significant stenosis. No aneurysm identified. Venous sinuses: As permitted by contrast timing, patent. Review of the MIP images confirms the above findings IMPRESSION: 1. 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 2. The CT head is a limited study with subtle hyperdensity along the left frontal convexity that could represent small volume of extra-axial hemorrhage or artifact. Recommend short interval follow-up noncontrast CT head to assess for persistence. No significant mass effect. 3. No large vessel occlusion or proximal hemodynamically significant stenosis. Findings discussed with Dr. RUTHE via telephone at 9:10 p.m. Electronically Signed   By: Gilmore GORMAN Molt M.D.   On: 07/22/2023 21:10     Procedures   Medications Ordered in the ED  iohexol  (OMNIPAQUE ) 350 MG/ML injection 75 mL (75 mLs Intravenous Contrast Given 07/22/23 1933)                                    Medical Decision Making Amount and/or Complexity of Data Reviewed Labs: ordered. Radiology: ordered.  Risk Prescription drug management.   Barrie Sigmund Menton is here with focal like symptoms.  History of fibromyalgia anxiety.  EKG shows sinus rhythm.  No ischemic changes.  About an hour and a half ago she had several minutes of slurred speech difficulty getting words out and dizziness that now resolved.  She had an episode a couple weeks ago when she was visiting [place]  where she felt like she had heat related symptoms.   She has cardiac history in the family.  She has a normal neurological exam now.  Differential diagnosis possibly TIA versus anxiety stress related process.  Will check basic labs CTA of head and neck and discussed case with neurology.  Overall blood work shows no significant leukocytosis anemia or electrolyte abnormality.  Creatinine is 1.3.  No leukocytosis.  Radiology called me and CTA shows a 2 to 3 mm left MCA bifurcation aneurysm.  CT scan overall somewhat poor quality but does show subtle hyperdensity along the left  frontal convexity which could represent a small volume of extraocular hemorrhage or artifact.  After talking with radiology and neurology with Dr. Versie, we will do a short interval CT follow-up at 6 hours to reevaluate this area.  Overall her ABCD2 score is 2.  Neurology recommends getting the CT scan and discussing with neurosurgery.  Given the ABCD2 score would recommend outpatient stroke follow-up if cleared by neurosurgery.  Patient handed off to oncoming ED staff.  Patient made aware of the plan. Talked with [facility], [place] aware on plan.  This chart was dictated using voice recognition software.  Despite best efforts to proofread,  errors can occur which can change the documentation meaning.      Final diagnoses:  Slurred speech  Dizziness    ED Discharge Orders     None          Ruthe Cornet, DO 07/22/23 2250    Ruthe Cornet, DO 07/22/23 2338    Ruthe Cornet, DO 07/22/23 2340

## 2023-07-22 NOTE — ED Triage Notes (Signed)
 Patient in today reporting stroke like symptoms. Reports that she was having difficulty getting words out, slurred speech.

## 2023-07-23 ENCOUNTER — Emergency Department (HOSPITAL_COMMUNITY)

## 2023-07-23 MED ORDER — LEVETIRACETAM (KEPPRA) 500 MG/5 ML ADULT IV PUSH
1000.0000 mg | Freq: Once | INTRAVENOUS | Status: AC
  Administered 2023-07-23: 1000 mg via INTRAVENOUS
  Filled 2023-07-23: qty 10

## 2023-07-23 MED ORDER — LEVETIRACETAM 500 MG PO TABS: 500.0000 mg | ORAL_TABLET | Freq: Two times a day (BID) | ORAL | 0 refills | Status: AC

## 2023-07-23 NOTE — ED Provider Notes (Signed)
 2:24 AM Case d/w Dr. Mavis of neurosurgery.  He has reviewed repeat imaging.  Please load and start Keppra and have patient follow up with neurosurgery as an outpatient.    Stable for discharge.  Strict return precautions given.  No driving until cleared.     Kristine Chahal, MD 07/23/23 MON[place]

## 2024-02-11 ENCOUNTER — Encounter: Payer: Self-pay | Admitting: *Deleted

## 2024-02-11 ENCOUNTER — Other Ambulatory Visit (HOSPITAL_COMMUNITY): Payer: Self-pay | Admitting: Cardiology

## 2024-02-11 DIAGNOSIS — Z8249 Family history of ischemic heart disease and other diseases of the circulatory system: Secondary | ICD-10-CM

## 2024-02-11 LAB — POCT ABI - SCREENING FOR PILOT NO CHARGE
Left ABI: 1.2
Right ABI: 1.19

## 2024-02-11 LAB — AMB RESULTS CONSOLE CBG: Glucose: 151

## 2024-02-11 NOTE — Progress Notes (Unsigned)
 The patient attended the [facility] Heart Event on 02/11/2024 where she was screened for bp, blood glucose, ABI, smoking, and SDOH needs. Her bp was 133/78, her ABI was right 1.19, left 1.20, her non-fasting blood glucose was 151 mg/dl and screening notes indicates she had coffee with Stevia this morning. She does not have any SDOH needs indicated. She did not indicate a PCP but chart encounters shows that she is established under an [facility] provider, she has private/[place] for insurance, she is not a smoker. Pt's address and number was not provided at the event.

## 2024-02-23 ENCOUNTER — Other Ambulatory Visit (HOSPITAL_BASED_OUTPATIENT_CLINIC_OR_DEPARTMENT_OTHER)
